# Patient Record
Sex: Female | Born: 1935 | Race: White | Hispanic: No | Marital: Married | Smoking: Never smoker
Health system: Southern US, Community
[De-identification: ages and names within clinical notes are randomized; demographics above are authoritative.]

## PROBLEM LIST (undated history)

## (undated) DIAGNOSIS — I8290 Acute embolism and thrombosis of unspecified vein: Secondary | ICD-10-CM

## (undated) DIAGNOSIS — E119 Type 2 diabetes mellitus without complications: Secondary | ICD-10-CM

## (undated) DIAGNOSIS — I10 Essential (primary) hypertension: Secondary | ICD-10-CM

## (undated) DIAGNOSIS — M1990 Unspecified osteoarthritis, unspecified site: Secondary | ICD-10-CM

## (undated) DIAGNOSIS — K589 Irritable bowel syndrome without diarrhea: Secondary | ICD-10-CM

## (undated) DIAGNOSIS — I219 Acute myocardial infarction, unspecified: Secondary | ICD-10-CM

## (undated) HISTORY — DX: Acute myocardial infarction, unspecified: I21.9

## (undated) HISTORY — PX: BACK SURGERY: SHX140

## (undated) HISTORY — PX: CHOLECYSTECTOMY: SHX55

## (undated) HISTORY — PX: COLONOSCOPY: SHX174

## (undated) HISTORY — PX: JOINT REPLACEMENT: SHX530

## (undated) HISTORY — PX: ABDOMINAL HYSTERECTOMY: SHX81

## (undated) HISTORY — PX: FOOT SURGERY: SHX648

## (undated) HISTORY — PX: TONSILLECTOMY: SUR1361

## (undated) HISTORY — DX: Essential (primary) hypertension: I10

## (undated) HISTORY — DX: Acute embolism and thrombosis of unspecified vein: I82.90

## (undated) HISTORY — PX: BREAST BIOPSY: SHX20

## (undated) HISTORY — DX: Irritable bowel syndrome, unspecified: K58.9

## (undated) HISTORY — DX: Type 2 diabetes mellitus without complications: E11.9

## (undated) HISTORY — PX: APPENDECTOMY: SHX54

---

## 1971-05-30 DIAGNOSIS — E119 Type 2 diabetes mellitus without complications: Secondary | ICD-10-CM

## 1971-05-30 HISTORY — DX: Type 2 diabetes mellitus without complications: E11.9

## 1982-05-29 HISTORY — PX: COLON SURGERY: SHX602

## 1986-05-29 DIAGNOSIS — I10 Essential (primary) hypertension: Secondary | ICD-10-CM

## 1986-05-29 HISTORY — DX: Essential (primary) hypertension: I10

## 2003-04-22 ENCOUNTER — Other Ambulatory Visit: Payer: Self-pay

## 2003-07-14 ENCOUNTER — Other Ambulatory Visit: Payer: Self-pay

## 2003-07-15 ENCOUNTER — Other Ambulatory Visit: Payer: Self-pay

## 2003-07-30 ENCOUNTER — Inpatient Hospital Stay (HOSPITAL_COMMUNITY): Admission: AD | Admit: 2003-07-30 | Discharge: 2003-08-10 | Payer: Self-pay | Admitting: Neurosurgery

## 2003-08-10 ENCOUNTER — Inpatient Hospital Stay (HOSPITAL_COMMUNITY)
Admission: RE | Admit: 2003-08-10 | Discharge: 2003-08-27 | Payer: Self-pay | Admitting: Physical Medicine & Rehabilitation

## 2004-11-04 IMAGING — CR DG RIBS W/ CHEST 3+V*L*
3 series · 3 of 3 positions shown · non-contrast
Comparison: 08/12/03.

CLINICAL DATA: Increase in chest wall pain after a fall.  
 CHEST WITH LEFT RIBS

[view not recorded (1 of 3)]
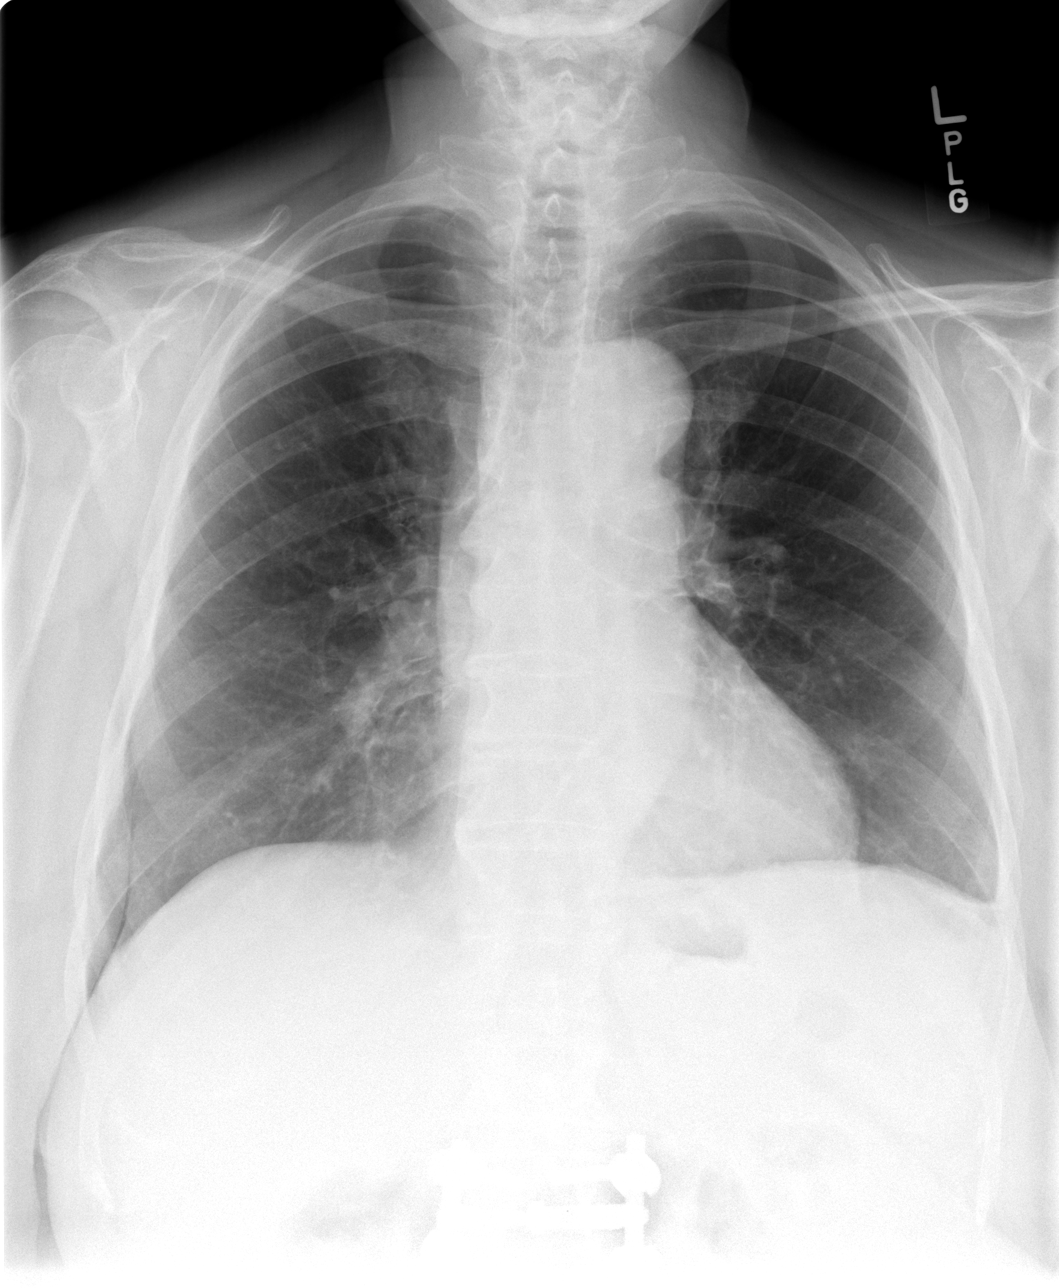

[view not recorded (2 of 3)]
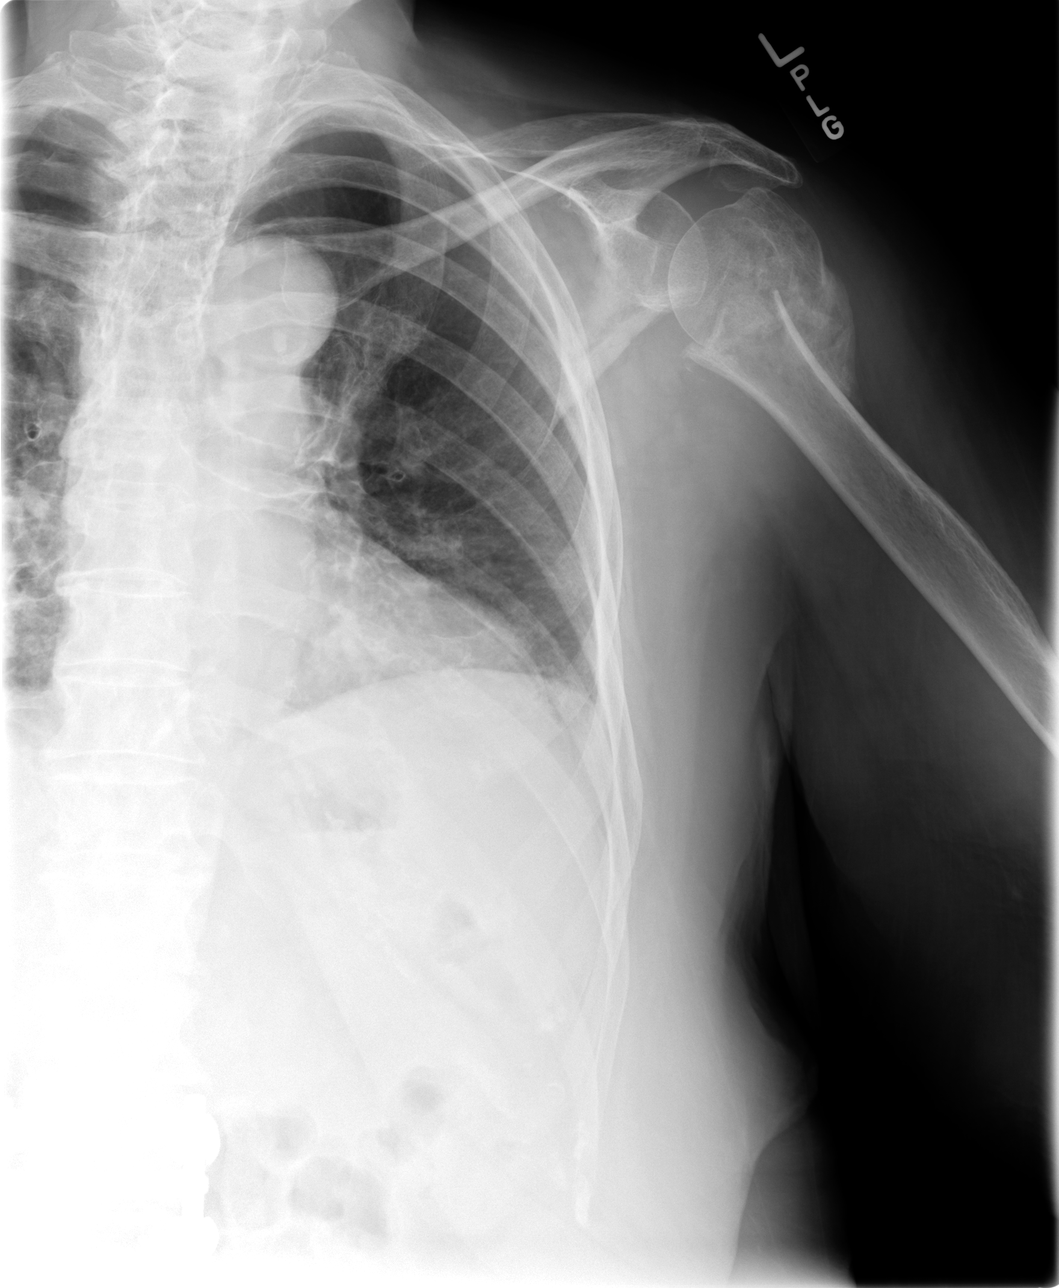

[view not recorded (3 of 3)]
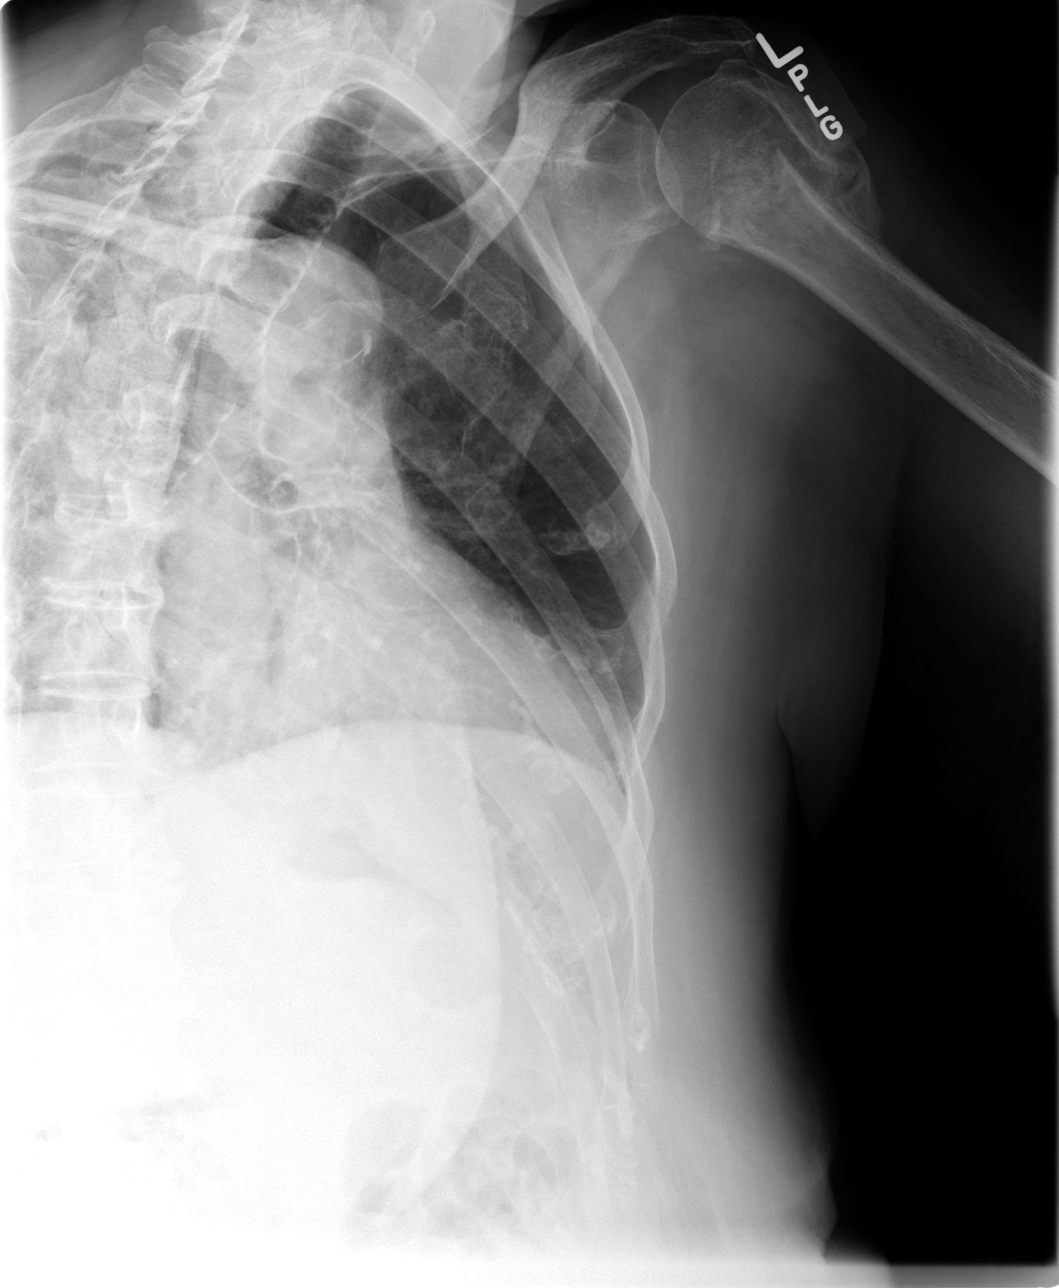

[3 of 3 positions shown; findings below may reference images not displayed]

Heart size is normal.  The mediastinum is unremarkable.  The lungs are clear except that there may be a small amount of pleural fluid on the left and minimal left base atelectasis.  No abnormality is seen in the right chest.  
 Rib details on the left show no definable fracture.  The patient is seen to have a healing fracture of the proximal humerus on the left.  
 IMPRESSION
 No rib fracture is seen.  The patient does have what I think is minimal atelectasis at the left base which raises concern about an occult rib fracture.

## 2005-04-19 ENCOUNTER — Ambulatory Visit: Payer: Self-pay | Admitting: Endocrinology

## 2005-07-31 ENCOUNTER — Inpatient Hospital Stay: Payer: Self-pay | Admitting: Endocrinology

## 2005-08-01 ENCOUNTER — Other Ambulatory Visit: Payer: Self-pay

## 2005-10-28 ENCOUNTER — Emergency Department: Payer: Self-pay | Admitting: Emergency Medicine

## 2005-10-29 ENCOUNTER — Other Ambulatory Visit: Payer: Self-pay

## 2006-01-18 ENCOUNTER — Emergency Department: Payer: Self-pay | Admitting: Emergency Medicine

## 2006-01-18 ENCOUNTER — Other Ambulatory Visit: Payer: Self-pay

## 2006-02-26 ENCOUNTER — Ambulatory Visit: Payer: Self-pay | Admitting: General Surgery

## 2006-04-22 ENCOUNTER — Emergency Department: Payer: Self-pay | Admitting: Emergency Medicine

## 2006-05-28 ENCOUNTER — Inpatient Hospital Stay: Payer: Self-pay | Admitting: Unknown Physician Specialty

## 2006-05-29 ENCOUNTER — Other Ambulatory Visit: Payer: Self-pay

## 2006-06-06 ENCOUNTER — Other Ambulatory Visit: Payer: Self-pay

## 2006-06-06 ENCOUNTER — Inpatient Hospital Stay: Payer: Self-pay | Admitting: Endocrinology

## 2006-06-17 ENCOUNTER — Encounter: Payer: Self-pay | Admitting: Internal Medicine

## 2006-06-29 ENCOUNTER — Encounter: Payer: Self-pay | Admitting: Endocrinology

## 2006-12-12 ENCOUNTER — Emergency Department: Payer: Self-pay | Admitting: Emergency Medicine

## 2007-02-27 ENCOUNTER — Inpatient Hospital Stay: Payer: Self-pay | Admitting: Endocrinology

## 2007-02-27 ENCOUNTER — Other Ambulatory Visit: Payer: Self-pay

## 2007-02-28 ENCOUNTER — Other Ambulatory Visit: Payer: Self-pay

## 2007-03-01 ENCOUNTER — Other Ambulatory Visit: Payer: Self-pay

## 2007-03-08 ENCOUNTER — Encounter: Payer: Self-pay | Admitting: Endocrinology

## 2007-03-30 ENCOUNTER — Encounter: Payer: Self-pay | Admitting: Endocrinology

## 2007-04-29 ENCOUNTER — Encounter: Payer: Self-pay | Admitting: Endocrinology

## 2007-05-30 ENCOUNTER — Encounter: Payer: Self-pay | Admitting: Endocrinology

## 2007-05-30 HISTORY — PX: FRACTURE SURGERY: SHX138

## 2007-08-09 ENCOUNTER — Emergency Department: Payer: Self-pay | Admitting: Emergency Medicine

## 2007-08-12 ENCOUNTER — Emergency Department: Payer: Self-pay | Admitting: Emergency Medicine

## 2008-01-01 ENCOUNTER — Other Ambulatory Visit: Payer: Self-pay

## 2008-01-02 ENCOUNTER — Inpatient Hospital Stay: Payer: Self-pay | Admitting: Endocrinology

## 2008-01-02 ENCOUNTER — Other Ambulatory Visit: Payer: Self-pay

## 2008-01-23 ENCOUNTER — Inpatient Hospital Stay: Payer: Self-pay | Admitting: Vascular Surgery

## 2008-01-23 ENCOUNTER — Other Ambulatory Visit: Payer: Self-pay

## 2008-01-30 ENCOUNTER — Ambulatory Visit: Payer: Self-pay | Admitting: Vascular Surgery

## 2008-02-02 ENCOUNTER — Emergency Department: Payer: Self-pay | Admitting: Emergency Medicine

## 2008-05-29 HISTORY — PX: CARPAL TUNNEL RELEASE: SHX101

## 2008-05-29 HISTORY — PX: FRACTURE SURGERY: SHX138

## 2008-07-31 ENCOUNTER — Ambulatory Visit: Payer: Self-pay | Admitting: Endocrinology

## 2009-02-26 ENCOUNTER — Ambulatory Visit: Payer: Self-pay | Admitting: Specialist

## 2009-04-07 IMAGING — CT CT ANGIO EXTREM LOW BILAT
1 of 3 series · 11 of 32 positions shown, 16 images · non-contrast
Comparison: none

REASON FOR EXAM: lle pain and numbness
COMMENTS:

[Series 11: upperrunoff 5.0 upper · axial · 0.84mm/px · z∈[-539,+176]mm · 11 of 167 slices shown, 16 images]
[im 12/167  soft-tissue]
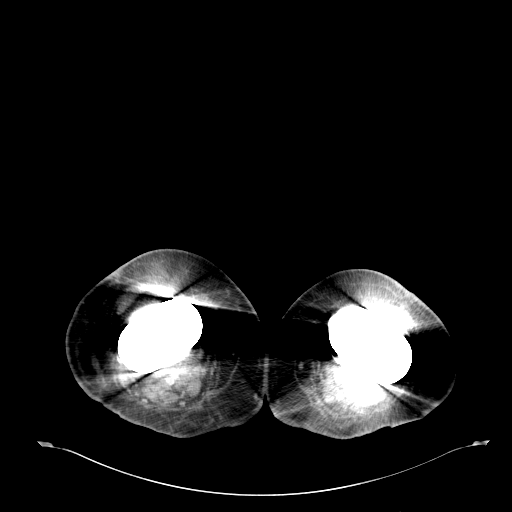
[im 12/167  bone]
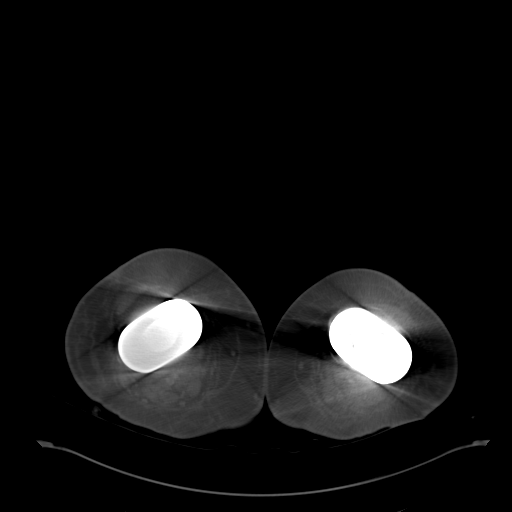
[im 34/167  soft-tissue]
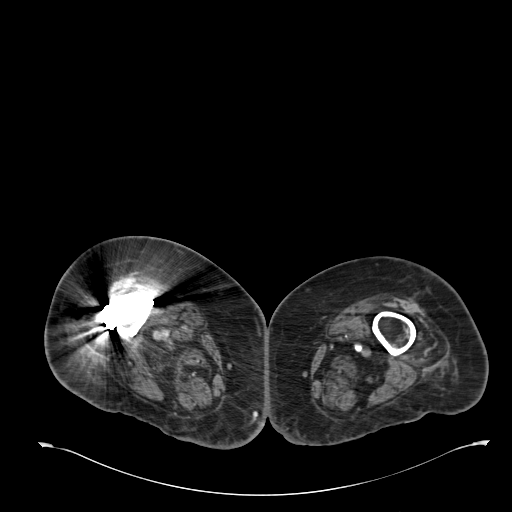
[im 45/167  soft-tissue]
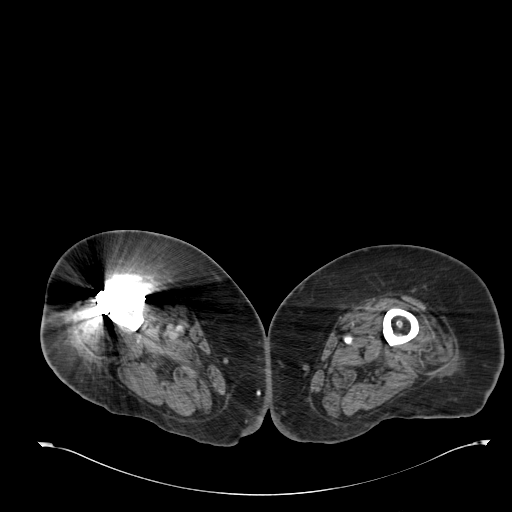
[im 56/167  soft-tissue]
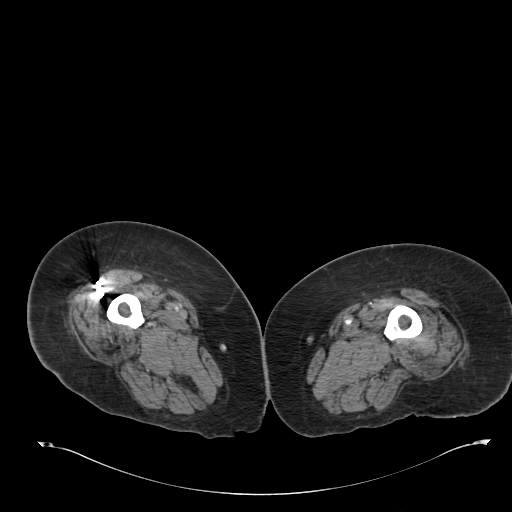
[im 78/167  soft-tissue]
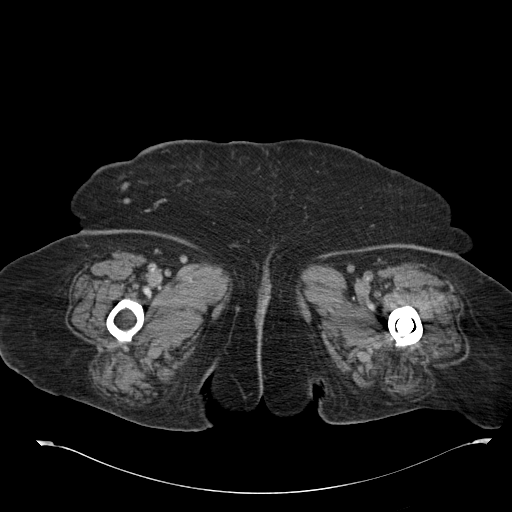
[im 89/167  soft-tissue]
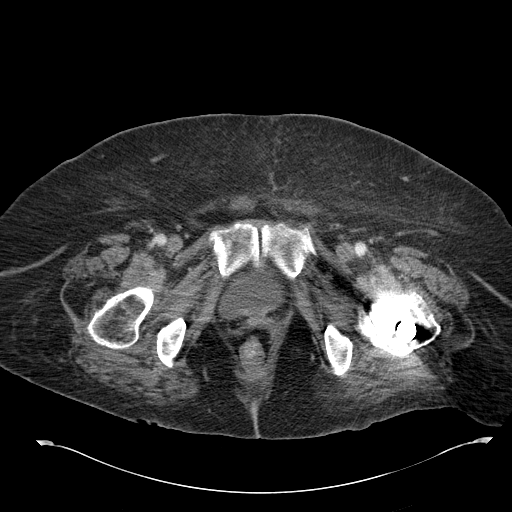
[im 111/167  soft-tissue]
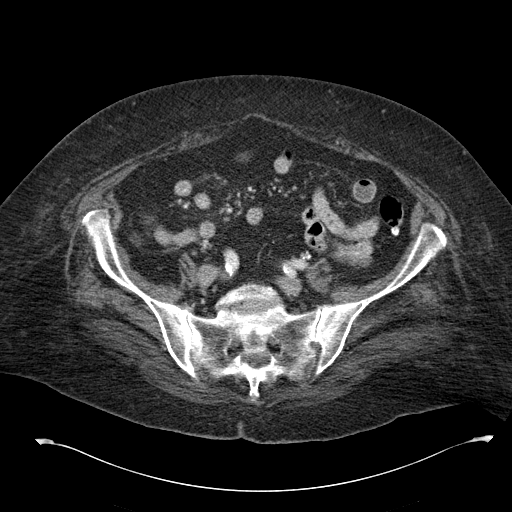
[im 122/167  soft-tissue]
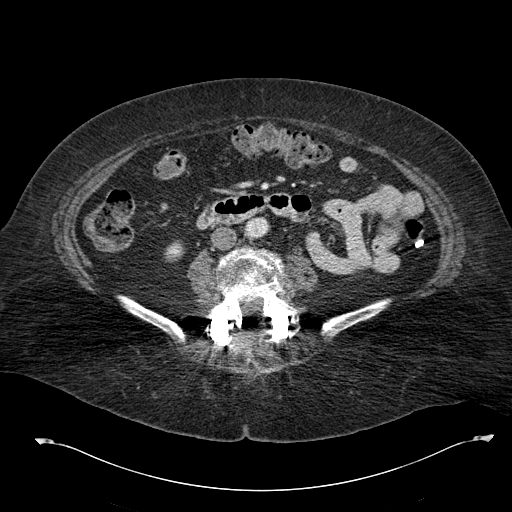
[im 122/167  lung]
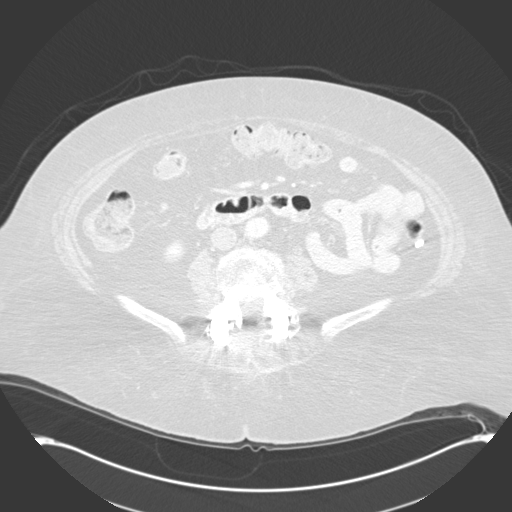
[im 133/167  soft-tissue]
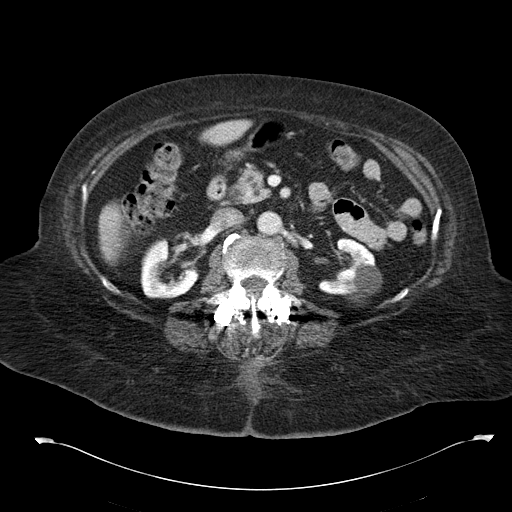
[im 133/167  lung]
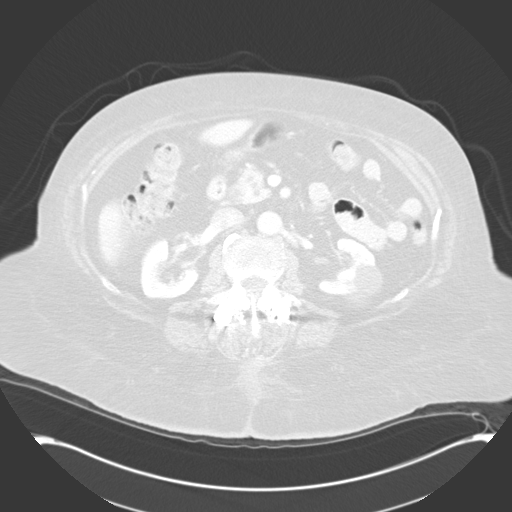
[im 133/167  bone]
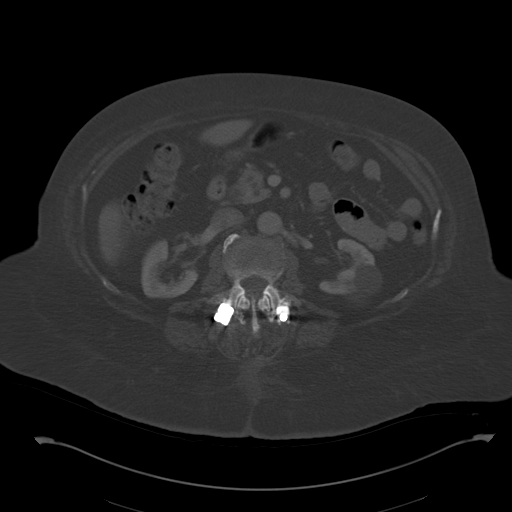
[im 144/167  lung]
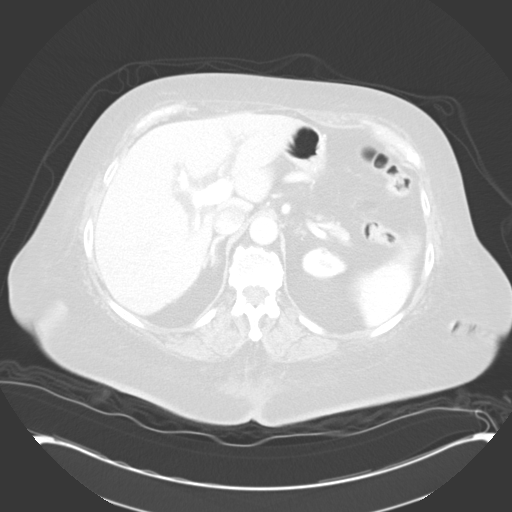
[im 155/167  soft-tissue]
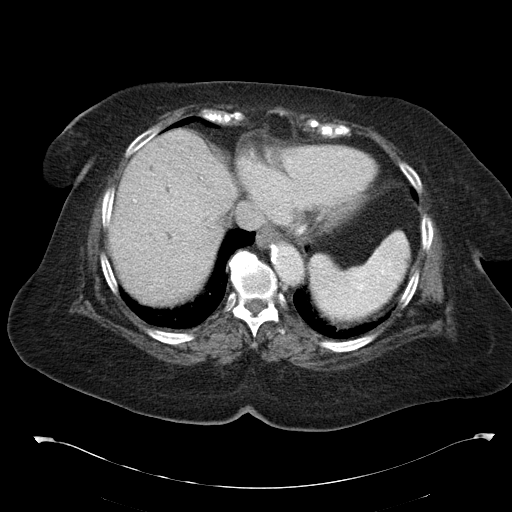
[im 155/167  lung]
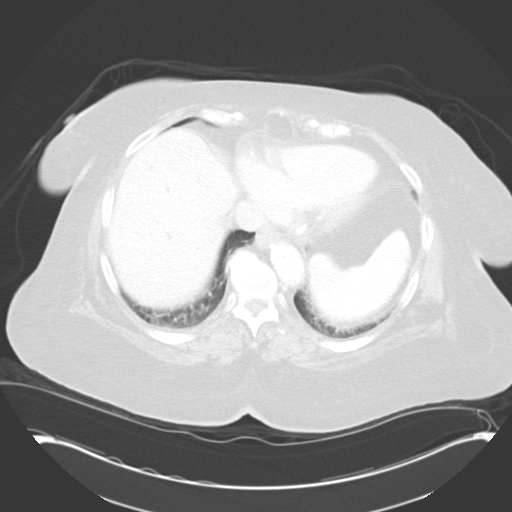

[11 of 32 positions shown; findings below may reference images not displayed]

PROCEDURE:     CT  - CT ANGIOGRAPHY LOWER EXTR W/WO  - January 23, 2008  [DATE]

RESULT:     Axial source imaging, 2-D maximum intensity projections, and 3-D
reconstructions were performed of the lower abdominal aorta and RIGHT and
LEFT lower extremities.

This study is markedly degraded by artifact from a LEFT hip intramedullary
rod and sliding lag screw placement, lateral buttress plate with cannulated
screw fixation of the distal femur, and bilateral knee prostheses.

CT abdomen and pelvis portion of the study demonstrates intrahepatic and
extrahepatic biliary ductal dilatation without evidence of liver masses nor
regions of abnormal enhancement. Focal, low attenuating areas are
appreciated within the spleen possibly represent a cyst. There appears to be
a large cyst involving the upper to midpole region of the RIGHT kidney with
a smaller cyst along the posterior aspect. No further masses are identified.
A small low attenuating area is identified within the RIGHT kidney likely
representing a cyst. The pancreas appears unremarkable. Evaluation of the
abdominal aorta demonstrates no evidence of aneurysmal dilatation. The
celiac and SMA appears to be opacified. The common iliacs are opacified.
There is an area concerning for high-grade stenosis involving the internal
iliac on the RIGHT as well as the distal external iliac. There appears to be
diffuse atherosclerotic disease within the profunda femoral and superficial
femoral artery which appears to be severe in the superficial femoral as well
as within the proximal portion of the profunda femoral. There appears to be
complete occlusion of the proximal superficial femoral and profunda femoral
arteries on the RIGHT. The distal profunda femoral evaluation is limited as
described above. Popliteal evaluation is obscured due to bilateral knee
prostheses. Evaluation of the LEFT demonstrates patency of the common iliac
with no evidence of flow in the internal iliac. There appears to complete
occlusion of the distal external iliac and common femoral though this
finding is questionable due to metallic streak artifact from the patient's
intramedullary rod. This area is still concerning for high-grade
stenosis/complete occlusion. There appears to be reconstitution of the
superficial and profunda femoral arteries with severe atherosclerotic
disease involving the profunda femoral and moderate involving the
superficial femoral artery which appears to be patent to the level of the
knee. The mid popliteal artery is obscured again by metallic streak artifact
from the patient's knee replacements. Evaluation of the trifurcation vessels
on the RIGHT demonstrates severe atherosclerotic disease involving the
anterior tibial and peroneal arteries with an area of high-grade
stenosis/occlusion proximally within the anterior tibial and diffuse severe
atherosclerotic disease throughout the peroneal artery. The posterior tibial
artery demonstrates diffuse moderate to severe atherosclerotic disease which
appears to be partially patent through the level of the ankle. The
trifurcation vessels on the LEFT demonstrate severe atherosclerotic disease
involving the anterior tibial, peroneal and posterior tibial arteries with
areas of complete occlusion proximally in the peroneal along the mid portion
of the anterior tibial and distally within the posterior tibial.
IMPRESSION: 1.Findings concerning for severe atherosclerotic disease involving the
distal common iliacs, internal iliacs and the proximal and distal profunda
femoral on the LEFT and proximal superficial femoral on the LEFT. There
appears to be diffuse severe atherosclerotic disease involving the
superficial and profunda femoral arteries on the RIGHT as well as the
internal iliacs on the RIGHT and LEFT. The patient has received vascular
interventional consultation and further evaluation with catheter-directed
angiography is recommended if clinically warranted due to the significant
amount of metallic artifact.

2.Evaluation markedly degraded by prosthetic devices as described above.
There is severe atherosclerotic disease within the trifurcation vessels with
complete occlusion of the trifurcation levels on the LEFT either along their
midportion proximally or distally. On the RIGHT severe atherosclerotic
disease is appreciated and the posterior tibial appears to be patent though
the level of the ankle. The peroneal artery demonstrates reconstitution
through the level of the ankle with the anterior tibial artery completely
occluded.

## 2009-07-19 ENCOUNTER — Ambulatory Visit: Payer: Self-pay | Admitting: General Surgery

## 2009-09-15 ENCOUNTER — Inpatient Hospital Stay: Payer: Self-pay | Admitting: Endocrinology

## 2009-09-27 ENCOUNTER — Emergency Department: Payer: Self-pay | Admitting: Emergency Medicine

## 2009-11-02 ENCOUNTER — Inpatient Hospital Stay: Payer: Self-pay | Admitting: Endocrinology

## 2009-11-06 ENCOUNTER — Encounter: Payer: Self-pay | Admitting: Internal Medicine

## 2009-11-18 ENCOUNTER — Encounter: Payer: Self-pay | Admitting: Endocrinology

## 2009-11-26 ENCOUNTER — Encounter: Payer: Self-pay | Admitting: Endocrinology

## 2009-12-27 ENCOUNTER — Encounter: Payer: Self-pay | Admitting: Endocrinology

## 2010-01-27 ENCOUNTER — Encounter: Payer: Self-pay | Admitting: Endocrinology

## 2010-02-26 ENCOUNTER — Encounter: Payer: Self-pay | Admitting: Endocrinology

## 2010-03-29 ENCOUNTER — Encounter: Payer: Self-pay | Admitting: Endocrinology

## 2010-04-28 ENCOUNTER — Encounter: Payer: Self-pay | Admitting: Endocrinology

## 2010-05-29 ENCOUNTER — Encounter: Payer: Self-pay | Admitting: Endocrinology

## 2010-06-29 ENCOUNTER — Encounter: Payer: Self-pay | Admitting: Endocrinology

## 2011-01-05 ENCOUNTER — Inpatient Hospital Stay: Payer: Self-pay | Admitting: Endocrinology

## 2011-01-13 ENCOUNTER — Inpatient Hospital Stay: Payer: Self-pay | Admitting: Internal Medicine

## 2011-01-17 ENCOUNTER — Encounter: Payer: Self-pay | Admitting: Internal Medicine

## 2011-01-28 ENCOUNTER — Encounter: Payer: Self-pay | Admitting: Internal Medicine

## 2011-02-27 ENCOUNTER — Encounter: Payer: Self-pay | Admitting: Internal Medicine

## 2011-05-30 DIAGNOSIS — I8290 Acute embolism and thrombosis of unspecified vein: Secondary | ICD-10-CM

## 2011-05-30 HISTORY — PX: PACEMAKER PLACEMENT: SHX43

## 2011-05-30 HISTORY — DX: Acute embolism and thrombosis of unspecified vein: I82.90

## 2011-08-23 ENCOUNTER — Ambulatory Visit: Payer: Self-pay | Admitting: Endocrinology

## 2012-05-12 ENCOUNTER — Observation Stay: Payer: Self-pay | Admitting: Internal Medicine

## 2012-05-12 LAB — CK TOTAL AND CKMB (NOT AT ARMC)
CK, Total: 31 U/L (ref 21–215)
CK, Total: 47 U/L (ref 21–215)
CK, Total: 27 U/L (ref 21–215)
CK-MB: 1 ng/mL (ref 0.5–3.6)
CK-MB: 1 ng/mL (ref 0.5–3.6)
CK-MB: 1.2 ng/mL (ref 0.5–3.6)

## 2012-05-12 LAB — URINALYSIS, COMPLETE
Bacteria: NONE SEEN
Bilirubin,UR: NEGATIVE
Glucose,UR: NEGATIVE mg/dL (ref 0–75)
Ketone: NEGATIVE
Leukocyte Esterase: NEGATIVE
Ph: 6 (ref 4.5–8.0)
Protein: 30
RBC,UR: 1 /HPF (ref 0–5)
Specific Gravity: 1.019 (ref 1.003–1.030)
Squamous Epithelial: <1

## 2012-05-12 LAB — PROTIME-INR
INR: 1.1
Prothrombin Time: 14.5 secs (ref 11.5–14.7)

## 2012-05-12 LAB — COMPREHENSIVE METABOLIC PANEL
BUN: 21 mg/dL — ABNORMAL HIGH (ref 7–18)
Chloride: 108 mmol/L — ABNORMAL HIGH (ref 98–107)
Co2: 27 mmol/L (ref 21–32)
EGFR (African American): 60 — ABNORMAL LOW
EGFR (Non-African Amer.): 52 — ABNORMAL LOW
Glucose: 151 mg/dL — ABNORMAL HIGH (ref 65–99)
SGOT(AST): 18 U/L (ref 15–37)
SGPT (ALT): 18 U/L (ref 12–78)
Total Protein: 6.8 g/dL (ref 6.4–8.2)

## 2012-05-12 LAB — CBC
HCT: 43.2 % (ref 35.0–47.0)
MCH: 30.8 pg (ref 26.0–34.0)
MCHC: 32.7 g/dL (ref 32.0–36.0)
MCV: 94 fL (ref 80–100)
Platelet: 115 10*3/uL — ABNORMAL LOW (ref 150–440)
RBC: 4.58 10*6/uL (ref 3.80–5.20)

## 2012-05-12 LAB — TROPONIN I
Troponin-I: <0.02 ng/mL
Troponin-I: <0.02 ng/mL

## 2012-05-13 LAB — BASIC METABOLIC PANEL
Chloride: 107 mmol/L (ref 98–107)
Co2: 27 mmol/L (ref 21–32)
Creatinine: 1 mg/dL (ref 0.60–1.30)
EGFR (African American): 58 — ABNORMAL LOW
Potassium: 3.9 mmol/L (ref 3.5–5.1)
Sodium: 140 mmol/L (ref 136–145)

## 2012-05-13 LAB — CBC WITH DIFFERENTIAL/PLATELET
Basophil #: 0 10*3/uL (ref 0.0–0.1)
Basophil %: 0.4 %
Eosinophil #: 0.1 10*3/uL (ref 0.0–0.7)
Eosinophil %: 1.7 %
HGB: 13.7 g/dL (ref 12.0–16.0)
Lymphocyte %: 9.7 %
MCHC: 32.9 g/dL (ref 32.0–36.0)
Monocyte #: 0.5 x10 3/mm (ref 0.2–0.9)
Neutrophil %: 80.6 %
RBC: 4.39 10*6/uL (ref 3.80–5.20)
WBC: 6.8 10*3/uL (ref 3.6–11.0)

## 2012-05-13 LAB — PROTIME-INR: INR: 1.1

## 2012-11-26 ENCOUNTER — Encounter: Payer: Self-pay | Admitting: General Surgery

## 2012-12-18 ENCOUNTER — Other Ambulatory Visit: Payer: Self-pay | Admitting: *Deleted

## 2012-12-18 ENCOUNTER — Ambulatory Visit (INDEPENDENT_AMBULATORY_CARE_PROVIDER_SITE_OTHER): Payer: Medicare Other | Admitting: General Surgery

## 2012-12-18 ENCOUNTER — Encounter: Payer: Self-pay | Admitting: General Surgery

## 2012-12-18 VITALS — BP 120/80 | HR 88 | Resp 16 | Ht 60.0 in | Wt 234.0 lb

## 2012-12-18 DIAGNOSIS — Z1231 Encounter for screening mammogram for malignant neoplasm of breast: Secondary | ICD-10-CM

## 2012-12-18 DIAGNOSIS — N63 Unspecified lump in unspecified breast: Secondary | ICD-10-CM

## 2012-12-18 NOTE — Progress Notes (Signed)
Patient ID: Debra Butler, female   DOB: [birth year], 79 y.o.   MRN: [id number]  Chief Complaint  Patient presents with  . Other    discuss mammogram    HP Debra Butler is a 79 y.o. female who presents for a mammogram discussion.  Mammogram was 2011 but she did not have a mammogram in 2012 due to her daughter passing away. States she felt a lump in right breast for 3 weeks. Denies breast pain. No family history of breast cancer. Her left breast biopsy was at age 32 and benign.  HPI  Past Medical History  Diagnosis Date  . Hypertension 1988  . Diabetes mellitus without complication 1973  . Myocardial infarction   . IBS (irritable bowel syndrome)     Past Surgical History  Procedure Laterality Date  . Fracture surgery  2010    hip  . Fracture surgery  2009    femur  . Foot surgery    . Carpal tunnel release Right 2010  . Abdominal hysterectomy      Total  . Breast biopsy      at age 80, benign  . Colon surgery  1984  . Cholecystectomy    . Appendectomy    . Pacemaker placement  2013  . Tonsillectomy    . Back surgery    . Joint replacement      x 2    History reviewed. No pertinent family history.  Social History History  Substance Use Topics  . Smoking status: Never Smoker   . Smokeless tobacco: Never Used  . Alcohol Use: No    Allergies  Allergen Reactions  . Sulphur (Sulfur)     No current outpatient prescriptions on file.   No current facility-administered medications for this visit.    Review of Systems Review of Systems  Constitutional: Negative.   Respiratory: Negative.   Cardiovascular: Negative.     Blood pressure 120/80, pulse 88, resp. rate 16, height 5' (1.524 m), weight 234 lb (106.142 kg).  Physical Exam Physical Exam  Constitutional: She is oriented to person, place, and time. She appears well-developed and well-nourished.  Eyes: Conjunctivae are normal.  Neck: Neck supple.  Cardiovascular: Normal rate.  An irregular rhythm  present.  Pulmonary/Chest: Effort normal and breath sounds normal. Right breast exhibits mass. Right breast exhibits no inverted nipple, no nipple discharge, no skin change and no tenderness. Left breast exhibits mass. Left breast exhibits no inverted nipple, no nipple discharge, no skin change and no tenderness.  Neurological: She is alert and oriented to person, place, and time.  Skin: Skin is warm and dry.  12 o'clock soft superficial ill defined mass right breast  12 o'clock soft superficial ill defined mass left breast  Data Reviewed Previous notes  Assessment    Lipoma right breast, patient reassured    Plan    Patient to have a bilateral screening mammogram at the [facility] on 12-23-12 at 3:40 pm. She is aware of date, time, and instructions.   Follow up in one year with office visit only,assuming her mammogram is normal.        Osten Janek G 12/18/2012, 6:52 PM

## 2012-12-18 NOTE — Progress Notes (Signed)
Patient to have a bilateral screening mammogram at the [facility] on 12-23-12 at 3:40 pm. She is aware of date, time, and instructions.   Follow up in one year with office visit only.

## 2012-12-18 NOTE — Patient Instructions (Addendum)
Patient to have a bilateral screening mammogram at the [facility] on 12-23-12 at 3:40 pm. She is aware of date, time, and instructions.   Follow up in one year with office visit only. 

## 2012-12-23 ENCOUNTER — Ambulatory Visit: Payer: Self-pay | Admitting: General Surgery

## 2012-12-26 ENCOUNTER — Telehealth: Payer: Self-pay | Admitting: *Deleted

## 2012-12-26 NOTE — Telephone Encounter (Signed)
Message copied by Currie Paris on Thu Dec 26, 2012  8:40 AM ------      Message from: Kieth Brightly      Created: Wed Dec 25, 2012  6:37 PM       Mammogram is normal. Please advise pt. She is to f/u in 1 yr as recommended or sooner prn. ------

## 2012-12-26 NOTE — Telephone Encounter (Signed)
Notified patient as instructed, patient pleased. Discussed follow-up appointments, patient agrees  

## 2013-02-04 ENCOUNTER — Ambulatory Visit: Payer: Self-pay | Admitting: Otolaryngology

## 2013-02-23 ENCOUNTER — Emergency Department: Payer: Self-pay | Admitting: Emergency Medicine

## 2013-02-23 LAB — CBC
MCHC: 33.3 g/dL (ref 32.0–36.0)
MCV: 95 fL (ref 80–100)
RBC: 4.68 10*6/uL (ref 3.80–5.20)
WBC: 5.4 10*3/uL (ref 3.6–11.0)

## 2013-02-23 LAB — PROTIME-INR: INR: 1.9

## 2013-12-22 ENCOUNTER — Ambulatory Visit (INDEPENDENT_AMBULATORY_CARE_PROVIDER_SITE_OTHER): Payer: Medicare Other | Admitting: General Surgery

## 2013-12-22 ENCOUNTER — Encounter: Payer: Self-pay | Admitting: General Surgery

## 2013-12-22 ENCOUNTER — Ambulatory Visit: Payer: Self-pay | Admitting: General Surgery

## 2013-12-22 VITALS — BP 130/80 | HR 70 | Resp 12 | Ht 60.0 in | Wt 232.0 lb

## 2013-12-22 DIAGNOSIS — N63 Unspecified lump in unspecified breast: Secondary | ICD-10-CM

## 2013-12-22 NOTE — Patient Instructions (Addendum)
Patient to return as needed. Continue self breast exams. Call office for any new breast issues or concerns.'  

## 2013-12-22 NOTE — Progress Notes (Signed)
Patient ID: Debra Butler, female   DOB: Nov 10, 1921, 80 y.o.   MRN: [id number]  Chief Complaint  Patient presents with  . Follow-up    breast     HPI Debra Butler is a 80 y.o. female here today for her one year breast check. Patient states she is doing well. Patient had a Lipoma right breast one year ago. Denies breast pain. No family history of breast cancer. Her left breast biopsy was at age 43 and benign.       HPI  Past Medical History  Diagnosis Date  . Hypertension 1988  . Diabetes mellitus without complication 9326  . Myocardial infarction   . IBS (irritable bowel syndrome)     Past Surgical History  Procedure Laterality Date  . Fracture surgery  2010    hip  . Fracture surgery  2009    femur  . Foot surgery    . Carpal tunnel release Right 2010  . Abdominal hysterectomy      Total  . Breast biopsy      at age 43, benign  . Colon surgery  1984  . Cholecystectomy    . Appendectomy    . Pacemaker placement  2013  . Tonsillectomy    . Back surgery    . Joint replacement      x 2    History reviewed. No pertinent family history.  Social History History  Substance Use Topics  . Smoking status: Never Smoker   . Smokeless tobacco: Never Used  . Alcohol Use: No    Allergies  Allergen Reactions  . Sulphur [Sulfur]     Current Outpatient Prescriptions  Medication Sig Dispense Refill  . aspirin 81 MG tablet Take 81 mg by mouth daily.      Marland Kitchen diltiazem (DILACOR XR) 180 MG 24 hr capsule Take 180 mg by mouth daily.      . furosemide (LASIX) 20 MG tablet Take 20 mg by mouth daily.      Marland Kitchen levothyroxine (SYNTHROID, LEVOTHROID) 112 MCG tablet Take 112 mcg by mouth daily before breakfast.      . metFORMIN (GLUCOPHAGE) 500 MG tablet Take 500 mg by mouth 2 (two) times daily with a meal.      . nitroGLYCERIN 2.5 MG CR capsule Take 2.5 mg by mouth as needed.      . potassium chloride (KLOR-CON) 20 MEQ packet Take 20 mEq by mouth 2 (two) times daily.      .  pregabalin (LYRICA) 100 MG capsule Take 100 mg by mouth 2 (two) times daily.      . sitaGLIPtin (JANUVIA) 100 MG tablet Take 100 mg by mouth daily.      . solifenacin (VESICARE) 5 MG tablet Take 5 mg by mouth daily.      Marland Kitchen warfarin (COUMADIN) 6 MG tablet Take 6 mg by mouth one time only at 6 PM.       No current facility-administered medications for this visit.    Review of Systems Review of Systems  Blood pressure 130/80, pulse 70, resp. rate 12, height 5' (1.524 m), weight 232 lb (105.235 kg).  Physical Exam Physical Exam  Constitutional: She is oriented to person, place, and time. She appears well-developed and well-nourished.  Eyes: Conjunctivae are normal. No scleral icterus.  Neck: Neck supple.  Cardiovascular: Normal rate and normal heart sounds.   Pulmonary/Chest: Breath sounds normal. Right breast exhibits no inverted nipple, no mass, no nipple discharge, no skin change and no  tenderness. Left breast exhibits no inverted nipple, no mass, no nipple discharge, no skin change and no tenderness.  Lymphadenopathy:    She has no cervical adenopathy.    She has no axillary adenopathy.  Neurological: She is alert and oriented to person, place, and time.  Skin: Skin is warm and dry.    Data Reviewed None   Assessment    Exam stable . No palpable findings in the right breast       Plan    Patient to return as needed.Continue self breast exams. Call office for any new breast issues or concerns.       Valli Randol G 12/24/2013, 6:11 AM

## 2013-12-24 ENCOUNTER — Encounter: Payer: Self-pay | Admitting: General Surgery

## 2014-03-30 ENCOUNTER — Encounter: Payer: Self-pay | Admitting: General Surgery

## 2014-03-30 ENCOUNTER — Observation Stay: Payer: Self-pay | Admitting: Internal Medicine

## 2014-03-30 LAB — CBC
HCT: 45.5 % (ref 35.0–47.0)
HGB: 14.6 g/dL (ref 12.0–16.0)
MCH: 31.3 pg (ref 26.0–34.0)
MCHC: 32 g/dL (ref 32.0–36.0)
MCV: 98 fL (ref 80–100)
PLATELETS: 112 10*3/uL — AB (ref 150–440)
RBC: 4.65 10*6/uL (ref 3.80–5.20)
RDW: 15.5 % — ABNORMAL HIGH (ref 11.5–14.5)
WBC: 8.4 10*3/uL (ref 3.6–11.0)

## 2014-03-30 LAB — URINALYSIS, COMPLETE
Bacteria: NONE SEEN
Bilirubin,UR: NEGATIVE
Blood: NEGATIVE
Glucose,UR: NEGATIVE mg/dL (ref 0–75)
Ketone: NEGATIVE
LEUKOCYTE ESTERASE: NEGATIVE
Nitrite: NEGATIVE
PROTEIN: NEGATIVE
Ph: 7 (ref 4.5–8.0)
RBC,UR: 1 /HPF (ref 0–5)
SPECIFIC GRAVITY: 1.009 (ref 1.003–1.030)
Squamous Epithelial: <1

## 2014-03-30 LAB — BASIC METABOLIC PANEL
Anion Gap: 9 (ref 7–16)
BUN: 15 mg/dL (ref 7–18)
CHLORIDE: 104 mmol/L (ref 98–107)
CO2: 28 mmol/L (ref 21–32)
Calcium, Total: 8.5 mg/dL (ref 8.5–10.1)
Creatinine: 1 mg/dL (ref 0.60–1.30)
EGFR (African American): >60
EGFR (Non-African Amer.): 55 — ABNORMAL LOW
GLUCOSE: 132 mg/dL — AB (ref 65–99)
Osmolality: 284 (ref 275–301)
Potassium: 5.2 mmol/L — ABNORMAL HIGH (ref 3.5–5.1)
SODIUM: 141 mmol/L (ref 136–145)

## 2014-03-30 LAB — TROPONIN I: Troponin-I: <0.02 ng/mL

## 2014-03-30 LAB — PROTIME-INR
INR: 4
Prothrombin Time: 37.7 secs — ABNORMAL HIGH (ref 11.5–14.7)

## 2014-03-31 LAB — BASIC METABOLIC PANEL
Anion Gap: 5 — ABNORMAL LOW (ref 7–16)
BUN: 17 mg/dL (ref 7–18)
Calcium, Total: 9 mg/dL (ref 8.5–10.1)
Chloride: 102 mmol/L (ref 98–107)
Co2: 32 mmol/L (ref 21–32)
Creatinine: 1.06 mg/dL (ref 0.60–1.30)
GFR CALC NON AF AMER: 52 — AB
GLUCOSE: 138 mg/dL — AB (ref 65–99)
Osmolality: 281 (ref 275–301)
Potassium: 4.4 mmol/L (ref 3.5–5.1)
Sodium: 139 mmol/L (ref 136–145)

## 2014-03-31 LAB — PROTIME-INR
INR: 3.4
Prothrombin Time: 33.5 secs — ABNORMAL HIGH (ref 11.5–14.7)

## 2014-03-31 LAB — CBC WITH DIFFERENTIAL/PLATELET
Basophil #: 0 10*3/uL (ref 0.0–0.1)
Basophil %: 0.4 %
Eosinophil #: 0 10*3/uL (ref 0.0–0.7)
Eosinophil %: 0.4 %
HCT: 42.5 % (ref 35.0–47.0)
HGB: 13.9 g/dL (ref 12.0–16.0)
LYMPHS PCT: 8.6 %
Lymphocyte #: 0.7 10*3/uL — ABNORMAL LOW (ref 1.0–3.6)
MCH: 31.8 pg (ref 26.0–34.0)
MCHC: 32.6 g/dL (ref 32.0–36.0)
MCV: 97 fL (ref 80–100)
Monocyte #: 0.5 x10 3/mm (ref 0.2–0.9)
Monocyte %: 6.6 %
Neutrophil #: 6.8 10*3/uL — ABNORMAL HIGH (ref 1.4–6.5)
Neutrophil %: 84 %
PLATELETS: 110 10*3/uL — AB (ref 150–440)
RBC: 4.36 10*6/uL (ref 3.80–5.20)
RDW: 15.4 % — AB (ref 11.5–14.5)
WBC: 8.1 10*3/uL (ref 3.6–11.0)

## 2014-05-29 HISTORY — PX: UPPER GI ENDOSCOPY: SHX6162

## 2014-06-16 ENCOUNTER — Ambulatory Visit (INDEPENDENT_AMBULATORY_CARE_PROVIDER_SITE_OTHER): Payer: Medicare Other | Admitting: General Surgery

## 2014-06-16 ENCOUNTER — Encounter: Payer: Self-pay | Admitting: General Surgery

## 2014-06-16 VITALS — BP 130/80 | HR 76 | Resp 14 | Ht 60.0 in | Wt 227.0 lb

## 2014-06-16 DIAGNOSIS — K625 Hemorrhage of anus and rectum: Secondary | ICD-10-CM

## 2014-06-16 MED ORDER — POLYETHYLENE GLYCOL 3350 17 GM/SCOOP PO POWD: 1.0000 | Freq: Once | ORAL | Status: DC

## 2014-06-16 NOTE — Patient Instructions (Addendum)
Colonoscopy A colonoscopy is an exam to look at the entire large intestine (colon). This exam can help find problems such as tumors, polyps, inflammation, and areas of bleeding. The exam takes about 1 hour.  LET [facility] CARE PROVIDER KNOW ABOUT:   Any allergies you have.  All medicines you are taking, including vitamins, herbs, eye drops, creams, and over-the-counter medicines.  Previous problems you or members of your family have had with the use of anesthetics.  Any blood disorders you have.  Previous surgeries you have had.  Medical conditions you have. RISKS AND COMPLICATIONS  Generally, this is a safe procedure. However, as with any procedure, complications can occur. Possible complications include:  Bleeding.  Tearing or rupture of the colon wall.  Reaction to medicines given during the exam.  Infection (rare). BEFORE THE PROCEDURE   Ask your health care provider about changing or stopping your regular medicines.  You may be prescribed an oral bowel prep. This involves drinking a large amount of medicated liquid, starting the day before your procedure. The liquid will cause you to have multiple loose stools until your stool is almost clear or light green. This cleans out your colon in preparation for the procedure.  Do not eat or drink anything else once you have started the bowel prep, unless your health care provider tells you it is safe to do so.  Arrange for someone to drive you home after the procedure. PROCEDURE   You will be given medicine to help you relax (sedative).  You will lie on your side with your knees bent.  A long, flexible tube with a light and camera on the end (colonoscope) will be inserted through the rectum and into the colon. The camera sends video back to a computer screen as it moves through the colon. The colonoscope also releases carbon dioxide gas to inflate the colon. This helps your health care provider see the area better.  During  the exam, your health care provider may take a small tissue sample (biopsy) to be examined under a microscope if any abnormalities are found.  The exam is finished when the entire colon has been viewed. AFTER THE PROCEDURE   Do not drive for 24 hours after the exam.  You may have a small amount of blood in your stool.  You may pass moderate amounts of gas and have mild abdominal cramping or bloating. This is caused by the gas used to inflate your colon during the exam.  Ask when your test results will be ready and how you will get your results. Make sure you get your test results. Document Released: 05/12/2000 Document Revised: 03/05/2013 Document Reviewed: 01/20/2013 [facility] Patient Information 2015 [place], [place]. This information is not intended to replace advice given to you by your health care provider. Make sure you discuss any questions you have with your health care provider.  Patient is scheduled for and upper and lower endoscopy at [facility] on 07/01/14. She is aware to pre register with the hospital. She will stop her Coumadin 5 days prior. She will hold her Metformin the day of prep and procedure. She will only take her Diltiazem at 6 am with a small sip of water. Miralax has been sent into her pharmacy. Patient is aware of date and instructions.

## 2014-06-16 NOTE — Progress Notes (Signed)
Patient ID: Debra Butler, female   DOB: 07/05/1921, 81 y.o.   MRN: [id number]  Chief Complaint  Patient presents with  . Rectal Bleeding    HPI Debra Butler is a 81 y.o. female here today for a evaluation bloody stools. Patient states this have been going on for three months now. The blood is on the paper and in the bowel.  She states she has been having black tarry tools often times  and this started after Thanksgiving. Last colonoscopy was done in 2007-normal. No abdominal symptoms otherwise. HPI  Past Medical History  Diagnosis Date  . Hypertension 1988  . Diabetes mellitus without complication 3976  . Myocardial infarction   . IBS (irritable bowel syndrome)   . Blood clot in vein 2013    left leg    Past Surgical History  Procedure Laterality Date  . Fracture surgery  2010    hip  . Fracture surgery  2009    femur  . Foot surgery    . Carpal tunnel release Right 2010  . Abdominal hysterectomy      Total  . Breast biopsy      at age 57, benign  . Colon surgery  1984  . Cholecystectomy    . Appendectomy    . Pacemaker placement  2013  . Tonsillectomy    . Back surgery    . Joint replacement      x 2  . Colonoscopy  2007    History reviewed. No pertinent family history.  Social History History  Substance Use Topics  . Smoking status: Never Smoker   . Smokeless tobacco: Never Used  . Alcohol Use: No    Allergies  Allergen Reactions  . Sulphur [Sulfur]     Current Outpatient Prescriptions  Medication Sig Dispense Refill  . aspirin 81 MG tablet Take 81 mg by mouth daily.    Marland Kitchen diltiazem (DILACOR XR) 180 MG 24 hr capsule Take 180 mg by mouth daily.    . furosemide (LASIX) 20 MG tablet Take 20 mg by mouth daily.    Marland Kitchen levothyroxine (SYNTHROID, LEVOTHROID) 112 MCG tablet Take 112 mcg by mouth daily before breakfast.    . metFORMIN (GLUCOPHAGE) 500 MG tablet Take 500 mg by mouth 2 (two) times daily with a meal.    . nitroGLYCERIN 2.5 MG CR capsule Take  2.5 mg by mouth as needed.    . potassium chloride (KLOR-CON) 20 MEQ packet Take 20 mEq by mouth 2 (two) times daily.    . pregabalin (LYRICA) 100 MG capsule Take 100 mg by mouth 2 (two) times daily.    . sitaGLIPtin (JANUVIA) 100 MG tablet Take 100 mg by mouth daily.    . solifenacin (VESICARE) 5 MG tablet Take 5 mg by mouth daily.    Marland Kitchen warfarin (COUMADIN) 6 MG tablet Take 6 mg by mouth one time only at 6 PM.    . polyethylene glycol powder (GLYCOLAX/MIRALAX) powder Take 255 g by mouth once. 255 g 0   No current facility-administered medications for this visit.    Review of Systems Review of Systems  Constitutional: Negative.   Respiratory: Negative.   Cardiovascular: Negative.   Gastrointestinal: Positive for blood in stool and anal bleeding. Negative for nausea, vomiting, abdominal pain, diarrhea, constipation, abdominal distention and rectal pain.    Blood pressure 130/80, pulse 76, resp. rate 14, height 5' (1.524 m), weight 227 lb (102.967 kg).  Physical Exam Physical Exam  Constitutional: She is oriented  to person, place, and time. She appears well-nourished.  Eyes: Conjunctivae are normal. No scleral icterus.  Neck: Neck supple.  Cardiovascular: Normal rate, regular rhythm and normal heart sounds.   Pulmonary/Chest: Effort normal and breath sounds normal.  Abdominal: Soft. Bowel sounds are normal. There is no hepatomegaly.  Neurological: She is alert and oriented to person, place, and time.  Skin: Skin is dry.    Data Reviewed None  Assessment         Plan    Discussed colonoscopy and upper endoscopy,patient agrees.    Patient is scheduled for and upper and lower endoscopy at [facility] on 07/01/14. She is aware to pre register with the hospital. She will stop her Coumadin 5 days prior. She will hold her Metformin the day of prep and procedure. She will only take her Diltiazem at 6 am with a small sip of water. Miralax has been sent into her pharmacy. Patient is aware of  date and instructions.    SANKAR,SEEPLAPUTHUR G 06/16/2014, 2:32 PM

## 2014-06-30 ENCOUNTER — Ambulatory Visit (INDEPENDENT_AMBULATORY_CARE_PROVIDER_SITE_OTHER): Payer: Medicare Other | Admitting: *Deleted

## 2014-06-30 ENCOUNTER — Telehealth: Payer: Self-pay | Admitting: *Deleted

## 2014-06-30 DIAGNOSIS — K625 Hemorrhage of anus and rectum: Secondary | ICD-10-CM | POA: Diagnosis not present

## 2014-06-30 LAB — POC HEMOCCULT BLD/STL (HOME/3-CARD/SCREEN)
Card #2 Fecal Occult Blod, POC: NEGATIVE
FECAL OCCULT BLD: NEGATIVE
Fecal Occult Blood, POC: NEGATIVE

## 2014-06-30 NOTE — Progress Notes (Signed)
Quick Note:  Inform pt labs are normal. F/u as scheduled ______ 

## 2014-06-30 NOTE — Patient Instructions (Signed)
As scheduled

## 2014-06-30 NOTE — Progress Notes (Signed)
All 3 stool cards hemoccult negative.

## 2014-06-30 NOTE — Telephone Encounter (Signed)
Notified patient as instructed, patient pleased. Discussed follow-up appointments, patient agrees  

## 2014-07-01 ENCOUNTER — Ambulatory Visit: Payer: Self-pay | Admitting: General Surgery

## 2014-07-01 DIAGNOSIS — D125 Benign neoplasm of sigmoid colon: Secondary | ICD-10-CM | POA: Diagnosis not present

## 2014-07-01 DIAGNOSIS — K921 Melena: Secondary | ICD-10-CM | POA: Diagnosis not present

## 2014-07-01 DIAGNOSIS — D123 Benign neoplasm of transverse colon: Secondary | ICD-10-CM | POA: Diagnosis not present

## 2014-07-01 DIAGNOSIS — K2970 Gastritis, unspecified, without bleeding: Secondary | ICD-10-CM | POA: Diagnosis not present

## 2014-07-02 ENCOUNTER — Encounter: Payer: Self-pay | Admitting: General Surgery

## 2014-07-06 ENCOUNTER — Encounter: Payer: Self-pay | Admitting: General Surgery

## 2014-08-06 ENCOUNTER — Telehealth: Payer: Self-pay | Admitting: *Deleted

## 2014-08-06 NOTE — Telephone Encounter (Signed)
Pt is calling, wanting to know results of colonoscopy from 07/01/14.

## 2014-09-08 ENCOUNTER — Encounter: Payer: Self-pay | Admitting: General Surgery

## 2014-09-08 ENCOUNTER — Ambulatory Visit (INDEPENDENT_AMBULATORY_CARE_PROVIDER_SITE_OTHER): Payer: Medicare Other | Admitting: General Surgery

## 2014-09-08 VITALS — BP 130/78 | HR 82 | Resp 13 | Ht 60.0 in | Wt 231.0 lb

## 2014-09-08 DIAGNOSIS — K648 Other hemorrhoids: Secondary | ICD-10-CM | POA: Diagnosis not present

## 2014-09-08 DIAGNOSIS — K625 Hemorrhage of anus and rectum: Secondary | ICD-10-CM

## 2014-09-08 MED ORDER — HYDROCORTISONE ACE-PRAMOXINE 2.5-1 % RE CREA: 1.0000 "application " | TOPICAL_CREAM | Freq: Two times a day (BID) | RECTAL | Status: DC

## 2014-09-08 MED ORDER — HYDROCORTISONE ACE-PRAMOXINE 2.5-1 % RE CREA: 1.0000 "application " | TOPICAL_CREAM | Freq: Three times a day (TID) | RECTAL | Status: DC

## 2014-09-08 NOTE — Progress Notes (Signed)
Patient ID: Debra Butler, female   DOB: 1921/08/13, 81 y.o.   MRN: [id number]  Chief Complaint  Patient presents with  . Other    rectal bleeding    HPI Debra Butler is a 81 y.o. female here today for rectal bleeding. Patient had a colonoscopy and upper endoscopy done on 07/01/14. She states that she has had rectal bleeding for about 6 months. She states today she did not have any bleeding.   HPIUpper endoscopy was normal. Colonoscopy shwed 3 polyps that were removed-adenomatous. None had any sign of bleeding.  Past Medical History  Diagnosis Date  . Hypertension 1988  . Diabetes mellitus without complication 0272  . Myocardial infarction   . IBS (irritable bowel syndrome)   . Blood clot in vein 2013    left leg    Past Surgical History  Procedure Laterality Date  . Fracture surgery  2010    hip  . Fracture surgery  2009    femur  . Foot surgery    . Carpal tunnel release Right 2010  . Breast biopsy      at age 54, benign  . Colon surgery  1984  . Cholecystectomy    . Appendectomy    . Pacemaker placement  2013  . Tonsillectomy    . Back surgery    . Joint replacement      x 2  . Colonoscopy  2007  . Abdominal hysterectomy  age 89    partial, still has ovaries and tubes    History reviewed. No pertinent family history.  Social History History  Substance Use Topics  . Smoking status: Never Smoker   . Smokeless tobacco: Never Used  . Alcohol Use: No    Allergies  Allergen Reactions  . Sulphur [Sulfur]     Current Outpatient Prescriptions  Medication Sig Dispense Refill  . aspirin 81 MG tablet Take 81 mg by mouth daily.    Marland Kitchen diltiazem (DILACOR XR) 180 MG 24 hr capsule Take 180 mg by mouth daily.    . furosemide (LASIX) 20 MG tablet Take 20 mg by mouth daily.    Marland Kitchen levothyroxine (SYNTHROID, LEVOTHROID) 112 MCG tablet Take 112 mcg by mouth daily before breakfast.    . metFORMIN (GLUCOPHAGE) 500 MG tablet Take 500 mg by mouth 2 (two) times daily with a  meal.    . nitroGLYCERIN 2.5 MG CR capsule Take 2.5 mg by mouth as needed.    . polyethylene glycol powder (GLYCOLAX/MIRALAX) powder Take 255 Butler by mouth once. 255 Butler 0  . potassium chloride (KLOR-CON) 20 MEQ packet Take 20 mEq by mouth 2 (two) times daily.    . pregabalin (LYRICA) 100 MG capsule Take 100 mg by mouth 2 (two) times daily.    . sitaGLIPtin (JANUVIA) 100 MG tablet Take 100 mg by mouth daily.    . solifenacin (VESICARE) 5 MG tablet Take 5 mg by mouth daily.    Marland Kitchen warfarin (COUMADIN) 6 MG tablet Take 6 mg by mouth one time only at 6 PM.    . hydrocortisone-pramoxine (ANALPRAM HC) 2.5-1 % rectal cream Place 1 application rectally 3 (three) times daily. 30 Butler 2   No current facility-administered medications for this visit.    Review of Systems Review of Systems  Constitutional: Negative.   Respiratory: Negative.   Cardiovascular: Negative.     Blood pressure 130/78, pulse 82, resp. rate 13, height 5' (1.524 m), weight 231 lb (104.781 kg).  Physical Exam Physical Exam  Constitutional: She is oriented to person, place, and time. She appears well-developed and well-nourished.  Neurological: She is alert and oriented to person, place, and time.  Skin: Skin is warm and dry.  Rectal exam shows at least one prominent internal hemorrhoid.  Data Reviewed Colonoscopy and endoscopy-no bleeding. Few benign polyps  Assessment    Rectal bleeding likely from internal hemorrhoids    Plan   Trial of Analpram cream. Reassess in 6 weeks Hemorrhoid banding if no response to local therapy.     PCP:  Debra Butler  Debra Butler 09/10/2014, 6:23 AM

## 2014-09-10 ENCOUNTER — Encounter: Payer: Self-pay | Admitting: General Surgery

## 2014-09-15 NOTE — H&P (Signed)
PATIENT NAME:  Debra Butler, Debra Butler MR#:  [id number] DATE OF BIRTH:  1921-12-21  DATE OF ADMISSION:  05/12/2012  REFERRING PHYSICIAN: Dr. Lurline Hare  PRIMARY CARE PHYSICIAN:  Dr. Belinda Fisher  CHIEF COMPLAINT:  Lightheadedness and vertigo.   HISTORY OF PRESENT ILLNESS:  This is an 81 year old female who lives at home, who presents with complaints of an episode of lightheadedness and vertigo. The patient reports she had been sitting, watching TV where she had episode of blurry vision, and then she felt the room spinning around her and she felt lightheaded. When she stood up she thought she felt lightheaded, almost like fainting, so she sat down and she reports she continued to have the vertigo. Reportedly at this point it is much improved especially after receiving diazepam injection in the  ED but reports this is still present whenever she turns her head. The patient was not orthostatic in the ED. The patient reports she had a brief episode of chest tightness, which lasted around 30 seconds, and resolved without any intervention. As well, the patient had some complaints of some headache and nausea.  Upon presentation the patient had elevated blood pressure at 185/94.  The patient reports she is usually having good controlled blood pressure. The patient had a CT of the brain done in the ED which did not show any acute intracranial pathology. As well, the patient is known to have history of atrial fibrillation, where her rate was controlled. As well she is on anticoagulation.  Her INR was found to be subtherapeutic at 1.1.   PAST MEDICAL HISTORY: 1.  History of atrial fibrillation with severe junctional bradycardia with syncopal episode status post hospitalization in August 2012 with a pacemaker implant.  2.  History of hip fracture.  3.  Coronary artery disease with a history myocardial infarction last in 2007.  4.  Diabetes mellitus, on oral medication.  5.  Obesity.  6.  History of cholecystectomy.   7.  Hypertension with left ventricular hypertrophy.  8.  Degenerative joint disease with bilateral knee replacement in 2000 and 2002.  9.  Left hip replacement 2003.   10.  Peripheral neuropathy.  11.  History of back surgery for spinal stenosis.  12.  History of colectomy, partial, in 1986 for diverticulosis.  13.  Left femoral embolectomy for deep vein thrombosis. 14.  Cataract surgery.  15.  Total hysterectomy.  16.  Obstructive sleep apnea on CPAP.   SOCIAL HISTORY:  No history of alcohol or tobacco abuse.   FAMILY HISTORY:  Denies any family history of cardiac disease.   REVIEW OF SYSTEMS:   GENERAL:  She denies any fevers, complaints of unilateral fatigue and weakness.   Eyes:  Had an episode of  blurry vision, which is currently resolved. Denies any eye pain, glaucoma or cataracts.  ENT:  Denies any tinnitus, ear pain, hearing loss, epistaxis.  RESPIRATORY:  Denies cough, wheezing, hemoptysis, asthma, or COPD.  CARDIOVASCULAR:  Had episode of chest pain resolved. Denies any edema, arrhythmia, palpitations, or syncope.  GASTROINTESTINAL:  Denies vomiting, diarrhea, abdominal pain, hematemesis, or melena, GERD.  Had episode of nausea.  GENITOURINARY:  Denies dysuria, hematuria, or renal colic.  ENDOCRINE:  Denies polyuria, polydipsia, heat or cold intolerance.  HEMATOLOGY:  Denies easy bruising or bleeding diathesis.  INTEGUMENTARY:  Denies acne, rash, or skin lesions.  MUSCULOSKELETAL:  Denies any gout or cramps. Has history of arthritis.  NEUROLOGIC:  Denies any seizure. Has complaints of generalized weakness and vertigo.  PSYCHIATRIC:  Denies anxiety, schizophrenia, insomnia, nervousness, or bipolar disorder.   PHYSICAL EXAMINATION: VITAL SIGNS:  Temperature 97.8, pulse 90, respiratory rate 20, blood pressure 152/76, saturating 94% on oxygen.  GENERAL:  Elderly female who looks comfortable in no apparent distress.  HEENT:   Head is atraumatic, normocephalic. Pupils equal,  reactive to light. Pink conjunctivae, anicteric sclerae. Moist oral mucosa.  NECK: Supple.  No thyromegaly. No JVD.  CHEST:  Good air entry bilaterally. No wheezing, rales, or rhonchi.  CARDIOVASCULAR:  S1, S2 heard. No rubs, murmur, or gallops.   ABDOMEN:  Soft, nontender, nondistended. Bowel sounds present.  EXTREMITIES:  Mild pitting edema bilaterally, pulses +2 bilaterally. No clubbing. No cyanosis.  PSYCHIATRIC:  Awake, alert, and oriented x3. Intact judgment and insight.  NEUROLOGIC:  Cranial nerves grossly intact. Motor 5 out of 5 in all extremities.  SKIN:  Warm and dry. Normal skin turgor.   PERTINENT DIAGNOSTIC DATA:  Glucose 151, BUN 21, creatinine 0.98, sodium 142, potassium 4, chloride 108, CO2 of 27. Troponin less than 0.02. White blood cells 6.9, hemoglobin 14.1, hematocrit 43.2, platelets 150.   CT of the head does not show any acute abnormality.    EKG showing atrial fibrillation with ventricular rate of 76.   ASSESSMENT AND PLAN:  This is an 81 year old female who presents with lightheadedness, vertigo, and near fainting, as well had repeat episode of chest tightness. The patient had negative troponins, no EKG changes and no acute finding on CT brain. The patient was not orthostatic in the ED.  1.  Near syncope versus benign positional vertigo. From the patient's description it appears this episode might be due to benign positional vertigo more than actual near syncope, as it has to do a lot with head turning and positioning, so we will start on meclizine.  We will request PT consult, but as well at the same time, we will admit her to telemetry to monitor her heart rhythm and will consult cardiology to interrogate her pacemaker and will continue to cycle troponins.  2.  Chest pain. The patient has only a brief episodes, currently resolved, unlikely cardiac. The patient was given 324 mg of aspirin in the ED, and we will continue to cycle troponins and follow the trend.  3.   Diabetes mellitus. Will continue on Januvia and will hold on oral hypoglycemic agents (metformin) and will have on insulin sliding scale.  4.  Hypertension, uncontrolled. We will continue on her medication and if needed, will have her on p.r.n. hydralazine.  5.  Neuropathy. Will continue with Lyrica. 6.  Atrial fibrillation, rate controlled on warfarin. Will consult pharmacy to dose warfarin even though her INR is subtherapeutic.  7.  Hypothyroidism. Will continue with Synthroid.  8.  Deep vein thrombosis prophylaxis. The patient is on warfarin, so no need for any other kind of anticoagulation.  CODE STATUS:  The patient is FULL CODE.   TOTAL TIME SPENT ON ADMISSION AND PATIENT CARE:  45 minutes.    ____________________________ Albertine Patricia, MD dse:ct D: 05/12/2012 07:03:32 ET T: 05/12/2012 13:02:15 ET JOB#: [id number]  cc: Albertine Patricia, MD, <Dictator>  Saw [place] Graciela Husbands MD ELECTRONICALLY SIGNED 05/13/2012 4:25

## 2014-09-15 NOTE — Discharge Summary (Signed)
PATIENT NAME:  Debra Butler, Debra Butler MR#:  [id number] DATE OF BIRTH:  02/05/1922  DATE OF ADMISSION:  05/12/2012 DATE OF DISCHARGE:  05/14/2012  FAMILY DOCTOR: Dr. Ronnald Collum  CHIEF COMPLAINT: Light-headedness and vertigo.    DISCHARGE DIAGNOSES: 1. Positional vertigo, dizziness secondary to positional vertigo. 2. History of atrial fibrillation with pacemaker placement.  3. History of coronary artery disease.  4. Diverticulitis.  5. History of obesity.  6. Peripheral neuropathy.  7. History of back pains.   HOSPITAL COURSE: The patient is an 81 year old female with multiple medical problems, came in with lightheadedness and _feeling of fainting. The patient was admitted for near syncope and positional vertigo.  The patient started on meclizine. CT of the head did not show any  changes. She was admitted for overnight observation. She could not have MRI because she has a pacemaker. The patient did not have any rhythm problems  on tele .She was monitored on telemetry. She had complained of some left ear discomfort. I checked on the left ear. She has some tympanic membrane congestion. I started her on Flonase nasal spray. I advised her to continue it; and she follows up with Dr. Beverly Gust, and I advised her to follow with him. She was seen by a physical therapist and recommended home health; but she refused because of  Alzheimer's dementia.to Her husband and she did not want any new persons in the  house  that makes the dementia worse, so she refused home health.  She had a carotid ultrasound because she had dizziness, which did not show any evidence hemodynamically significant stenosis.    LABORATORY, DIAGNOSTIC AND RADIOLOGICAL DATA:  Hemoglobin 13.7, hematocrit 41.7, platelets 101.0. The patient's glucose was 108. Electrolytes showed sodium of 140, potassium 3.9, chloride 107, bicarbonate 27, BUN 19, creatinine 1, glucose 119, magnesium 1.8. The patient's troponin was less than 0.02.  Chest x-ray  showed cardiomegaly and no definite edema or effusion, and pacemaker is present.  DISCHARGE MEDICATIONS: Lyrica 100 mg p.o. b.i.d., aspirin 81 mg daily, Januvia 100 mg p.o. daily, metformin 500 mg p.o. daily, clorazepate 3.75 mg p.o. t.i.d., Lasix 20 mg p.o. q. 48 hours, warfarin 5 mg p.o. daily, Synthroid 100 mcg p.o. daily, meclizine 25 mg p.o. t.i.d. as needed,  Nasonex 50 mcg, 2 sprays 2 times a day.   DIET: Low sodium, ADA diet.   FOLLOWUP: With Dr. Ronnald Collum in 1 to 2 weeks. She also has a follow-up appointment with Dr. Lavera Guise for her pacemaker in 1 to 2 weeks.  TIME SPENT ON DISCHARGE PREPARATION: More than 30 minutes.   ____________________________ Epifanio Lesches, MD sk:cb D: 05/16/2012 22:33:20 ET T: 05/17/2012 11:33:43 ET JOB#: [id number]  cc: Epifanio Lesches, MD, <Dictator> Epifanio Lesches MD ELECTRONICALLY SIGNED 05/19/2012 13:17

## 2014-09-19 NOTE — Discharge Summary (Signed)
PATIENT NAME:  Debra Butler, Debra Butler MR#:  [id number] DATE OF BIRTH:  Dec 17, 1921  DATE OF ADMISSION:  03/30/2014 DATE OF DISCHARGE:  03/31/2014  PRESENTING COMPLAINT: Left hip pain.   DISCHARGE DIAGNOSES:  1.  Left hip pain, suspected due to left trochanteric bursitis.  2.  Atrial fibrillation on Coumadin.  3.  Degenerative joint disease.   CODE STATUS: Full Code.   MEDICATIONS:  1.  Lyrica 100 mg p.o. b.i.d.  2.  Januvia 100 mg p.o. daily.  3.  Metformin 500 mg p.o. daily.  4.  VESIcare 5 mg p.o. daily.  5.  Diltiazem extended release 180 mg p.o. daily.  6.  Potassium chloride 20 mEq 2 tablets once a day.  7.  Aspirin 81 mg daily.  8.  Synthroid 112 mcg p.o. daily.  9.  Nitroglycerin sublingual 0.4 mg as needed.  10.  Tylenol PM 500 1 tablet daily at bedtime.  11.  Lasix 20 mg every other day.  12.  Acetaminophen/oxycodone 5/325 mf 1 every 4 hours as needed. The patient asked to hold Coumadin and to call Dr. De Hollingshead office on Wednesday, 03/31/2014 to determine the dosage he will need to take.   ACTIVITY: Home health physical therapy.   DIET: Low sodium, carbohydrate controlled diet.   FOLLOWUP:  1.  Follow up with Dr. Ronnald Collum.  2.  Follow up with Dr. Margaretmary Eddy in orthopedics. INR on discharge was 3.4.   CONSULTATIONS: Orthopedic consultation with Laurene Footman, MD.   LABORATORY DATA: White count was 8.1, H and H 13.9 and 42.8. Creatinine was 1.06.  PT/INR was 33.5 and 3.4 on 03/31/2014  BRIEF SUMMARY OF HOSPITAL COURSE: Ms. Stanko is a 81 year old Caucasian female with history of left hip fracture surgeries and CAD along with a history of AFib on Coumadin, who came in after having left hip pain. We found no acute fracture of CT left hip. The patient was admitted on the ortho floor, with:  1.  Left hip pain, likely secondary to possible left trochanteric bursitis. The patient was seen by Dr. Rudene Christians who was recommending a local injection. However, with her INR 3.4, Dr. Rudene Christians  was unable to do so. The patient was doing well with physical therapy; hence, the decision was made for her to be followed by Dr. Margaretmary Eddy as an outpatient. She ambulated well with physical therapy. Home PT and been arranged.  2.  Type 2 diabetes. Continue home medications, Januvia and metformin.  3.  History of atrial fibrillation on Coumadin. Continue Cardizem. INR is 3.4. The patient was asked to hold off her Coumadin, and call Dr. De Hollingshead office tomorrow to determine the dosing.  4.  Hypothyroidism. Continue levothyroxine.  5.  Coronary artery disease and hypertension. Home meds were continued.   Home health PT was arranged.   Hospital stay otherwise remained stable.   CODE STATUS: She remained a Full Code.   TIME SPENT: 40 minutes   ____________________________ Gus Height A. Posey Pronto, MD sap:MT D: 04/04/2014 16:29:47 ET T: 04/04/2014 16:50:29 ET JOB#: [id number]  cc: Carlis Burnsworth A. Posey Pronto, MD, <Dictator> Lenard Simmer, MD Lucas Mallow, MD Laurene Footman, MD  Ilda Basset MD ELECTRONICALLY SIGNED 04/16/2014 11:50

## 2014-09-19 NOTE — Consult Note (Signed)
Brief Consult Note: Diagnosis: left trochanteric bursitis secondary to old hardware.   Patient was seen by consultant.   Orders entered.   Comments: will try injection tomorrow.  Electronic Signatures: Laurene Footman (MD)  (Signed [phone] 17:20)  Authored: Brief Consult Note   Last Updated: 02-Nov-15 17:20 by Laurene Footman (MD)

## 2014-09-19 NOTE — H&P (Signed)
PATIENT NAME:  Debra Butler, Debra Butler MR#:  [id number] DATE OF BIRTH:  11/18/1921  DATE OF ADMISSION:  03/30/2014  PRIMARY CARE PHYSICIAN: Belinda Fisher, MD  REFERRING EMERGENCY ROOM PHYSICIAN: Francene Castle, MD  PRIMARY CARDIOLOGIST: Serafina Royals, MD  PRIMARY ORTHOPEDIC PHYSICIAN: Margaretmary Eddy, MD   CHIEF COMPLAINT: Left hip pain.   HISTORY OF PRESENT ILLNESS: A [age] year old female with history of left hip fracture and surgery 3 years ago, has atrial fibrillation status post pacemaker, diabetes, coronary artery disease, hypertension, and hypothyroidism. She lives in home with her husband who has dementia and Alzheimer's. She walks with a walker. Last week she twisted her leg while walking and since then she has some pain in her left hip. Initially she tried hot water bag and some pain medications and it helped for almost a week, but then yesterday she started having pain again, which was very severe and she could not bear it. She says that the pain was very severely, was feeling a little more comfortable while walking, but sitting or lying down was making the pain worse and so she could not sleep at night, she only had like 2 hours sleep total in night and continued getting up and walk because of that, so finally in the morning she decided to come to the Emergency Room. X-ray of left hip and CAT scan are done which does not show any acute fracture, but because of severe pain and functional debility she is given to hospitalist team for further management.   REVIEW OF SYSTEMS: CONSTITUTIONAL: Negative for fever, fatigue, weakness. Has pain in the left hip.  EYES: No blurring, double vision, discharge or redness.  EARS, NOSE, THROAT: No tinnitus, ear pain, or hearing loss.  RESPIRATORY: No cough, wheezing, hemoptysis, or shortness of breath.  CARDIOVASCULAR: No chest pain, orthopnea, edema, arrhythmia or palpitations.  GASTROINTESTINAL: No nausea, vomiting, diarrhea, abdominal pain.   GENITOURINARY: No dysuria, hematuria, increased frequency. ENDOCRINE: No heat or cold intolerance.  MUSCULOSKELETAL: The patient has pain in left hip, but movements are throughout.    NEUROLOGICAL: No numbness, weakness, tremor or vertigo.  PSYCHIATRIC: No anxiety, insomnia, bipolar disorder.   PAST MEDICAL HISTORY: 1.  Atrial fibrillation with severe junctional bradycardia with syncopal episode and status post pacemaker placement.  2.  Left hip fracture and replacement in 2003.  3.  Coronary artery disease with myocardial infarction in 2007.  4.  Diabetes.  5.  Obesity.  6.  Cholecystectomy.  7.  Left ventricular hypertrophy.  8.  Degenerative joint disease.  9.  Peripheral neuropathy.  10.  History of back surgery and spinal stenosis.  11.  Colectomy, partial, in 1986 for diverticulosis.  12.  Left femoral embolectomy for deep vein thrombosis.  13.  Cataract surgery.  14.  Total hysterectomy.  15.  Obstructive sleep apnea on CPAP.   SOCIAL HISTORY: She lives with her husband who is 83 years old and he walks with a walker. She denies any alcohol or smoking history, and she uses walker while walking.   FAMILY HISTORY: Denies any family history of cardiac disease.   HOME MEDICATIONS: 1.  VESIcare 5 mg oral tablet once a day.  2.  Tylenol PM 500/25 mg once a day.  3.  Synthroid 112 mcg oral once a day.  4.  Potassium chloride 20 mEq two tablets once a day.  5.  Nitroglycerin 0.4 mg sublingual every 5 minutes as needed for chest pain, total 3 tablets.  6.  Metformin 500 mg oral  tablet once a day.  7.  Lyrica 100 mg capsule 2 times a day.  8.  Januvia 100 mg oral once a day.  9.  Furosemide 20 mg once a day.  10.  Diltiazem 180 mg extended release once a day.  11.  Coumadin 6 mg oral once a day.  12.  Aspirin 81 mg once a day.   PHYSICAL EXAMINATION: VITAL SIGNS: In the ER, temperature 98.4, pulse 92, respirations 20, blood pressure 159/102, pulse ox 97 on room air.  GENERAL:  The patient is fully alert and oriented to time, place, and person. Does not appear in acute distress. Obese.  HEENT: Head and neck atraumatic. Conjunctivae pink. Oral mucosa moist.  NECK: Supple. No JVD.  RESPIRATORY: Bilateral equal and clear air entry.  CARDIOVASCULAR: S1, S2 present. Regular. No murmur.  ABDOMEN: Soft, nontender. Bowel sounds present. No organomegaly. SKIN: No rashes.  LEGS: Some swelling around ankles.  JOINTS: Left hip pain.  NEUROLOGIC: No tingling or numbness. Follows commands. Power 4/5 in all 4 limbs, but she feels pain while lifting her left leg up at her hip. PSYCHIATRIC: Does not appear in any acute psychiatric illness at this time.   DIAGNOSTIC DATA: Important lab results: Glucose 132, BUN 15, creatinine 1, sodium 141, potassium 5.2, chloride 104, CO2 28, calcium 8.5.   Troponin less than 0.02.   WBC 8.4, hemoglobin 14.6, platelet count 112,000 and MCV 98.   CT scan of the hip: Left femoral intramedullary nail fixing, healed left intertrochanteric fracture. Subchondral linear sclerotic abnormality in the left femoral head most consistent with osteoarthritis and/or mild avascular necrosis.   ASSESSMENT AND PLAN: A 81 year old female who has history of left hip fracture and surgeries and coronary artery disease and atrial fibrillation who came to the Emergency Room after having left hip pain and found no acute fracture on CT of the left hip.  1.  Left hip pain. As mentioned above, it might be due to arthritis or avascular necrosis. I will call orthopedic surgeon to comment on further management on this issue. Meanwhile, we will just continue on pain management. We will also get physical therapy evaluation to decide about her discharge planning. Pain management with morphine and Percocet.  2.  Diabetes. We will continue home medication, Januvia and metformin, and we will put on insulin sliding scale coverage.  3.  Atrial fibrillation. She is on Coumadin. We will  check INR level and we will continue Cardizem extended release tablet.  4.  Hypothyroidism. Continue levothyroxine  5.  Coronary artery disease and hypertension. We will continue furosemide as she is taking at home.   TOTAL TIME SPENT ON THIS ADMISSION: 50 minutes. ____________________________ Ceasar Lund Anselm Jungling, MD vgv:sb D: 03/30/2014 14:48:00 ET T: 03/30/2014 15:28:47 ET JOB#: [id number]  cc: Lenard Simmer, MD Ceasar Lund Anselm Jungling, MD, <Dictator>    [name] MD ELECTRONICALLY SIGNED 04/08/2014 22:18

## 2014-09-21 LAB — SURGICAL PATHOLOGY

## 2014-10-20 ENCOUNTER — Ambulatory Visit (INDEPENDENT_AMBULATORY_CARE_PROVIDER_SITE_OTHER): Payer: Medicare Other | Admitting: General Surgery

## 2014-10-20 ENCOUNTER — Encounter: Payer: Self-pay | Admitting: General Surgery

## 2014-10-20 VITALS — BP 116/68 | HR 68 | Resp 16 | Wt 223.0 lb

## 2014-10-20 DIAGNOSIS — K648 Other hemorrhoids: Secondary | ICD-10-CM | POA: Diagnosis not present

## 2014-10-20 NOTE — Progress Notes (Signed)
Patient ID: Debra Butler, female   DOB: 01-07-1922, 81 y.o.   MRN: [id number]  Chief Complaint  Patient presents with  . Follow-up    HPI Debra Butler is a 81 y.o. female.  female here today for follow up rectal bleeding. Patient had a colonoscopy and upper endoscopy done on 07/01/14. She states that she has had rectal bleeding but not as bad, she feels it is related to what she eats. She feels it maybe chocolate. She has several BM a day. She does feel like the cream helped the hemorrhoids and she does not have any"burning".   HPI  Past Medical History  Diagnosis Date  . Hypertension 1988  . Diabetes mellitus without complication 9562  . Myocardial infarction   . IBS (irritable bowel syndrome)   . Blood clot in vein 2013    left leg    Past Surgical History  Procedure Laterality Date  . Fracture surgery  2010    hip  . Fracture surgery  2009    femur  . Foot surgery    . Carpal tunnel release Right 2010  . Breast biopsy      at age 35, benign  . Colon surgery  1984  . Cholecystectomy    . Appendectomy    . Pacemaker placement  2013  . Tonsillectomy    . Back surgery    . Joint replacement      x 2  . Colonoscopy  2007, 2016    Dr Jamal Collin  . Abdominal hysterectomy  age 4    partial, still has ovaries and tubes  . Upper gi endoscopy  2016    Dr Jamal Collin    History reviewed. No pertinent family history.  Social History History  Substance Use Topics  . Smoking status: Never Smoker   . Smokeless tobacco: Never Used  . Alcohol Use: No    Allergies  Allergen Reactions  . Sulphur [Sulfur]     Current Outpatient Prescriptions  Medication Sig Dispense Refill  . aspirin 81 MG tablet Take 81 mg by mouth daily.    Marland Kitchen diltiazem (DILACOR XR) 180 MG 24 hr capsule Take 180 mg by mouth daily.    . furosemide (LASIX) 20 MG tablet Take 20 mg by mouth daily.    Marland Kitchen levothyroxine (SYNTHROID, LEVOTHROID) 112 MCG tablet Take 112 mcg by mouth daily before breakfast.    .  metFORMIN (GLUCOPHAGE) 500 MG tablet Take 1,000 mg by mouth 2 (two) times daily with a meal.     . nitroGLYCERIN 2.5 MG CR capsule Take 2.5 mg by mouth as needed.    . potassium chloride (KLOR-CON) 20 MEQ packet Take 20 mEq by mouth 2 (two) times daily.    . pregabalin (LYRICA) 100 MG capsule Take 100 mg by mouth 2 (two) times daily.    . sitaGLIPtin (JANUVIA) 100 MG tablet Take 100 mg by mouth daily.    . solifenacin (VESICARE) 5 MG tablet Take 5 mg by mouth daily.    Marland Kitchen warfarin (COUMADIN) 6 MG tablet Take 6 mg by mouth one time only at 6 PM.    . hydrocortisone-pramoxine (ANALPRAM HC) 2.5-1 % rectal cream Place 1 application rectally 3 (three) times daily. (Patient not taking: Reported on 10/20/2014) 30 g 2   No current facility-administered medications for this visit.    Review of Systems Review of Systems  Constitutional: Negative.   Respiratory: Negative.   Cardiovascular: Negative.   Gastrointestinal: Positive for diarrhea and blood  in stool. Negative for abdominal pain and rectal pain.    Blood pressure 116/68, pulse 68, resp. rate 16, weight 223 lb (101.152 kg).  Physical Exam Physical Exam  Constitutional: She is oriented to person, place, and time. She appears well-developed and well-nourished.  Neurological: She is alert and oriented to person, place, and time.  Skin: Skin is warm and dry.  Rectal exam again showed a partially prolapsing internal hemorrhoid on right side.  Data Reviewed Prior note.   Assessment    Bleeding, prolapsing internal hemorrhoid. Pt wished to have it banded today.      Plan    Procedure: The rectal area was prepped with betadine. The internal hemorrhoid base on right was infiltrard with 1 mlo 1% xylocaine. The hemorrhoid was successfully banded.   Pt advised to call for any new or recurrent problems with hemorrhoids.     PCP:  Daphene Jaeger G 10/20/2014, 3:58 PM

## 2014-10-20 NOTE — Patient Instructions (Signed)
The patient is aware to call back for any questions or concerns.  

## 2014-12-18 ENCOUNTER — Other Ambulatory Visit: Payer: Self-pay | Admitting: Endocrinology

## 2014-12-18 DIAGNOSIS — I482 Chronic atrial fibrillation, unspecified: Secondary | ICD-10-CM

## 2014-12-23 ENCOUNTER — Ambulatory Visit: Payer: Self-pay

## 2014-12-31 ENCOUNTER — Ambulatory Visit: Payer: Self-pay

## 2015-01-04 ENCOUNTER — Ambulatory Visit
Admission: RE | Admit: 2015-01-04 | Discharge: 2015-01-04 | Disposition: A | Discharge status: Home / Self Care | Payer: Medicare Other | Source: Ambulatory Visit | Attending: Endocrinology | Admitting: Endocrinology

## 2015-01-04 DIAGNOSIS — I071 Rheumatic tricuspid insufficiency: Secondary | ICD-10-CM | POA: Diagnosis not present

## 2015-01-04 DIAGNOSIS — I371 Nonrheumatic pulmonary valve insufficiency: Secondary | ICD-10-CM | POA: Insufficient documentation

## 2015-01-04 DIAGNOSIS — I340 Nonrheumatic mitral (valve) insufficiency: Secondary | ICD-10-CM | POA: Diagnosis not present

## 2015-01-04 DIAGNOSIS — I482 Chronic atrial fibrillation, unspecified: Secondary | ICD-10-CM

## 2015-01-04 DIAGNOSIS — I351 Nonrheumatic aortic (valve) insufficiency: Secondary | ICD-10-CM | POA: Diagnosis not present

## 2016-03-28 ENCOUNTER — Encounter (INDEPENDENT_AMBULATORY_CARE_PROVIDER_SITE_OTHER): Payer: Self-pay | Admitting: Vascular Surgery

## 2016-03-28 ENCOUNTER — Ambulatory Visit (INDEPENDENT_AMBULATORY_CARE_PROVIDER_SITE_OTHER): Payer: Medicare Other | Admitting: Vascular Surgery

## 2016-03-28 VITALS — BP 138/82 | HR 88 | Resp 16 | Ht 60.0 in | Wt 194.0 lb

## 2016-03-28 DIAGNOSIS — I890 Lymphedema, not elsewhere classified: Secondary | ICD-10-CM

## 2016-03-28 DIAGNOSIS — M7989 Other specified soft tissue disorders: Secondary | ICD-10-CM

## 2016-03-28 DIAGNOSIS — E118 Type 2 diabetes mellitus with unspecified complications: Secondary | ICD-10-CM

## 2016-04-03 DIAGNOSIS — E119 Type 2 diabetes mellitus without complications: Secondary | ICD-10-CM | POA: Insufficient documentation

## 2016-04-03 DIAGNOSIS — M7989 Other specified soft tissue disorders: Secondary | ICD-10-CM | POA: Insufficient documentation

## 2016-04-03 DIAGNOSIS — I890 Lymphedema, not elsewhere classified: Secondary | ICD-10-CM | POA: Insufficient documentation

## 2016-04-03 NOTE — Progress Notes (Signed)
MRN : [id number]  Debra Butler is a 82 y.o. (1922-05-05) female who presents with chief complaint of  Chief Complaint  Patient presents with  . Follow-up  .  History of Present Illness: Patient returns today in follow up of Her leg swelling. Her swelling is a little worse today than it was at her last visit, but she has not had as much help around the house and just recently had to put her husband in a nursing home which has resulted in her being up on her feet a lot more. Her skin is intact and she does not have ulceration. The pain is mild.  Current Outpatient Prescriptions  Medication Sig Dispense Refill  . acetaminophen (TYLENOL) 325 MG tablet Take 650 mg by mouth every 6 (six) hours as needed.    Marland Kitchen aspirin 81 MG tablet Take 81 mg by mouth daily.    Marland Kitchen diltiazem (DILACOR XR) 180 MG 24 hr capsule Take 180 mg by mouth daily.    . furosemide (LASIX) 20 MG tablet Take 20 mg by mouth daily.    Marland Kitchen levothyroxine (SYNTHROID, LEVOTHROID) 112 MCG tablet Take 112 mcg by mouth daily before breakfast.    . metFORMIN (GLUCOPHAGE) 500 MG tablet Take 1,000 mg by mouth 2 (two) times daily with a meal.     . nitroGLYCERIN 2.5 MG CR capsule Take 2.5 mg by mouth as needed.    . ondansetron (ZOFRAN) 8 MG tablet Take by mouth every 8 (eight) hours as needed for nausea or vomiting.    . potassium chloride (KLOR-CON) 20 MEQ packet Take 20 mEq by mouth 2 (two) times daily.    . pregabalin (LYRICA) 100 MG capsule Take 100 mg by mouth 2 (two) times daily.    . sitaGLIPtin (JANUVIA) 100 MG tablet Take 100 mg by mouth daily.    Marland Kitchen warfarin (COUMADIN) 6 MG tablet Take 6 mg by mouth one time only at 6 PM.    . hydrocortisone-pramoxine (ANALPRAM HC) 2.5-1 % rectal cream Place 1 application rectally 3 (three) times daily. (Patient not taking: Reported on 03/28/2016) 30 g 2  . solifenacin (VESICARE) 5 MG tablet Take 5 mg by mouth daily.     No current facility-administered medications for this visit.     Past  Medical History:  Diagnosis Date  . Blood clot in vein 2013   left leg  . Diabetes mellitus without complication ([place]) 0000000  . Hypertension 1988  . IBS (irritable bowel syndrome)   . Myocardial infarction     Past Surgical History:  Procedure Laterality Date  . ABDOMINAL HYSTERECTOMY  age 49   partial, still has ovaries and tubes  . APPENDECTOMY    . BACK SURGERY    . BREAST BIOPSY     at age 46, benign  . CARPAL TUNNEL RELEASE Right 2010  . CHOLECYSTECTOMY    . COLON SURGERY  1984  . COLONOSCOPY  2007, 2016   Dr Jamal Collin  . FOOT SURGERY    . FRACTURE SURGERY  2010   hip  . FRACTURE SURGERY  2009   femur  . JOINT REPLACEMENT     x 2  . PACEMAKER PLACEMENT  2013  . TONSILLECTOMY    . UPPER GI ENDOSCOPY  2016   Dr Jamal Collin    Social History Social History  Substance Use Topics  . Smoking status: Never Smoker  . Smokeless tobacco: Never Used  . Alcohol use No     Family History  No reported bleeding or clotting disorders  Allergies  Allergen Reactions  . Sulphur [Sulfur]      REVIEW OF SYSTEMS (Negative unless checked)  Constitutional: [] Weight loss  [] Fever  [] Chills Cardiac: [] Chest pain   [] Chest pressure   [x] Palpitations   [] Shortness of breath when laying flat   [] Shortness of breath at rest   [] Shortness of breath with exertion. Vascular:  [] Pain in legs with walking   [] Pain in legs at rest   [] Pain in legs when laying flat   [] Claudication   [] Pain in feet when walking  [] Pain in feet at rest  [] Pain in feet when laying flat   [] History of DVT   [] Phlebitis   [x] Swelling in legs   [] Varicose veins   [] Non-healing ulcers Pulmonary:   [] Uses home oxygen   [] Productive cough   [] Hemoptysis   [] Wheeze  [] COPD   [] Asthma Neurologic:  [] Dizziness  [] Blackouts   [] Seizures   [] History of stroke   [] History of TIA  [] Aphasia   [] Temporary blindness   [] Dysphagia   [] Weakness or numbness in arms   [] Weakness or numbness in legs Musculoskeletal:  [x] Arthritis    [x] Joint swelling   [] Joint pain   [] Low back pain Hematologic:  [] Easy bruising  [] Easy bleeding   [] Hypercoagulable state   [] Anemic   Gastrointestinal:  [] Blood in stool   [] Vomiting blood  [] Gastroesophageal reflux/heartburn   [] Abdominal pain Genitourinary:  [] Chronic kidney disease   [] Difficult urination  [] Frequent urination  [] Burning with urination   [] Hematuria Skin:  [] Rashes   [] Ulcers   [] Wounds Psychological:  [] History of anxiety   []  History of major depression.  Physical Examination  BP 138/82   Pulse 88   Resp 16   Ht 5' (1.524 m)   Wt 88 kg (194 lb)   BMI 37.89 kg/m  Gen:  WD/WN, NAD. Appears younger than stated age Head: [place]/AT, No temporalis wasting. Ear/Nose/Throat: Hearing grossly intact, nares w/o erythema or drainage, trachea midline Eyes: Conjunctiva clear. Sclera non-icteric Neck: Supple.  No JVD.  Pulmonary:  Good air movement, no use of accessory muscles.  Cardiac: Irregularly irregular Vascular:  Vessel Right Left  Radial Palpable Palpable                                   Gastrointestinal: soft, non-tender/non-distended. No guarding/reflex.  Musculoskeletal: M/S 5/5 throughout.  No deformity or atrophy. 2-3+ right lower extremity edema and 1-2+ left lower extremity edema. Neurologic: Sensation grossly intact in extremities.  Symmetrical.  Speech is fluent.  Psychiatric: Judgment intact, Mood & affect appropriate for pt's clinical situation. Dermatologic: No rashes or ulcers noted.  No cellulitis or open wounds. Lymph : No Cervical, Axillary, or Inguinal lymphadenopathy.      Labs No results found for this or any previous visit (from the past 2160 hour(s)).  Radiology No results found.   Assessment/Plan  Diabetes (HCC) blood glucose control important in reducing the progression of atherosclerotic disease. Also, involved in wound healing. On appropriate medications.   Lymphedema Swelling is a little worse today, but the skin is  intact. We will not go to [employer] today but will continue compression stockings and elevation.  Swelling of limb Swelling is a little worse today, but the skin is intact. We will not go to [employer] today but will continue compression stockings and elevation.    Leotis Pain, MD  04/03/2016 11:12 AM  This note was created with Dragon medical transcription system.  Any errors from dictation are purely unintentional

## 2016-04-03 NOTE — Assessment & Plan Note (Signed)
Swelling is a little worse today, but the skin is intact. We will not go to [employer] today but will continue compression stockings and elevation.

## 2016-04-03 NOTE — Assessment & Plan Note (Signed)
blood glucose control important in reducing the progression of atherosclerotic disease. Also, involved in wound healing. On appropriate medications.  

## 2016-05-30 ENCOUNTER — Ambulatory Visit (INDEPENDENT_AMBULATORY_CARE_PROVIDER_SITE_OTHER): Payer: Medicare Other | Admitting: Vascular Surgery

## 2016-05-30 ENCOUNTER — Encounter (INDEPENDENT_AMBULATORY_CARE_PROVIDER_SITE_OTHER): Payer: Self-pay | Admitting: Vascular Surgery

## 2016-05-30 VITALS — BP 126/79 | HR 99 | Resp 16

## 2016-05-30 DIAGNOSIS — I890 Lymphedema, not elsewhere classified: Secondary | ICD-10-CM | POA: Diagnosis not present

## 2016-05-30 DIAGNOSIS — M7989 Other specified soft tissue disorders: Secondary | ICD-10-CM | POA: Diagnosis not present

## 2016-05-30 DIAGNOSIS — E118 Type 2 diabetes mellitus with unspecified complications: Secondary | ICD-10-CM | POA: Diagnosis not present

## 2016-05-30 NOTE — Progress Notes (Signed)
MRN : [id number]  Debra Butler is a 83 y.o. (03/11/1922) female who presents with chief complaint of  Chief Complaint  Patient presents with  . Follow-up  .  History of Present Illness: Patient returns today in follow up of her leg swelling.  It is doing much better than at her last couple of visits.  She has lost weight, has been wearing her compression stockings, and elevating her legs.  She is doing well today and pleasant as usual.   Current Outpatient Prescriptions  Medication Sig Dispense Refill  . acetaminophen (TYLENOL) 325 MG tablet Take 650 mg by mouth every 6 (six) hours as needed.    Marland Kitchen aspirin 81 MG tablet Take 81 mg by mouth daily.    Marland Kitchen diltiazem (DILACOR XR) 180 MG 24 hr capsule Take 180 mg by mouth daily.    . furosemide (LASIX) 20 MG tablet Take 20 mg by mouth daily.    Marland Kitchen levothyroxine (SYNTHROID, LEVOTHROID) 112 MCG tablet Take 112 mcg by mouth daily before breakfast.    . metFORMIN (GLUCOPHAGE) 500 MG tablet Take 1,000 mg by mouth 2 (two) times daily with a meal.     . nitroGLYCERIN 2.5 MG CR capsule Take 2.5 mg by mouth as needed.    . ondansetron (ZOFRAN) 8 MG tablet Take by mouth every 8 (eight) hours as needed for nausea or vomiting.    . potassium chloride (KLOR-CON) 20 MEQ packet Take 20 mEq by mouth 2 (two) times daily.    . pregabalin (LYRICA) 100 MG capsule Take 100 mg by mouth 2 (two) times daily.    . sitaGLIPtin (JANUVIA) 100 MG tablet Take 100 mg by mouth daily.    Marland Kitchen warfarin (COUMADIN) 6 MG tablet Take 6 mg by mouth one time only at 6 PM.    . hydrocortisone-pramoxine (ANALPRAM HC) 2.5-1 % rectal cream Place 1 application rectally 3 (three) times daily. (Patient not taking: Reported on 03/28/2016) 30 g 2  . solifenacin (VESICARE) 5 MG tablet Take 5 mg by mouth daily.     No current facility-administered medications for this visit.         Past Medical History:  Diagnosis Date  . Blood clot in vein 2013   left leg    . Diabetes mellitus without complication ([place]) 0000000  . Hypertension 1988  . IBS (irritable bowel syndrome)   . Myocardial infarction          Past Surgical History:  Procedure Laterality Date  . ABDOMINAL HYSTERECTOMY  age 40   partial, still has ovaries and tubes  . APPENDECTOMY    . BACK SURGERY    . BREAST BIOPSY     at age 39, benign  . CARPAL TUNNEL RELEASE Right 2010  . CHOLECYSTECTOMY    . COLON SURGERY  1984  . COLONOSCOPY  2007, 2016   Dr Jamal Collin  . FOOT SURGERY    . FRACTURE SURGERY  2010   hip  . FRACTURE SURGERY  2009   femur  . JOINT REPLACEMENT     x 2  . PACEMAKER PLACEMENT  2013  . TONSILLECTOMY    . UPPER GI ENDOSCOPY  2016   Dr Jamal Collin    Social History     Social History  Substance Use Topics  . Smoking status: Never Smoker  . Smokeless tobacco: Never Used  . Alcohol use No     Family History No reported bleeding or clotting disorders  Allergies  Allergen Reactions  . Sulphur [Sulfur]      REVIEW OF SYSTEMS (Negative unless checked)  Constitutional: [] Weight loss  [] Fever  [] Chills Cardiac: [] Chest pain   [] Chest pressure   [x] Palpitations   [] Shortness of breath when laying flat   [] Shortness of breath at rest   [] Shortness of breath with exertion. Vascular:  [] Pain in legs with walking   [] Pain in legs at rest   [] Pain in legs when laying flat   [] Claudication   [] Pain in feet when walking  [] Pain in feet at rest  [] Pain in feet when laying flat   [] History of DVT   [] Phlebitis   [x] Swelling in legs   [] Varicose veins   [] Non-healing ulcers Pulmonary:   [] Uses home oxygen   [] Productive cough   [] Hemoptysis   [] Wheeze  [] COPD   [] Asthma Neurologic:  [] Dizziness  [] Blackouts   [] Seizures   [] History of stroke   [] History of TIA  [] Aphasia   [] Temporary blindness   [] Dysphagia   [] Weakness or numbness in arms   [] Weakness or numbness in legs Musculoskeletal:  [x] Arthritis   [x] Joint swelling    [] Joint pain   [] Low back pain Hematologic:  [] Easy bruising  [] Easy bleeding   [] Hypercoagulable state   [] Anemic   Gastrointestinal:  [] Blood in stool   [] Vomiting blood  [] Gastroesophageal reflux/heartburn   [] Abdominal pain Genitourinary:  [] Chronic kidney disease   [] Difficult urination  [] Frequent urination  [] Burning with urination   [] Hematuria Skin:  [] Rashes   [] Ulcers   [] Wounds Psychological:  [] History of anxiety   []  History of major depression.  Physical Examination  BP 138/82   Pulse 88   Resp 16   Ht 5' (1.524 m)   Wt 88 kg (194 lb)   BMI 37.89 kg/m  Gen:  WD/WN, NAD. Appears younger than stated age Head: [place]/AT, No temporalis wasting. Ear/Nose/Throat: Hearing grossly intact, nares w/o erythema or drainage, trachea midline Eyes: Conjunctiva clear. Sclera non-icteric Neck: Supple.  No JVD.  Pulmonary:  Good air movement, no use of accessory muscles.  Cardiac: Irregularly irregular Vascular:  Vessel Right Left  Radial Palpable Palpable                                   Gastrointestinal: soft, non-tender/non-distended. No guarding/reflex.  Musculoskeletal: M/S 5/5 throughout.  No deformity or atrophy. 1+ right lower extremity edema and 1+ left lower extremity edema. Walks with a walker Neurologic: Sensation grossly intact in extremities.  Symmetrical.  Speech is fluent.  Psychiatric: Judgment intact, Mood & affect appropriate for pt's clinical situation. Dermatologic: No rashes or ulcers noted.  No cellulitis or open wounds. Lymph : No Cervical, Axillary, or Inguinal lymphadenopathy.     Labs No results found for this or any previous visit (from the past 2160 hour(s)).  Radiology No results found.    Assessment/Plan  No problem-specific Assessment & Plan notes found for this encounter.    Leotis Pain, MD  05/30/2016 5:09 PM    This note was created with Dragon medical transcription system.  Any errors from  dictation are purely unintentional

## 2016-05-31 NOTE — Assessment & Plan Note (Signed)
Doing better.  Continue elevation, compression and increased activity. Recheck in 3 months

## 2016-05-31 NOTE — Assessment & Plan Note (Signed)
blood glucose control important in reducing the progression of atherosclerotic disease. Also, involved in wound healing. On appropriate medications.  

## 2016-06-04 ENCOUNTER — Emergency Department: Payer: Medicare Other

## 2016-06-04 ENCOUNTER — Encounter: Payer: Self-pay | Admitting: Emergency Medicine

## 2016-06-04 ENCOUNTER — Emergency Department
Admission: EM | Admit: 2016-06-04 | Discharge: 2016-06-04 | Disposition: A | Discharge status: Home / Self Care | Payer: Medicare Other | Attending: Emergency Medicine | Admitting: Emergency Medicine

## 2016-06-04 DIAGNOSIS — Z79899 Other long term (current) drug therapy: Secondary | ICD-10-CM | POA: Diagnosis not present

## 2016-06-04 DIAGNOSIS — I10 Essential (primary) hypertension: Secondary | ICD-10-CM | POA: Insufficient documentation

## 2016-06-04 DIAGNOSIS — W19XXXA Unspecified fall, initial encounter: Secondary | ICD-10-CM

## 2016-06-04 DIAGNOSIS — S0083XA Contusion of other part of head, initial encounter: Secondary | ICD-10-CM | POA: Diagnosis not present

## 2016-06-04 DIAGNOSIS — Z7984 Long term (current) use of oral hypoglycemic drugs: Secondary | ICD-10-CM | POA: Diagnosis not present

## 2016-06-04 DIAGNOSIS — Z7901 Long term (current) use of anticoagulants: Secondary | ICD-10-CM | POA: Diagnosis not present

## 2016-06-04 DIAGNOSIS — S022XXA Fracture of nasal bones, initial encounter for closed fracture: Secondary | ICD-10-CM | POA: Insufficient documentation

## 2016-06-04 DIAGNOSIS — S0990XA Unspecified injury of head, initial encounter: Secondary | ICD-10-CM | POA: Diagnosis present

## 2016-06-04 DIAGNOSIS — Z7982 Long term (current) use of aspirin: Secondary | ICD-10-CM | POA: Insufficient documentation

## 2016-06-04 DIAGNOSIS — S8002XA Contusion of left knee, initial encounter: Secondary | ICD-10-CM | POA: Insufficient documentation

## 2016-06-04 DIAGNOSIS — Y9389 Activity, other specified: Secondary | ICD-10-CM | POA: Insufficient documentation

## 2016-06-04 DIAGNOSIS — E119 Type 2 diabetes mellitus without complications: Secondary | ICD-10-CM | POA: Insufficient documentation

## 2016-06-04 DIAGNOSIS — S8992XA Unspecified injury of left lower leg, initial encounter: Secondary | ICD-10-CM

## 2016-06-04 DIAGNOSIS — Y92002 Bathroom of unspecified non-institutional (private) residence single-family (private) house as the place of occurrence of the external cause: Secondary | ICD-10-CM | POA: Diagnosis not present

## 2016-06-04 DIAGNOSIS — Z950 Presence of cardiac pacemaker: Secondary | ICD-10-CM | POA: Insufficient documentation

## 2016-06-04 DIAGNOSIS — Y999 Unspecified external cause status: Secondary | ICD-10-CM | POA: Diagnosis not present

## 2016-06-04 DIAGNOSIS — W1809XA Striking against other object with subsequent fall, initial encounter: Secondary | ICD-10-CM | POA: Diagnosis not present

## 2016-06-04 LAB — URINALYSIS, COMPLETE (UACMP) WITH MICROSCOPIC
BACTERIA UA: NONE SEEN
Bilirubin Urine: NEGATIVE
Glucose, UA: NEGATIVE mg/dL
Hgb urine dipstick: NEGATIVE
KETONES UR: NEGATIVE mg/dL
LEUKOCYTES UA: NEGATIVE
Nitrite: NEGATIVE
PROTEIN: NEGATIVE mg/dL
SQUAMOUS EPITHELIAL / LPF: NONE SEEN
Specific Gravity, Urine: 1.006 (ref 1.005–1.030)
pH: 5 (ref 5.0–8.0)

## 2016-06-04 LAB — COMPREHENSIVE METABOLIC PANEL
ALBUMIN: 4.3 g/dL (ref 3.5–5.0)
ALK PHOS: 62 U/L (ref 38–126)
ALT: 14 U/L (ref 14–54)
AST: 24 U/L (ref 15–41)
Anion gap: 12 (ref 5–15)
BILIRUBIN TOTAL: 1.2 mg/dL (ref 0.3–1.2)
BUN: 20 mg/dL (ref 6–20)
CALCIUM: 9.3 mg/dL (ref 8.9–10.3)
CO2: 29 mmol/L (ref 22–32)
CREATININE: 1.03 mg/dL — AB (ref 0.44–1.00)
Chloride: 97 mmol/L — ABNORMAL LOW (ref 101–111)
GFR calc Af Amer: 53 mL/min — ABNORMAL LOW (ref >60)
GFR calc non Af Amer: 46 mL/min — ABNORMAL LOW (ref >60)
GLUCOSE: 128 mg/dL — AB (ref 65–99)
Potassium: 2.9 mmol/L — ABNORMAL LOW (ref 3.5–5.1)
SODIUM: 138 mmol/L (ref 135–145)
TOTAL PROTEIN: 7.4 g/dL (ref 6.5–8.1)

## 2016-06-04 LAB — CBC WITH DIFFERENTIAL/PLATELET
Basophils Absolute: 0.1 10*3/uL (ref 0–0.1)
Basophils Relative: 1 %
EOS ABS: 0.1 10*3/uL (ref 0–0.7)
Eosinophils Relative: 1 %
HEMATOCRIT: 37.5 % (ref 35.0–47.0)
HEMOGLOBIN: 12.5 g/dL (ref 12.0–16.0)
LYMPHS ABS: 1 10*3/uL (ref 1.0–3.6)
Lymphocytes Relative: 15 %
MCH: 31.4 pg (ref 26.0–34.0)
MCHC: 33.3 g/dL (ref 32.0–36.0)
MCV: 94.3 fL (ref 80.0–100.0)
MONOS PCT: 12 %
Monocytes Absolute: 0.8 10*3/uL (ref 0.2–0.9)
NEUTROS ABS: 4.8 10*3/uL (ref 1.4–6.5)
NEUTROS PCT: 71 %
Platelets: 125 10*3/uL — ABNORMAL LOW (ref 150–440)
RBC: 3.97 MIL/uL (ref 3.80–5.20)
RDW: 15 % — ABNORMAL HIGH (ref 11.5–14.5)
WBC: 6.8 10*3/uL (ref 3.6–11.0)

## 2016-06-04 LAB — PROTIME-INR
INR: 2.14
Prothrombin Time: 24.3 seconds — ABNORMAL HIGH (ref 11.4–15.2)

## 2016-06-04 LAB — TROPONIN I: Troponin I: <0.03 ng/mL (ref <0.03)

## 2016-06-04 MED ORDER — TRAMADOL HCL 50 MG PO TABS: 50.0000 mg | ORAL_TABLET | Freq: Four times a day (QID) | ORAL | 0 refills | Status: AC | PRN

## 2016-06-04 MED ORDER — ACETAMINOPHEN 500 MG PO TABS
ORAL_TABLET | ORAL | Status: AC
Start: 2016-06-04 — End: 2016-06-04
  Administered 2016-06-04: 1000 mg via ORAL
  Filled 2016-06-04: qty 2

## 2016-06-04 MED ORDER — BACITRACIN ZINC 500 UNIT/GM EX OINT
TOPICAL_OINTMENT | Freq: Once | CUTANEOUS | Status: AC
  Administered 2016-06-04: 1 via TOPICAL
  Filled 2016-06-04: qty 0.9

## 2016-06-04 MED ORDER — ACETAMINOPHEN 500 MG PO TABS
1000.0000 mg | ORAL_TABLET | Freq: Once | ORAL | Status: AC
Start: 2016-06-04 — End: 2016-06-04
  Administered 2016-06-04: 1000 mg via ORAL

## 2016-06-04 NOTE — ED Notes (Signed)
Patient transported to X-ray 

## 2016-06-04 NOTE — ED Triage Notes (Signed)
Pt presents to ED via [place] from home after she fell of her toilet after urinating. Pt hit her head with laceration noted to her forehead and the bridge of her nose. Bruising also noted to pt face, head, backs of her hands, and left knee. Pt states she has been "feeling dizzy all week" with frequent diarrhea for the past several weeks. Pt states she has been taking imodium with no relief. EMS VS 135/68 HR 130

## 2016-06-04 NOTE — Discharge Instructions (Signed)
Please seek medical attention for any high fevers, chest pain, shortness of breath, change in behavior, persistent vomiting, bloody stool or any other new or concerning symptoms.  

## 2016-06-04 NOTE — ED Notes (Signed)

## 2016-06-04 NOTE — ED Provider Notes (Signed)
[facility] Emergency Department Provider Note    ____________________________________________   I have reviewed the triage vital signs and the nursing notes.   HISTORY  Chief Complaint Fall; Facial Laceration; and Knee Injury   History limited by: Not Limited   HPI Debra Butler is a 83 y.o. female who presents to the emergency department today after a fall. The patient was getting up off of the commode when she fell forward. She denies any chest pain or palpitations at that time. Did not feel lightheaded. She did not pass out. She states that she is followed like this before in the past. She hit the face and head and left knee hard against the floor. She did not lose consciousness. She states that right now she has a little headache and some pain in her left knee. She does state that she has been having diarrhea for the past couple of weeks. No associated abdominal pain or vomiting.   Past Medical History:  Diagnosis Date  . Blood clot in vein 2013   left leg  . Diabetes mellitus without complication ([place]) 0000000  . Hypertension 1988  . IBS (irritable bowel syndrome)   . Myocardial infarction     Patient Active Problem List   Diagnosis Date Noted  . Diabetes ([place]) 04/03/2016  . Lymphedema 04/03/2016  . Swelling of limb 04/03/2016    Past Surgical History:  Procedure Laterality Date  . ABDOMINAL HYSTERECTOMY  age 73   partial, still has ovaries and tubes  . APPENDECTOMY    . BACK SURGERY    . BREAST BIOPSY     at age 29, benign  . CARPAL TUNNEL RELEASE Right 2010  . CHOLECYSTECTOMY    . COLON SURGERY  1984  . COLONOSCOPY  2007, 2016   Dr Jamal Collin  . FOOT SURGERY    . FRACTURE SURGERY  2010   hip  . FRACTURE SURGERY  2009   femur  . JOINT REPLACEMENT     x 2  . PACEMAKER PLACEMENT  2013  . TONSILLECTOMY    . UPPER GI ENDOSCOPY  2016   Dr Jamal Collin    Prior to Admission medications   Medication Sig Start Date End Date Taking?  Authorizing Provider  acetaminophen (TYLENOL) 325 MG tablet Take 650 mg by mouth every 6 (six) hours as needed.    Historical Provider, MD  aspirin 81 MG tablet Take 81 mg by mouth daily.    Historical Provider, MD  diltiazem (DILACOR XR) 180 MG 24 hr capsule Take 180 mg by mouth daily.    Historical Provider, MD  furosemide (LASIX) 20 MG tablet Take 20 mg by mouth daily.    Historical Provider, MD  hydrocortisone-pramoxine (ANALPRAM HC) 2.5-1 % rectal cream Place 1 application rectally 3 (three) times daily. 09/08/14   Seeplaputhur Robinette Haines, MD  levothyroxine (SYNTHROID, LEVOTHROID) 112 MCG tablet Take 112 mcg by mouth daily before breakfast.    Historical Provider, MD  metFORMIN (GLUCOPHAGE) 500 MG tablet Take 1,000 mg by mouth 2 (two) times daily with a meal.     Historical Provider, MD  nitroGLYCERIN 2.5 MG CR capsule Take 2.5 mg by mouth as needed.    Historical Provider, MD  ondansetron (ZOFRAN) 8 MG tablet Take by mouth every 8 (eight) hours as needed for nausea or vomiting.    Historical Provider, MD  potassium chloride (KLOR-CON) 20 MEQ packet Take 20 mEq by mouth 2 (two) times daily.    Historical Provider, MD  pregabalin (LYRICA) 100 MG capsule Take 100 mg by mouth 2 (two) times daily.    Historical Provider, MD  sitaGLIPtin (JANUVIA) 100 MG tablet Take 100 mg by mouth daily.    Historical Provider, MD  solifenacin (VESICARE) 5 MG tablet Take 5 mg by mouth daily.    Historical Provider, MD  warfarin (COUMADIN) 6 MG tablet Take 6 mg by mouth one time only at 6 PM.    Historical Provider, MD    Allergies Phenergan [promethazine] and [place] [sulfur]  History reviewed. No pertinent family history.  Social History Social History  Substance Use Topics  . Smoking status: Never Smoker  . Smokeless tobacco: Never Used  . Alcohol use No    Review of Systems  Constitutional: Negative for fever. Cardiovascular: Negative for chest pain. Respiratory: Negative for shortness of  breath. Gastrointestinal: Negative for abdominal pain, vomiting and diarrhea. Genitourinary: Negative for dysuria. Musculoskeletal: Positive for left knee pain. Neurological: Positive for headache.  10-point ROS otherwise negative.  ____________________________________________   PHYSICAL EXAM:  VITAL SIGNS: ED Triage Vitals  Enc Vitals Group     BP --      Pulse Rate 06/04/16 0645 97     Resp 06/04/16 0645 16     Temp 06/04/16 0645 98.4 F (36.9 C)     Temp Source 06/04/16 0645 Oral     SpO2 06/04/16 0645 100 %     Weight 06/04/16 0646 190 lb (86.2 kg)     Height 06/04/16 0646 5\' 1"  (1.549 m)     Head Circumference --      Peak Flow --      Pain Score 06/04/16 0647 2    Constitutional: Alert and oriented. Well appearing and in no distress. Eyes: Conjunctivae are normal. Normal extraocular movements. ENT   Head: Normocephalic. Bruising around bilateral eyes. Hematoma to forehead with superficial 1.5 cm laceration. Ears without hemotympanum bilaterally.   Nose: Some dried blood in nares.    Mouth/Throat: Mucous membranes are moist.   Neck: No stridor. No midline tenderness.  Hematological/Lymphatic/Immunilogical: No cervical lymphadenopathy. Cardiovascular: Normal rate, regular rhythm.  No murmurs, rubs, or gallops.  Respiratory: Normal respiratory effort without tachypnea nor retractions. Breath sounds are clear and equal bilaterally. No wheezes/rales/rhonchi. Gastrointestinal: Soft and non tender. No rebound. No guarding.  Genitourinary: Deferred Musculoskeletal: Normal range of motion in all extremities. No lower extremity edema. Neurologic:  Normal speech and language. No gross focal neurologic deficits are appreciated.  Skin:  Superficial laceration to forehead. Bruising around bilateral eyes. Bruising to left knee.  Psychiatric: Mood and affect are normal. Speech and behavior are normal. Patient exhibits appropriate insight and  judgment.  ____________________________________________    LABS (pertinent positives/negatives)  Labs Reviewed  CBC WITH DIFFERENTIAL/PLATELET - Abnormal; Notable for the following:       Result Value   RDW 15.0 (*)    Platelets 125 (*)    All other components within normal limits  COMPREHENSIVE METABOLIC PANEL - Abnormal; Notable for the following:    Potassium 2.9 (*)    Chloride 97 (*)    Glucose, Bld 128 (*)    Creatinine, Ser 1.03 (*)    GFR calc non Af Amer 46 (*)    GFR calc Af Amer 53 (*)    All other components within normal limits  PROTIME-INR - Abnormal; Notable for the following:    Prothrombin Time 24.3 (*)    All other components within normal limits  URINALYSIS, COMPLETE (UACMP) WITH MICROSCOPIC -  Abnormal; Notable for the following:    Color, Urine YELLOW (*)    APPearance CLEAR (*)    All other components within normal limits  TROPONIN I  TROPONIN I     ____________________________________________   EKG  I, Nance Pear, attending physician, personally viewed and interpreted this EKG  EKG Time: 0645 Rate: 102 Rhythm: atrial fibrillation Axis: normal Intervals: qtc 491 QRS: narrow ST changes: no st elevation Impression: abnormal ekg   ____________________________________________    RADIOLOGY  CT head/max face/cervical spine IMPRESSION: 1. Acute trauma to the frontal scalp and bridge of the nose where there are hematomas, as discussed above. In addition, there are minimally displaced fractures through the nasal bones bilaterally and the anterior aspect of the bony nasal septum. 2. No acute intracranial abnormalities. 3. No evidence of significant acute traumatic injury to the cervical spine. 4. Mild chronic microvascular ischemic changes in the cerebral white matter, as above. 5. Multilevel degenerative disc disease and cervical spondylosis, as above.  Left knee IMPRESSION:  1. No acute radiographic abnormality of the bones of  the left knee  in this patient status post left knee total arthroplasty.   ____________________________________________   PROCEDURES  Procedures  ____________________________________________   INITIAL IMPRESSION / ASSESSMENT AND PLAN / ED COURSE  Pertinent labs & imaging results that were available during my care of the patient were reviewed by me and considered in my medical decision making (see chart for details).  Patient presented to the emergency department today after a fall. 2 sets of troponin negative. No signs of infection. Patient's CT scans negative save for possible nasal fracture. Patient had a small laceration on her forehead however is very superficial and would not and affect from sutures. Will plan on discharging patient home. Patient can get CNA to stay with her.  ____________________________________________   FINAL CLINICAL IMPRESSION(S) / ED DIAGNOSES  Final diagnoses:  Fall  Fall, initial encounter  Contusion of face, initial encounter  Injury of left knee, initial encounter  Closed fracture of nasal bone, initial encounter     Note: This dictation was prepared with Dragon dictation. Any transcriptional errors that result from this process are unintentional     Nance Pear, MD 06/04/16 1236

## 2016-06-16 ENCOUNTER — Encounter: Payer: Self-pay | Admitting: Emergency Medicine

## 2016-06-16 ENCOUNTER — Emergency Department
Admission: EM | Admit: 2016-06-16 | Discharge: 2016-06-17 | Disposition: A | Discharge status: Home / Self Care | Payer: Medicare Other | Attending: Emergency Medicine | Admitting: Emergency Medicine

## 2016-06-16 ENCOUNTER — Emergency Department: Payer: Medicare Other

## 2016-06-16 DIAGNOSIS — Y999 Unspecified external cause status: Secondary | ICD-10-CM | POA: Diagnosis not present

## 2016-06-16 DIAGNOSIS — S0990XA Unspecified injury of head, initial encounter: Secondary | ICD-10-CM | POA: Diagnosis present

## 2016-06-16 DIAGNOSIS — W19XXXA Unspecified fall, initial encounter: Secondary | ICD-10-CM | POA: Insufficient documentation

## 2016-06-16 DIAGNOSIS — S01511A Laceration without foreign body of lip, initial encounter: Secondary | ICD-10-CM | POA: Diagnosis not present

## 2016-06-16 DIAGNOSIS — Y939 Activity, unspecified: Secondary | ICD-10-CM | POA: Insufficient documentation

## 2016-06-16 DIAGNOSIS — I10 Essential (primary) hypertension: Secondary | ICD-10-CM | POA: Diagnosis not present

## 2016-06-16 DIAGNOSIS — Z79899 Other long term (current) drug therapy: Secondary | ICD-10-CM | POA: Insufficient documentation

## 2016-06-16 DIAGNOSIS — Z7984 Long term (current) use of oral hypoglycemic drugs: Secondary | ICD-10-CM | POA: Diagnosis not present

## 2016-06-16 DIAGNOSIS — E119 Type 2 diabetes mellitus without complications: Secondary | ICD-10-CM | POA: Diagnosis not present

## 2016-06-16 DIAGNOSIS — Z7982 Long term (current) use of aspirin: Secondary | ICD-10-CM | POA: Diagnosis not present

## 2016-06-16 DIAGNOSIS — Z7901 Long term (current) use of anticoagulants: Secondary | ICD-10-CM | POA: Diagnosis not present

## 2016-06-16 DIAGNOSIS — R791 Abnormal coagulation profile: Secondary | ICD-10-CM | POA: Diagnosis not present

## 2016-06-16 DIAGNOSIS — Y92009 Unspecified place in unspecified non-institutional (private) residence as the place of occurrence of the external cause: Secondary | ICD-10-CM | POA: Insufficient documentation

## 2016-06-16 LAB — PROTIME-INR
INR: 6.4 — AB
PROTHROMBIN TIME: 58.3 s — AB (ref 11.4–15.2)

## 2016-06-16 LAB — CBC WITH DIFFERENTIAL/PLATELET
BASOS ABS: 0.1 10*3/uL (ref 0–0.1)
Basophils Relative: 1 %
EOS PCT: 1 %
Eosinophils Absolute: 0 10*3/uL (ref 0–0.7)
HEMATOCRIT: 36 % (ref 35.0–47.0)
Hemoglobin: 11.8 g/dL — ABNORMAL LOW (ref 12.0–16.0)
LYMPHS PCT: 9 %
Lymphs Abs: 0.6 10*3/uL — ABNORMAL LOW (ref 1.0–3.6)
MCH: 30.6 pg (ref 26.0–34.0)
MCHC: 32.9 g/dL (ref 32.0–36.0)
MCV: 92.9 fL (ref 80.0–100.0)
MONO ABS: 0.7 10*3/uL (ref 0.2–0.9)
Monocytes Relative: 11 %
Neutro Abs: 5 10*3/uL (ref 1.4–6.5)
Neutrophils Relative %: 78 %
PLATELETS: 145 10*3/uL — AB (ref 150–440)
RBC: 3.87 MIL/uL (ref 3.80–5.20)
RDW: 15 % — AB (ref 11.5–14.5)
WBC: 6.4 10*3/uL (ref 3.6–11.0)

## 2016-06-16 LAB — BASIC METABOLIC PANEL
ANION GAP: 11 (ref 5–15)
BUN: 16 mg/dL (ref 6–20)
CALCIUM: 9.5 mg/dL (ref 8.9–10.3)
CO2: 31 mmol/L (ref 22–32)
Chloride: 98 mmol/L — ABNORMAL LOW (ref 101–111)
Creatinine, Ser: 0.92 mg/dL (ref 0.44–1.00)
GFR, EST NON AFRICAN AMERICAN: 52 mL/min — AB (ref >60)
Glucose, Bld: 144 mg/dL — ABNORMAL HIGH (ref 65–99)
POTASSIUM: 3.1 mmol/L — AB (ref 3.5–5.1)
Sodium: 140 mmol/L (ref 135–145)

## 2016-06-16 LAB — GLUCOSE, CAPILLARY: Glucose-Capillary: 101 mg/dL — ABNORMAL HIGH (ref 65–99)

## 2016-06-16 MED ORDER — PHYTONADIONE 5 MG PO TABS
2.5000 mg | ORAL_TABLET | Freq: Once | ORAL | Status: AC
  Administered 2016-06-16: 2.5 mg via ORAL
  Filled 2016-06-16: qty 1

## 2016-06-16 MED ORDER — ACETAMINOPHEN 325 MG PO TABS
650.0000 mg | ORAL_TABLET | Freq: Once | ORAL | Status: AC
  Administered 2016-06-16: 650 mg via ORAL
  Filled 2016-06-16: qty 2

## 2016-06-16 NOTE — ED Triage Notes (Signed)
Pt to rm 6 via EMS from home, report bit lip and won't stop bleeding, reports using blood thinners.  PT also report hematoma to forehead, report fell 2 weeks ago, and pt also report blood at back of throat, noticed when clearing throat.  Pt NAD at this time.

## 2016-06-16 NOTE — ED Notes (Addendum)
Large hematoma noted to forehead, bruising noted to face, neck, and chest, pt states from fall 2 weeks ago, reports fractured nasal bones

## 2016-06-16 NOTE — ED Provider Notes (Signed)
[facility] Emergency Department Provider Note  ____________________________________________   First MD Initiated Contact with Patient 06/16/16 2011     (approximate)  I have reviewed the triage vital signs and the nursing notes.   HISTORY  Chief Complaint Mouth Injury and Head Injury   HPI Debra Butler is a 83 y.o. female with a history of atrial fibrillation on Coumadin who is presenting emergency department today after biting her upper lip with persistent bleeding. She says that she also has coughed up several blood clots since this morning. She had a fall about 2 weeks ago and developed a large hematoma to her forehead. She says that she also has several nasal fractures and had some dried, lack of blood clots coming out of her nose. She says that this was the same coloration of the blood clot that she coughed up today. She says that the blood clot that she coughed up today were about the size of the tip of her pinky.   Past Medical History:  Diagnosis Date  . Blood clot in vein 2013   left leg  . Diabetes mellitus without complication ([place]) 0000000  . Hypertension 1988  . IBS (irritable bowel syndrome)   . Myocardial infarction     Patient Active Problem List   Diagnosis Date Noted  . Diabetes ([place]) 04/03/2016  . Lymphedema 04/03/2016  . Swelling of limb 04/03/2016    Past Surgical History:  Procedure Laterality Date  . ABDOMINAL HYSTERECTOMY  age 79   partial, still has ovaries and tubes  . APPENDECTOMY    . BACK SURGERY    . BREAST BIOPSY     at age 20, benign  . CARPAL TUNNEL RELEASE Right 2010  . CHOLECYSTECTOMY    . COLON SURGERY  1984  . COLONOSCOPY  2007, 2016   Dr Jamal Collin  . FOOT SURGERY    . FRACTURE SURGERY  2010   hip  . FRACTURE SURGERY  2009   femur  . JOINT REPLACEMENT     x 2  . PACEMAKER PLACEMENT  2013  . TONSILLECTOMY    . UPPER GI ENDOSCOPY  2016   Dr Jamal Collin    Prior to Admission medications   Medication  Sig Start Date End Date Taking? Authorizing Provider  acetaminophen (TYLENOL) 325 MG tablet Take 650 mg by mouth every 6 (six) hours as needed.    Historical Provider, MD  aspirin 81 MG tablet Take 81 mg by mouth daily.    Historical Provider, MD  diltiazem (DILACOR XR) 180 MG 24 hr capsule Take 180 mg by mouth daily.    Historical Provider, MD  furosemide (LASIX) 20 MG tablet Take 20 mg by mouth daily.    Historical Provider, MD  hydrocortisone-pramoxine (ANALPRAM HC) 2.5-1 % rectal cream Place 1 application rectally 3 (three) times daily. 09/08/14   Seeplaputhur Robinette Haines, MD  levothyroxine (SYNTHROID, LEVOTHROID) 112 MCG tablet Take 112 mcg by mouth daily before breakfast.    Historical Provider, MD  metFORMIN (GLUCOPHAGE) 500 MG tablet Take 1,000 mg by mouth 2 (two) times daily with a meal.     Historical Provider, MD  nitroGLYCERIN 2.5 MG CR capsule Take 2.5 mg by mouth as needed.    Historical Provider, MD  ondansetron (ZOFRAN) 8 MG tablet Take by mouth every 8 (eight) hours as needed for nausea or vomiting.    Historical Provider, MD  potassium chloride (KLOR-CON) 20 MEQ packet Take 20 mEq by mouth 2 (two) times  daily.    Historical Provider, MD  pregabalin (LYRICA) 100 MG capsule Take 100 mg by mouth 2 (two) times daily.    Historical Provider, MD  sitaGLIPtin (JANUVIA) 100 MG tablet Take 100 mg by mouth daily.    Historical Provider, MD  solifenacin (VESICARE) 5 MG tablet Take 5 mg by mouth daily.    Historical Provider, MD  traMADol (ULTRAM) 50 MG tablet Take 1 tablet (50 mg total) by mouth every 6 (six) hours as needed. 06/04/16 06/04/17  Nance Pear, MD  warfarin (COUMADIN) 6 MG tablet Take 6 mg by mouth one time only at 6 PM.    Historical Provider, MD    Allergies Phenergan [promethazine] and [place] [sulfur]  History reviewed. No pertinent family history.  Social History Social History  Substance Use Topics  . Smoking status: Never Smoker  . Smokeless tobacco: Never Used  .  Alcohol use No    Review of Systems Constitutional: No fever/chills Eyes: No visual changes. ENT: No sore throat. Cardiovascular: Denies chest pain. Respiratory: Denies shortness of breath. Gastrointestinal: No abdominal pain.  No nausea, no vomiting.  No diarrhea.  No constipation. Genitourinary: Negative for dysuria. Musculoskeletal: Negative for back pain. Skin: Negative for rash. Neurological: Negative for headaches, focal weakness or numbness.  10-point ROS otherwise negative.  ____________________________________________   PHYSICAL EXAM:  VITAL SIGNS: ED Triage Vitals  Enc Vitals Group     BP 06/16/16 2009 (!) 145/97     Pulse Rate 06/16/16 2009 89     Resp 06/16/16 2009 16     Temp 06/16/16 2009 98.6 F (37 C)     Temp src --      SpO2 06/16/16 2009 99 %     Weight 06/16/16 2013 190 lb (86.2 kg)     Height 06/16/16 2013 5\' 1"  (1.549 m)     Head Circumference --      Peak Flow --      Pain Score 06/16/16 2013 0     Pain Loc --      Pain Edu? --      Excl. in [place]? --     Constitutional: Alert and oriented. Well appearing and in no acute distress. Eyes: Conjunctivae are normal. PERRL. EOMI. Head: 3 cm x 4 cm, diamond shaped hematoma between eyebrows. It is raised with about a 2 cm height. It is not boggy. There is no depression or tenderness to palpation. Some of the blood appears to have tracked down to the bilateral sides of the nose and to the cheeks, bilaterally. Nose: No congestion/rhinnorhea. No active bleeding. Mouth/Throat: Mucous membranes are moist.  Oropharynx non-erythematous.  Superficial, 1 cm laceration to the mucosal surface inside the mouth just behind the 2 front teeth. It is oozing blood. There is no pulsatile bleeding. No hemoptysis or hematemesis at this time. Neck: No stridor.   Cardiovascular: Normal rate, regular rhythm. Grossly normal heart sounds. Respiratory: Normal respiratory effort.  No retractions. Lungs CTAB. Gastrointestinal: Soft  and nontender. No distention.  Musculoskeletal: No lower extremity tenderness nor edema.  Neurologic:  Normal speech and language. No gross focal neurologic deficits are appreciated. Skin:  Skin is warm, dry and intact. No rash noted. Psychiatric: Mood and affect are normal. Speech and behavior are normal.  ____________________________________________   LABS (all labs ordered are listed, but only abnormal results are displayed)  Labs Reviewed  PROTIME-INR - Abnormal; Notable for the following:       Result Value   Prothrombin Time 58.3 (*)  INR 6.40 (*)    All other components within normal limits  CBC WITH DIFFERENTIAL/PLATELET - Abnormal; Notable for the following:    Hemoglobin 11.8 (*)    RDW 15.0 (*)    Platelets 145 (*)    Lymphs Abs 0.6 (*)    All other components within normal limits  BASIC METABOLIC PANEL - Abnormal; Notable for the following:    Potassium 3.1 (*)    Chloride 98 (*)    Glucose, Bld 144 (*)    GFR calc non Af Amer 52 (*)    All other components within normal limits   ____________________________________________  EKG   ____________________________________________  RADIOLOGY  CT Head Wo Contrast (Final result)  Result time 06/16/16 22:44:26  Final result by Elon Alas, MD (06/16/16 22:44:26)           Narrative:   CLINICAL DATA: Bit lip, continued bleeding. Forehead hematoma after fall 2 weeks ago. On blood thinners.  EXAM: CT HEAD WITHOUT CONTRAST  TECHNIQUE: Contiguous axial images were obtained from the base of the skull through the vertex without intravenous contrast.  COMPARISON: CT HEAD June 04, 1998 18  FINDINGS: BRAIN: The ventricles and sulci are normal for age. No intraparenchymal hemorrhage, mass effect nor midline shift. Minimal supratentorial white matter hypodensities less than expected for patient's age, though non-specific are most compatible with chronic small vessel ischemic disease. No acute large  vascular territory infarcts. No abnormal extra-axial fluid collections. Basal cisterns are patent.  VASCULAR: Moderate calcific atherosclerosis of the carotid siphons.  SKULL: No skull fracture. Subcentimeter sessile exostosis LEFT frontal calvarium consistent with meningioma. Similar large frontal scalp hematoma.  SINUSES/ORBITS: The mastoid air-cells and included paranasal sinuses are well-aerated. Status post bilateral ocular lens implants. The included ocular globes and orbital contents are non-suspicious.  OTHER: None.  IMPRESSION: Similar large frontal scalp hematoma. No skull fracture. No acute intracranial process.  Small LEFT frontal meningioma, otherwise normal CT HEAD for age.   Electronically Signed By: Elon Alas M.D. On: 06/16/2016 22:44          ____________________________________________   PROCEDURES  Procedure(s) performed:   Procedures  Critical Care performed:   ____________________________________________   INITIAL IMPRESSION / ASSESSMENT AND PLAN / ED COURSE  Pertinent labs & imaging results that were available during my care of the patient were reviewed by me and considered in my medical decision making (see chart for details).  ----------------------------------------- 11:26 PM on 06/16/2016 -----------------------------------------  Bleeding has stopped at this point. The Surgicel has been removed. Patient had been complaining of frontal headache as well. CAT scan without any acute findings just a meningioma which appears unchanged on my review from the CAT scan of January 7. The patient will hold her next dose of Coumadin. We will also send her home with Surgicel. She'll be contacting her primary care doctor this Monday for possible adjustment in her Coumadin. She is understanding of the plan and willing to comply. Bleeding with blood clots in the mouth also likely related from supratherapeutic levels of Coumadin and recent  nasal fracture. I'll give her a small dose of vitamin K as well. She is understanding the plan and willing to comply.      ____________________________________________   FINAL CLINICAL IMPRESSION(S) / ED DIAGNOSES  Lip laceration. Supratherapeutic Coumadin level.    NEW MEDICATIONS STARTED DURING THIS VISIT:  New Prescriptions   No medications on file     Note:  This document was prepared using Dragon voice recognition software and  may include unintentional dictation errors.    Orbie Pyo, MD 06/16/16 [phone]

## 2016-06-17 NOTE — ED Notes (Signed)
[employer] called for pt, voucher provided after consulting with charge nurse.  [facility] desk staff aware pt is waiting for taxi

## 2016-07-21 ENCOUNTER — Ambulatory Visit (INDEPENDENT_AMBULATORY_CARE_PROVIDER_SITE_OTHER): Payer: Medicare Other | Admitting: Vascular Surgery

## 2016-07-21 ENCOUNTER — Encounter (INDEPENDENT_AMBULATORY_CARE_PROVIDER_SITE_OTHER): Payer: Self-pay | Admitting: Vascular Surgery

## 2016-07-21 VITALS — BP 116/68 | HR 60 | Resp 16 | Wt 199.0 lb

## 2016-07-21 DIAGNOSIS — M7989 Other specified soft tissue disorders: Secondary | ICD-10-CM | POA: Diagnosis not present

## 2016-07-21 DIAGNOSIS — E118 Type 2 diabetes mellitus with unspecified complications: Secondary | ICD-10-CM

## 2016-07-21 DIAGNOSIS — I890 Lymphedema, not elsewhere classified: Secondary | ICD-10-CM

## 2016-07-21 NOTE — Progress Notes (Signed)
MRN : [id number]  Debra Butler is a 83 y.o. (1921/12/13) female who presents with chief complaint of  Chief Complaint  Patient presents with  . Follow-up  .  History of Present Illness: Patient returns today in follow up of Right leg swelling. She has gotten a lot worse in terms of swelling and has a blister her right medial calf which is large and full of fluid. This is painful. She does not have fever or chills.  Current Outpatient Prescriptions  Medication Sig Dispense Refill  . acetaminophen (TYLENOL) 325 MG tablet Take 650 mg by mouth every 6 (six) hours as needed.    Marland Kitchen aspirin 81 MG tablet Take 81 mg by mouth daily.    Marland Kitchen diltiazem (DILACOR XR) 180 MG 24 hr capsule Take 180 mg by mouth daily.    . furosemide (LASIX) 20 MG tablet Take 20 mg by mouth daily.    Marland Kitchen levothyroxine (SYNTHROID, LEVOTHROID) 112 MCG tablet Take 112 mcg by mouth daily before breakfast.    . metFORMIN (GLUCOPHAGE) 500 MG tablet Take 1,000 mg by mouth 2 (two) times daily with a meal.     . nitroGLYCERIN 2.5 MG CR capsule Take 2.5 mg by mouth as needed.    . ondansetron (ZOFRAN) 8 MG tablet Take by mouth every 8 (eight) hours as needed for nausea or vomiting.    . potassium chloride (KLOR-CON) 20 MEQ packet Take 20 mEq by mouth 2 (two) times daily.    . pregabalin (LYRICA) 100 MG capsule Take 100 mg by mouth 2 (two) times daily.    . sitaGLIPtin (JANUVIA) 100 MG tablet Take 100 mg by mouth daily.    Marland Kitchen warfarin (COUMADIN) 6 MG tablet Take 6 mg by mouth one time only at 6 PM.    . hydrocortisone-pramoxine (ANALPRAM HC) 2.5-1 % rectal cream Place 1 application rectally 3 (three) times daily. (Patient not taking: Reported on 03/28/2016) 30 g 2  . solifenacin (VESICARE) 5 MG tablet Take 5 mg by mouth daily.     No current facility-administered medications for this visit.         Past Medical History:  Diagnosis Date  . Blood clot in vein 2013   left leg  . Diabetes mellitus  without complication ([place]) 4854  . Hypertension 1988  . IBS (irritable bowel syndrome)   . Myocardial infarction          Past Surgical History:  Procedure Laterality Date  . ABDOMINAL HYSTERECTOMY  age 89   partial, still has ovaries and tubes  . APPENDECTOMY    . BACK SURGERY    . BREAST BIOPSY     at age 9, benign  . CARPAL TUNNEL RELEASE Right 2010  . CHOLECYSTECTOMY    . COLON SURGERY  1984  . COLONOSCOPY  2007, 2016   Dr Jamal Collin  . FOOT SURGERY    . FRACTURE SURGERY  2010   hip  . FRACTURE SURGERY  2009   femur  . JOINT REPLACEMENT     x 2  . PACEMAKER PLACEMENT  2013  . TONSILLECTOMY    . UPPER GI ENDOSCOPY  2016   Dr Jamal Collin    Social History     Social History  Substance Use Topics  . Smoking status: Never Smoker  . Smokeless tobacco: Never Used  . Alcohol use No     Family History No reported bleeding or clotting disorders      Allergies  Allergen Reactions  .  Sulphur [Sulfur]      REVIEW OF SYSTEMS (Negative unless checked)  Constitutional: '[]'$ Weight loss  '[]'$ Fever  '[]'$ Chills Cardiac: '[]'$ Chest pain   '[]'$ Chest pressure   '[x]'$ Palpitations   '[]'$ Shortness of breath when laying flat   '[]'$ Shortness of breath at rest   '[]'$ Shortness of breath with exertion. Vascular:  '[]'$ Pain in legs with walking   '[]'$ Pain in legs at rest   '[]'$ Pain in legs when laying flat   '[]'$ Claudication   '[]'$ Pain in feet when walking  '[]'$ Pain in feet at rest  '[]'$ Pain in feet when laying flat   '[]'$ History of DVT   '[]'$ Phlebitis   '[x]'$ Swelling in legs   '[]'$ Varicose veins   '[]'$ Non-healing ulcers Pulmonary:   '[]'$ Uses home oxygen   '[]'$ Productive cough   '[]'$ Hemoptysis   '[]'$ Wheeze  '[]'$ COPD   '[]'$ Asthma Neurologic:  '[]'$ Dizziness  '[]'$ Blackouts   '[]'$ Seizures   '[]'$ History of stroke   '[]'$ History of TIA  '[]'$ Aphasia   '[]'$ Temporary blindness   '[]'$ Dysphagia   '[]'$ Weakness or numbness in arms   '[]'$ Weakness or numbness in legs Musculoskeletal:  '[x]'$ Arthritis   '[x]'$ Joint swelling   '[]'$ Joint pain   '[]'$ Low  back pain Hematologic:  '[]'$ Easy bruising  '[]'$ Easy bleeding   '[]'$ Hypercoagulable state   '[]'$ Anemic   Gastrointestinal:  '[]'$ Blood in stool   '[]'$ Vomiting blood  '[]'$ Gastroesophageal reflux/heartburn   '[]'$ Abdominal pain Genitourinary:  '[]'$ Chronic kidney disease   '[]'$ Difficult urination  '[]'$ Frequent urination  '[]'$ Burning with urination   '[]'$ Hematuria Skin:  '[]'$ Rashes   '[]'$ Ulcers   '[]'$ Wounds Psychological:  '[]'$ History of anxiety   '[]'$  History of major depression.  Physical Examination  BP 138/82   Pulse 88   Resp 16   Ht 5' (1.524 m)   Wt 88 kg (194 lb)   BMI 37.89 kg/m  Gen:  WD/WN, NAD. Appears younger than stated age Head: [place]/AT, No temporalis wasting. Ear/Nose/Throat: Hearing grossly intact, nares w/o erythema or drainage, trachea midline Eyes: Conjunctiva clear. Sclera non-icteric Neck: Supple.  No JVD.  Pulmonary:  Good air movement, no use of accessory muscles.  Cardiac: Irregularly irregular Vascular:  Vessel Right Left  Radial Palpable Palpable                                   Gastrointestinal: soft, non-tender/non-distended. No guarding/reflex.  Musculoskeletal: M/S 5/5 throughout.  No deformity or atrophy. 2-3+ right lower extremity edema and 1-2+ left lower extremity edema. Neurologic: Sensation grossly intact in extremities.  Symmetrical.  Speech is fluent.  Psychiatric: Judgment intact, Mood & affect appropriate for pt's clinical situation. Dermatologic: Large fluid blisters present on the right calf. Lymph : No Cervical, Axillary, or Inguinal lymphadenopathy.      Labs Recent Results (from the past 2160 hour(s))  CBC with Differential     Status: Abnormal   Collection Time: 06/04/16  6:57 AM  Result Value Ref Range   WBC 6.8 3.6 - 11.0 K/uL   RBC 3.97 3.80 - 5.20 MIL/uL   Hemoglobin 12.5 12.0 - 16.0 g/dL   HCT 37.5 35.0 - 47.0 %   MCV 94.3 80.0 - 100.0 fL   MCH 31.4 26.0 - 34.0 pg   MCHC 33.3 32.0 - 36.0 g/dL   RDW 15.0 (H) 11.5 - 14.5 %     Platelets 125 (L) 150 - 440 K/uL   Neutrophils Relative % 71 %   Neutro Abs 4.8 1.4 - 6.5 K/uL   Lymphocytes Relative 15 %   Lymphs  Abs 1.0 1.0 - 3.6 K/uL   Monocytes Relative 12 %   Monocytes Absolute 0.8 0.2 - 0.9 K/uL   Eosinophils Relative 1 %   Eosinophils Absolute 0.1 0 - 0.7 K/uL   Basophils Relative 1 %   Basophils Absolute 0.1 0 - 0.1 K/uL  Comprehensive metabolic panel     Status: Abnormal   Collection Time: 06/04/16  6:57 AM  Result Value Ref Range   Sodium 138 135 - 145 mmol/L   Potassium 2.9 (L) 3.5 - 5.1 mmol/L   Chloride 97 (L) 101 - 111 mmol/L   CO2 29 22 - 32 mmol/L   Glucose, Bld 128 (H) 65 - 99 mg/dL   BUN 20 6 - 20 mg/dL   Creatinine, Ser 1.03 (H) 0.44 - 1.00 mg/dL   Calcium 9.3 8.9 - 10.3 mg/dL   Total Protein 7.4 6.5 - 8.1 g/dL   Albumin 4.3 3.5 - 5.0 g/dL   AST 24 15 - 41 U/L   ALT 14 14 - 54 U/L   Alkaline Phosphatase 62 38 - 126 U/L   Total Bilirubin 1.2 0.3 - 1.2 mg/dL   GFR calc non Af Amer 46 (L) >60 mL/min   GFR calc Af Amer 53 (L) >60 mL/min    Comment: (NOTE) The eGFR has been calculated using the CKD EPI equation. This calculation has not been validated in all clinical situations. eGFR's persistently <60 mL/min signify possible Chronic Kidney Disease.    Anion gap 12 5 - 15  Troponin I     Status: None   Collection Time: 06/04/16  6:57 AM  Result Value Ref Range   Troponin I <0.03 <0.03 ng/mL  Protime-INR     Status: Abnormal   Collection Time: 06/04/16  6:57 AM  Result Value Ref Range   Prothrombin Time 24.3 (H) 11.4 - 15.2 seconds   INR 2.14   Urinalysis, Complete w Microscopic     Status: Abnormal   Collection Time: 06/04/16  9:56 AM  Result Value Ref Range   Color, Urine YELLOW (A) YELLOW   APPearance CLEAR (A) CLEAR   Specific Gravity, Urine 1.006 1.005 - 1.030   pH 5.0 5.0 - 8.0   Glucose, UA NEGATIVE NEGATIVE mg/dL   Hgb urine dipstick NEGATIVE NEGATIVE   Bilirubin Urine NEGATIVE NEGATIVE   Ketones, ur NEGATIVE NEGATIVE  mg/dL   Protein, ur NEGATIVE NEGATIVE mg/dL   Nitrite NEGATIVE NEGATIVE   Leukocytes, UA NEGATIVE NEGATIVE   RBC / HPF 0-5 0 - 5 RBC/hpf   WBC, UA 0-5 0 - 5 WBC/hpf   Bacteria, UA NONE SEEN NONE SEEN   Squamous Epithelial / LPF NONE SEEN NONE SEEN  Troponin I     Status: None   Collection Time: 06/04/16  9:56 AM  Result Value Ref Range   Troponin I <0.03 <0.03 ng/mL  Protime-INR     Status: Abnormal   Collection Time: 06/16/16  8:18 PM  Result Value Ref Range   Prothrombin Time 58.3 (H) 11.4 - 15.2 seconds   INR 6.40 (HH)     Comment: CRITICAL RESULT CALLED TO, READ BACK BY AND VERIFIED WITH: CHISTINE MARTIN AT 2102 ON 06/16/16 BY SNJ   CBC with Differential     Status: Abnormal   Collection Time: 06/16/16  8:18 PM  Result Value Ref Range   WBC 6.4 3.6 - 11.0 K/uL   RBC 3.87 3.80 - 5.20 MIL/uL   Hemoglobin 11.8 (L) 12.0 - 16.0 g/dL   HCT  36.0 35.0 - 47.0 %   MCV 92.9 80.0 - 100.0 fL   MCH 30.6 26.0 - 34.0 pg   MCHC 32.9 32.0 - 36.0 g/dL   RDW 15.0 (H) 11.5 - 14.5 %   Platelets 145 (L) 150 - 440 K/uL   Neutrophils Relative % 78 %   Neutro Abs 5.0 1.4 - 6.5 K/uL   Lymphocytes Relative 9 %   Lymphs Abs 0.6 (L) 1.0 - 3.6 K/uL   Monocytes Relative 11 %   Monocytes Absolute 0.7 0.2 - 0.9 K/uL   Eosinophils Relative 1 %   Eosinophils Absolute 0.0 0 - 0.7 K/uL   Basophils Relative 1 %   Basophils Absolute 0.1 0 - 0.1 K/uL  Basic metabolic panel     Status: Abnormal   Collection Time: 06/16/16  8:18 PM  Result Value Ref Range   Sodium 140 135 - 145 mmol/L   Potassium 3.1 (L) 3.5 - 5.1 mmol/L   Chloride 98 (L) 101 - 111 mmol/L   CO2 31 22 - 32 mmol/L   Glucose, Bld 144 (H) 65 - 99 mg/dL   BUN 16 6 - 20 mg/dL   Creatinine, Ser 0.92 0.44 - 1.00 mg/dL   Calcium 9.5 8.9 - 10.3 mg/dL   GFR calc non Af Amer 52 (L) >60 mL/min   GFR calc Af Amer >60 >60 mL/min    Comment: (NOTE) The eGFR has been calculated using the CKD EPI equation. This calculation has not been validated in  all clinical situations. eGFR's persistently <60 mL/min signify possible Chronic Kidney Disease.    Anion gap 11 5 - 15  Glucose, capillary     Status: Abnormal   Collection Time: 06/16/16 11:37 PM  Result Value Ref Range   Glucose-Capillary 101 (H) 65 - 99 mg/dL    Radiology No results found.   Assessment/Plan Diabetes (HCC) blood glucose control important in reducing the progression of atherosclerotic disease. Also, involved in wound healing. On appropriate medications.   Lymphedema Right leg swelling is significantly worse today with a large blister fluid under the skin. Place in [employer].  Swelling of limb Right leg swelling is significantly worse today with a large blister fluid under the skin. Place in [employer].  No problem-specific Assessment & Plan notes found for this encounter.    Leotis Pain, MD  07/21/2016 4:23 PM    This note was created with Dragon medical transcription system.  Any errors from dictation are purely unintentional

## 2016-07-28 ENCOUNTER — Encounter (INDEPENDENT_AMBULATORY_CARE_PROVIDER_SITE_OTHER): Payer: Self-pay | Admitting: Vascular Surgery

## 2016-07-28 ENCOUNTER — Ambulatory Visit (INDEPENDENT_AMBULATORY_CARE_PROVIDER_SITE_OTHER): Payer: Medicare Other | Admitting: Vascular Surgery

## 2016-07-28 VITALS — BP 113/66 | HR 68 | Resp 16 | Ht 64.0 in | Wt 198.0 lb

## 2016-07-28 DIAGNOSIS — R600 Localized edema: Secondary | ICD-10-CM | POA: Diagnosis not present

## 2016-07-28 NOTE — Progress Notes (Signed)
History of Present Illness  There is no documented history at this time  Assessments & Plan   There are no diagnoses linked to this encounter.    Additional instructions  Subjective:  Patient presents with venous ulcer of the Right lower extremity.    Procedure:  3 layer unna wrap was placed Right lower extremity.   Plan:   Follow up in one week.   

## 2016-07-31 DIAGNOSIS — R600 Localized edema: Secondary | ICD-10-CM | POA: Insufficient documentation

## 2016-08-04 ENCOUNTER — Ambulatory Visit (INDEPENDENT_AMBULATORY_CARE_PROVIDER_SITE_OTHER): Payer: Medicare Other | Admitting: Vascular Surgery

## 2016-08-04 DIAGNOSIS — M7989 Other specified soft tissue disorders: Secondary | ICD-10-CM

## 2016-08-04 DIAGNOSIS — R600 Localized edema: Secondary | ICD-10-CM

## 2016-08-04 DIAGNOSIS — I890 Lymphedema, not elsewhere classified: Secondary | ICD-10-CM

## 2016-08-04 NOTE — Progress Notes (Signed)
History of Present Illness  There is no documented history at this time  Assessments & Plan   There are no diagnoses linked to this encounter.    Additional instructions  Subjective:  Patient presents with venous ulcer of the Right lower extremity.    Procedure:  3 layer unna wrap was placed Right lower extremity.   Plan:   Follow up in one week. wra

## 2016-08-11 ENCOUNTER — Encounter (INDEPENDENT_AMBULATORY_CARE_PROVIDER_SITE_OTHER): Payer: Self-pay | Admitting: Vascular Surgery

## 2016-08-11 ENCOUNTER — Ambulatory Visit (INDEPENDENT_AMBULATORY_CARE_PROVIDER_SITE_OTHER): Payer: Medicare Other | Admitting: Vascular Surgery

## 2016-08-11 VITALS — BP 122/72 | HR 81 | Resp 16 | Ht 64.0 in | Wt 195.0 lb

## 2016-08-11 DIAGNOSIS — R600 Localized edema: Secondary | ICD-10-CM

## 2016-08-11 NOTE — Progress Notes (Signed)
History of Present Illness  There is no documented history at this time  Assessments & Plan   There are no diagnoses linked to this encounter.    Additional instructions  Subjective:  Patient presents with venous ulcer of the Right lower extremity.    Procedure:  3 layer unna wrap was placed Right lower extremity.   Plan:   Follow up in one week.   

## 2016-08-18 ENCOUNTER — Ambulatory Visit (INDEPENDENT_AMBULATORY_CARE_PROVIDER_SITE_OTHER): Payer: Medicare Other | Admitting: Vascular Surgery

## 2016-08-18 ENCOUNTER — Encounter (INDEPENDENT_AMBULATORY_CARE_PROVIDER_SITE_OTHER): Payer: Self-pay | Admitting: Vascular Surgery

## 2016-08-18 VITALS — BP 138/80 | HR 87 | Resp 16 | Wt 198.0 lb

## 2016-08-18 DIAGNOSIS — E118 Type 2 diabetes mellitus with unspecified complications: Secondary | ICD-10-CM | POA: Diagnosis not present

## 2016-08-18 DIAGNOSIS — I890 Lymphedema, not elsewhere classified: Secondary | ICD-10-CM

## 2016-08-18 NOTE — Progress Notes (Signed)
Subjective:    Patient ID: Debra Butler, female    DOB: 15-Oct-1921, 83 y.o.   MRN: [id number] Chief Complaint  Patient presents with  . Follow-up   Patient presents for a unna boot / wound check. Has been treated with unna wraps for a RLE extremity lymphedema exacerbation / ulcer development. She is without complaint today. States improvement in her edema and the ulcer healing. The patient has compression stocking with her today and engages in elevation.    Review of Systems  Constitutional: Negative.   HENT: Negative.   Eyes: Negative.   Respiratory: Negative.   Cardiovascular: Negative.   Gastrointestinal: Negative.   Endocrine: Negative.   Genitourinary: Negative.   Musculoskeletal: Negative.   Skin: Negative.   Allergic/Immunologic: Negative.   Neurological: Negative.   Hematological: Negative.   Psychiatric/Behavioral: Negative.       Objective:   Physical Exam  Constitutional: She is oriented to person, place, and time. She appears well-developed and well-nourished.  HENT:  Head: Normocephalic and atraumatic.  Right Ear: External ear normal.  Left Ear: External ear normal.  Eyes: Conjunctivae and EOM are normal. Pupils are equal, round, and reactive to light.  Neck: Normal range of motion.  Cardiovascular: Normal rate, regular rhythm and normal heart sounds.   Pulmonary/Chest: Effort normal.  Musculoskeletal: Normal range of motion. She exhibits no edema.  Neurological: She is alert and oriented to person, place, and time.  Skin: Skin is warm and dry.  Right Lower Extremity: Ulceration has healed.   Psychiatric: She has a normal mood and affect. Her behavior is normal. Judgment and thought content normal.   BP 138/80   Pulse 87   Resp 16   Wt 198 lb (89.8 kg)   BMI 33.99 kg/m   Past Medical History:  Diagnosis Date  . Blood clot in vein 2013   left leg  . Diabetes mellitus without complication ([place]) 2585  . Hypertension 1988  . IBS (irritable bowel  syndrome)   . Myocardial infarction    Social History   Social History  . Marital status: Married    Spouse name: N/A  . Number of children: N/A  . Years of education: N/A   Occupational History  . Not on file.   Social History Main Topics  . Smoking status: Never Smoker  . Smokeless tobacco: Never Used  . Alcohol use No  . Drug use: No  . Sexual activity: Not on file   Other Topics Concern  . Not on file   Social History Narrative  . No narrative on file   Past Surgical History:  Procedure Laterality Date  . ABDOMINAL HYSTERECTOMY  age 73   partial, still has ovaries and tubes  . APPENDECTOMY    . BACK SURGERY    . BREAST BIOPSY     at age 32, benign  . CARPAL TUNNEL RELEASE Right 2010  . CHOLECYSTECTOMY    . COLON SURGERY  1984  . COLONOSCOPY  2007, 2016   Dr Jamal Collin  . FOOT SURGERY    . FRACTURE SURGERY  2010   hip  . FRACTURE SURGERY  2009   femur  . JOINT REPLACEMENT     x 2  . PACEMAKER PLACEMENT  2013  . TONSILLECTOMY    . UPPER GI ENDOSCOPY  2016   Dr Jamal Collin   No family history on file.  Allergies  Allergen Reactions  . Phenergan [Promethazine] Other (See Comments)    Altered mental status  .  Sulphur [Sulfur]       Assessment & Plan:  Patient presents for a unna boot / wound check. Has been treated with unna wraps for a RLE extremity lymphedema exacerbation / ulcer development. She is without complaint today. States improvement in her edema and the ulcer healing. The patient has compression stocking with her today and engages in elevation.   1. Lymphedema - Improvement Improvement in symptoms. No need to continue unna wraps. Continue compression and elevation. Follow up PRN  2. Type 2 diabetes mellitus with complication, without long-term current use of insulin (HCC) - Stable Encouraged good control as its slows the progression of atherosclerotic disease.  Current Outpatient Prescriptions on File Prior to Visit  Medication Sig Dispense  Refill  . acetaminophen (TYLENOL) 325 MG tablet Take 650 mg by mouth every 6 (six) hours as needed.    Marland Kitchen aspirin 81 MG tablet Take 81 mg by mouth daily.    Marland Kitchen diltiazem (DILACOR XR) 180 MG 24 hr capsule Take 180 mg by mouth daily.    . furosemide (LASIX) 20 MG tablet Take 20 mg by mouth daily.    . hydrocortisone-pramoxine (ANALPRAM HC) 2.5-1 % rectal cream Place 1 application rectally 3 (three) times daily. 30 g 2  . levothyroxine (SYNTHROID, LEVOTHROID) 112 MCG tablet Take 112 mcg by mouth daily before breakfast.    . metFORMIN (GLUCOPHAGE) 500 MG tablet Take 1,000 mg by mouth 2 (two) times daily with a meal.     . nitroGLYCERIN 2.5 MG CR capsule Take 2.5 mg by mouth as needed.    . ondansetron (ZOFRAN) 8 MG tablet Take by mouth every 8 (eight) hours as needed for nausea or vomiting.    . potassium chloride (KLOR-CON) 20 MEQ packet Take 20 mEq by mouth 2 (two) times daily.    . pregabalin (LYRICA) 100 MG capsule Take 100 mg by mouth 2 (two) times daily.    . sitaGLIPtin (JANUVIA) 100 MG tablet Take 100 mg by mouth daily.    . solifenacin (VESICARE) 5 MG tablet Take 5 mg by mouth daily.    . traMADol (ULTRAM) 50 MG tablet Take 1 tablet (50 mg total) by mouth every 6 (six) hours as needed. 15 tablet 0  . warfarin (COUMADIN) 6 MG tablet Take 6 mg by mouth one time only at 6 PM.     No current facility-administered medications on file prior to visit.     There are no Patient Instructions on file for this visit. No Follow-up on file.   Maitri Schnoebelen A Lashe Oliveira, PA-C

## 2016-09-06 ENCOUNTER — Observation Stay
Admission: EM | Admit: 2016-09-06 | Discharge: 2016-09-08 | Disposition: A | Discharge status: Home / Self Care | Payer: Medicare Other | Attending: Internal Medicine | Admitting: Internal Medicine

## 2016-09-06 ENCOUNTER — Encounter: Payer: Self-pay | Admitting: Emergency Medicine

## 2016-09-06 ENCOUNTER — Emergency Department: Payer: Medicare Other

## 2016-09-06 DIAGNOSIS — I2510 Atherosclerotic heart disease of native coronary artery without angina pectoris: Secondary | ICD-10-CM | POA: Insufficient documentation

## 2016-09-06 DIAGNOSIS — E860 Dehydration: Secondary | ICD-10-CM | POA: Diagnosis not present

## 2016-09-06 DIAGNOSIS — Z7901 Long term (current) use of anticoagulants: Secondary | ICD-10-CM | POA: Insufficient documentation

## 2016-09-06 DIAGNOSIS — R079 Chest pain, unspecified: Secondary | ICD-10-CM | POA: Diagnosis present

## 2016-09-06 DIAGNOSIS — G8929 Other chronic pain: Secondary | ICD-10-CM | POA: Diagnosis not present

## 2016-09-06 DIAGNOSIS — Z7982 Long term (current) use of aspirin: Secondary | ICD-10-CM | POA: Insufficient documentation

## 2016-09-06 DIAGNOSIS — M549 Dorsalgia, unspecified: Secondary | ICD-10-CM | POA: Diagnosis present

## 2016-09-06 DIAGNOSIS — I10 Essential (primary) hypertension: Secondary | ICD-10-CM | POA: Diagnosis not present

## 2016-09-06 DIAGNOSIS — Z79899 Other long term (current) drug therapy: Secondary | ICD-10-CM | POA: Insufficient documentation

## 2016-09-06 DIAGNOSIS — X58XXXA Exposure to other specified factors, initial encounter: Secondary | ICD-10-CM | POA: Diagnosis not present

## 2016-09-06 DIAGNOSIS — S39012A Strain of muscle, fascia and tendon of lower back, initial encounter: Principal | ICD-10-CM | POA: Insufficient documentation

## 2016-09-06 DIAGNOSIS — E119 Type 2 diabetes mellitus without complications: Secondary | ICD-10-CM | POA: Insufficient documentation

## 2016-09-06 DIAGNOSIS — I252 Old myocardial infarction: Secondary | ICD-10-CM | POA: Insufficient documentation

## 2016-09-06 DIAGNOSIS — I482 Chronic atrial fibrillation: Secondary | ICD-10-CM | POA: Insufficient documentation

## 2016-09-06 DIAGNOSIS — Z950 Presence of cardiac pacemaker: Secondary | ICD-10-CM | POA: Diagnosis not present

## 2016-09-06 DIAGNOSIS — E039 Hypothyroidism, unspecified: Secondary | ICD-10-CM | POA: Diagnosis not present

## 2016-09-06 DIAGNOSIS — Z86718 Personal history of other venous thrombosis and embolism: Secondary | ICD-10-CM | POA: Insufficient documentation

## 2016-09-06 DIAGNOSIS — Z7984 Long term (current) use of oral hypoglycemic drugs: Secondary | ICD-10-CM | POA: Insufficient documentation

## 2016-09-06 DIAGNOSIS — R55 Syncope and collapse: Secondary | ICD-10-CM | POA: Insufficient documentation

## 2016-09-06 DIAGNOSIS — Z9660 Presence of unspecified orthopedic joint implant: Secondary | ICD-10-CM | POA: Insufficient documentation

## 2016-09-06 LAB — CBC WITH DIFFERENTIAL/PLATELET
Basophils Absolute: 0.1 10*3/uL (ref 0–0.1)
Basophils Relative: 1 %
Eosinophils Absolute: 0.1 10*3/uL (ref 0–0.7)
Eosinophils Relative: 2 %
HCT: 38.5 % (ref 35.0–47.0)
HEMOGLOBIN: 12.6 g/dL (ref 12.0–16.0)
LYMPHS ABS: 0.6 10*3/uL — AB (ref 1.0–3.6)
LYMPHS PCT: 11 %
MCH: 31 pg (ref 26.0–34.0)
MCHC: 32.7 g/dL (ref 32.0–36.0)
MCV: 94.8 fL (ref 80.0–100.0)
Monocytes Absolute: 0.5 10*3/uL (ref 0.2–0.9)
Monocytes Relative: 9 %
NEUTROS PCT: 77 %
Neutro Abs: 4.5 10*3/uL (ref 1.4–6.5)
Platelets: 108 10*3/uL — ABNORMAL LOW (ref 150–440)
RBC: 4.06 MIL/uL (ref 3.80–5.20)
RDW: 18.4 % — ABNORMAL HIGH (ref 11.5–14.5)
WBC: 5.8 10*3/uL (ref 3.6–11.0)

## 2016-09-06 LAB — COMPREHENSIVE METABOLIC PANEL
ALK PHOS: 53 U/L (ref 38–126)
ALT: 14 U/L (ref 14–54)
AST: 22 U/L (ref 15–41)
Albumin: 4.2 g/dL (ref 3.5–5.0)
Anion gap: 10 (ref 5–15)
BUN: 23 mg/dL — ABNORMAL HIGH (ref 6–20)
CALCIUM: 9.7 mg/dL (ref 8.9–10.3)
CO2: 33 mmol/L — AB (ref 22–32)
CREATININE: 0.97 mg/dL (ref 0.44–1.00)
Chloride: 98 mmol/L — ABNORMAL LOW (ref 101–111)
GFR, EST AFRICAN AMERICAN: 57 mL/min — AB (ref >60)
GFR, EST NON AFRICAN AMERICAN: 49 mL/min — AB (ref >60)
Glucose, Bld: 110 mg/dL — ABNORMAL HIGH (ref 65–99)
Potassium: 4.3 mmol/L (ref 3.5–5.1)
Sodium: 141 mmol/L (ref 135–145)
Total Bilirubin: 0.7 mg/dL (ref 0.3–1.2)
Total Protein: 7 g/dL (ref 6.5–8.1)

## 2016-09-06 LAB — PROTIME-INR
INR: 1.38
PROTHROMBIN TIME: 17.1 s — AB (ref 11.4–15.2)

## 2016-09-06 LAB — TROPONIN I: Troponin I: <0.03 ng/mL (ref <0.03)

## 2016-09-06 LAB — GLUCOSE, CAPILLARY: GLUCOSE-CAPILLARY: 160 mg/dL — AB (ref 65–99)

## 2016-09-06 MED ORDER — WARFARIN - PHARMACIST DOSING INPATIENT
Freq: Every day | Status: DC
  Administered 2016-09-07: 18:00:00

## 2016-09-06 MED ORDER — PREGABALIN 50 MG PO CAPS
100.0000 mg | ORAL_CAPSULE | Freq: Two times a day (BID) | ORAL | Status: DC
  Administered 2016-09-06 – 2016-09-08 (×4): 100 mg via ORAL
  Filled 2016-09-06 (×4): qty 2

## 2016-09-06 MED ORDER — INSULIN ASPART 100 UNIT/ML ~~LOC~~ SOLN
0.0000 [IU] | Freq: Three times a day (TID) | SUBCUTANEOUS | Status: DC
  Filled 2016-09-06: qty 2

## 2016-09-06 MED ORDER — NITROGLYCERIN 0.4 MG SL SUBL: 0.4000 mg | SUBLINGUAL_TABLET | SUBLINGUAL | Status: DC | PRN

## 2016-09-06 MED ORDER — WARFARIN SODIUM 6 MG PO TABS
9.0000 mg | ORAL_TABLET | Freq: Once | ORAL | Status: AC
  Administered 2016-09-06: 9 mg via ORAL
  Filled 2016-09-06: qty 1

## 2016-09-06 MED ORDER — ACETAMINOPHEN 325 MG PO TABS
650.0000 mg | ORAL_TABLET | ORAL | Status: DC | PRN
  Administered 2016-09-08: 650 mg via ORAL
  Filled 2016-09-06 (×3): qty 2

## 2016-09-06 MED ORDER — TRAMADOL HCL 50 MG PO TABS: 50.0000 mg | ORAL_TABLET | Freq: Four times a day (QID) | ORAL | 0 refills | Status: DC | PRN

## 2016-09-06 MED ORDER — INSULIN ASPART 100 UNIT/ML ~~LOC~~ SOLN: 0.0000 [IU] | Freq: Every day | SUBCUTANEOUS | Status: DC

## 2016-09-06 MED ORDER — ACETAMINOPHEN 325 MG PO TABS
650.0000 mg | ORAL_TABLET | Freq: Once | ORAL | Status: AC
  Administered 2016-09-06: 650 mg via ORAL
  Filled 2016-09-06: qty 2

## 2016-09-06 MED ORDER — TRAMADOL HCL 50 MG PO TABS
50.0000 mg | ORAL_TABLET | Freq: Once | ORAL | Status: AC
  Administered 2016-09-06: 50 mg via ORAL
  Filled 2016-09-06: qty 1

## 2016-09-06 MED ORDER — POTASSIUM CHLORIDE 20 MEQ PO PACK
20.0000 meq | PACK | Freq: Two times a day (BID) | ORAL | Status: DC
  Administered 2016-09-06 – 2016-09-08 (×4): 20 meq via ORAL
  Filled 2016-09-06 (×4): qty 1

## 2016-09-06 MED ORDER — ACETAMINOPHEN 325 MG PO TABS
650.0000 mg | ORAL_TABLET | Freq: Four times a day (QID) | ORAL | Status: DC | PRN
  Administered 2016-09-07 – 2016-09-08 (×2): 650 mg via ORAL

## 2016-09-06 MED ORDER — ASPIRIN 81 MG PO CHEW: 324.0000 mg | CHEWABLE_TABLET | Freq: Once | ORAL | Status: DC

## 2016-09-06 MED ORDER — ASPIRIN EC 81 MG PO TBEC
81.0000 mg | DELAYED_RELEASE_TABLET | Freq: Every day | ORAL | Status: DC
  Administered 2016-09-07 – 2016-09-08 (×2): 81 mg via ORAL
  Filled 2016-09-06 (×2): qty 1

## 2016-09-06 MED ORDER — LEVOTHYROXINE SODIUM 112 MCG PO TABS
112.0000 ug | ORAL_TABLET | Freq: Every day | ORAL | Status: DC
  Administered 2016-09-07 – 2016-09-08 (×2): 112 ug via ORAL
  Filled 2016-09-06 (×2): qty 1

## 2016-09-06 MED ORDER — ENOXAPARIN SODIUM 40 MG/0.4ML ~~LOC~~ SOLN
40.0000 mg | SUBCUTANEOUS | Status: DC
  Administered 2016-09-06 – 2016-09-07 (×2): 40 mg via SUBCUTANEOUS
  Filled 2016-09-06 (×2): qty 0.4

## 2016-09-06 MED ORDER — WARFARIN SODIUM 6 MG PO TABS: 9.0000 mg | ORAL_TABLET | Freq: Once | ORAL | Status: DC

## 2016-09-06 MED ORDER — FUROSEMIDE 40 MG PO TABS
80.0000 mg | ORAL_TABLET | Freq: Two times a day (BID) | ORAL | Status: DC
  Administered 2016-09-06 – 2016-09-08 (×4): 80 mg via ORAL
  Filled 2016-09-06 (×4): qty 2

## 2016-09-06 MED ORDER — ONDANSETRON HCL 4 MG/2ML IJ SOLN: 4.0000 mg | Freq: Four times a day (QID) | INTRAMUSCULAR | Status: DC | PRN

## 2016-09-06 MED ORDER — DILTIAZEM HCL ER COATED BEADS 180 MG PO CP24
180.0000 mg | ORAL_CAPSULE | Freq: Every day | ORAL | Status: DC
  Administered 2016-09-07 – 2016-09-08 (×2): 180 mg via ORAL
  Filled 2016-09-06 (×3): qty 1

## 2016-09-06 MED ORDER — SODIUM CHLORIDE 0.9 % IV SOLN
INTRAVENOUS | Status: AC
  Administered 2016-09-06: 20:00:00 via INTRAVENOUS

## 2016-09-06 NOTE — Evaluation (Signed)
Physical Therapy Evaluation Patient Details Name: Debra Butler MRN: [id number] DOB: 24-Oct-1921 Today's Date: 09/06/2016   History of Present Illness  presented to ER with onset of back pain after bending down, heard a "pop"; imaging negative for acute injury.  Clinical Impression  Patient generally tense and guarded with all functional activities, especially throughout R LE (due to pain). Currently requiring min/mod assist for rolling, mod assist for bed mobility (educated in log rolling).  Immediately upon transition to upright, patient with reports of nausea, dizziness; noted significant decrease in responsiveness.  Unable to tolerate sitting for duration to allow full BP assessment.  Required total assist for return to supine. Patient maintained eye open, but with noted delay in response time and ability to communicate with therapist.  RN/MD immediately informed and in room for assessment.  After 2-3 minutes, alertness improved and patient progressively returning to baseline level of consciousness. Additional mobility assessment deferred as a result.  Will continue full assessment as patient medically appropriate. Would benefit from skilled PT to address above deficits and promote optimal return to PLOF; recommend transition to STR upon discharge from acute hospitalization.     Follow Up Recommendations SNF    Equipment Recommendations       Recommendations for Other Services       Precautions / Restrictions Precautions Precautions: Fall Restrictions Weight Bearing Restrictions: No      Mobility  Bed Mobility Overal bed mobility: Needs Assistance Bed Mobility: Supine to Sit;Sit to Supine     Supine to sit: Mod assist Sit to supine: Total assist   General bed mobility comments: educated in log rolling technique for comfort/back protection; assist for truncal elevation  Transfers                 General transfer comment: unsafe/unable to tolerate due to near  syncopal episode  Ambulation/Gait             General Gait Details: unsafe/unable to tolerate due to near syncopal episode  Stairs            Wheelchair Mobility    Modified Rankin (Stroke Patients Only)       Balance Overall balance assessment: Needs assistance Sitting-balance support: Bilateral upper extremity supported Sitting balance-Leahy Scale: Fair                                       Pertinent Vitals/Pain Pain Assessment: Faces Faces Pain Scale: Hurts even more Pain Location: back Pain Descriptors / Indicators: Grimacing Pain Intervention(s): Limited activity within patient's tolerance;Repositioned;Monitored during session    Home Living Family/patient expects to be discharged to:: Private residence Living Arrangements: Alone Available Help at Discharge: Family Type of Home: House Home Access: Stairs to enter Entrance Stairs-Rails: None Entrance Stairs-Number of Steps: 1 Home Layout: One level Home Equipment: Environmental consultant - 2 wheels;Shower seat;Grab bars - tub/shower      Prior Function Level of Independence: Independent with assistive device(s)         Comments: Mod indep for ADLs and household activities; reports "sudden sit down" approx one week prior to this admission.  Has CNA 2 days/week that assists with household activities as needed.     Hand Dominance        Extremity/Trunk Assessment   Upper Extremity Assessment Upper Extremity Assessment: Overall WFL for tasks assessed    Lower Extremity Assessment Lower Extremity Assessment:  (R LE generally  guarded, strength at least 3-/5 due to pain; L LE grossly WFL and more fluid in movement)       Communication   Communication: No difficulties  Cognition Arousal/Alertness: Awake/alert Behavior During Therapy: WFL for tasks assessed/performed Overall Cognitive Status: Within Functional Limits for tasks assessed                                         General Comments      Exercises Other Exercises Other Exercises: Rolling bilat, min/mod assist for management of bedpan and associated bladder incontinence; dep for hygiene, linen and clothing change.   Assessment/Plan    PT Assessment    PT Problem List Decreased strength;Decreased range of motion;Decreased activity tolerance;Decreased balance;Decreased mobility;Decreased coordination;Decreased cognition;Decreased knowledge of use of DME;Decreased safety awareness;Decreased knowledge of precautions;Pain       PT Treatment Interventions DME instruction;Gait training;Stair training;Functional mobility training;Therapeutic activities;Therapeutic exercise;Balance training;Patient/family education    PT Goals (Current goals can be found in the Care Plan section)  Acute Rehab PT Goals Patient Stated Goal: to go to rehab PT Goal Formulation: With patient Time For Goal Achievement: 09/20/16 Potential to Achieve Goals: Good Additional Goals Additional Goal #1: Assess and establish goals for OOB activity as appropriate.    Frequency Min 2X/week   Barriers to discharge Decreased caregiver support;Inaccessible home environment      Co-evaluation               End of Session   Activity Tolerance: Treatment limited secondary to medical complications (Comment) (near syncopal event with transition to upright) Patient left: in bed;with nursing/sitter in room Nurse Communication: Mobility status (tolerance to upright) PT Visit Diagnosis: Muscle weakness (generalized) (M62.81);Pain Pain - Right/Left: Right Pain - part of body: Leg    Time: 1525-1601 PT Time Calculation (min) (ACUTE ONLY): 36 min   Charges:   PT Evaluation $PT Eval Moderate Complexity: 1 Procedure PT Treatments $Therapeutic Activity: 8-22 mins   PT G Codes:   PT G-Codes **NOT FOR INPATIENT CLASS** Functional Assessment Tool Used: Clinical judgement;AM-PAC 6 Clicks Basic Mobility Functional Limitation:  Mobility: Walking and moving around Mobility: Walking and Moving Around Current Status (I7124): At least 80 percent but less than 100 percent impaired, limited or restricted Mobility: Walking and Moving Around Goal Status [phone]): At least 20 percent but less than 40 percent impaired, limited or restricted    Clare Fennimore H. Owens Shark, PT, DPT, NCS 09/06/16, 10:16 PM [phone]

## 2016-09-06 NOTE — Progress Notes (Signed)
Advanced Care Plan.   Purpose of Encounter:CODE STATUS.  Parties in Attendance: The patient, her POA and me.  Patient's Decisional Capacity: Yes. Medical Story: The patient is being admitted for chest pain and near syncope. She has a history of hypertension, diabetes, chronic A. fib and chronic back pain. I discussed CODE STATUS with the patient and her POA. The patient does want CPR but she does not want intubation, ventilation, shock, or administer ACLS medications.  Plan:  Code Status: Limited code.  Time spent discussing advance care planning: 17 minutes.

## 2016-09-06 NOTE — ED Provider Notes (Signed)
[facility] Provider Note   CSN: [id number] Arrival date & time: 09/06/16  1203     History   Chief Complaint Chief Complaint  Patient presents with  . Back Pain    HPI Polly CYNTHA BRICKMAN is a 83 y.o. female history of diabetes, MI, here presenting with back pain. Patient states that she has chronic back pain and had previous spinal surgeries. Onto her back about 2 months ago and has intermittent pain. This morning, she bent over and heard a pop in her back and was concerned that she broke something but didn't fall onto her back. Denies leg pain or numbness or weakness. Denies trouble urinating. No meds prior to arrival.   The history is provided by the patient.    Past Medical History:  Diagnosis Date  . Blood clot in vein 2013   left leg  . Diabetes mellitus without complication ([place]) 5643  . Hypertension 1988  . IBS (irritable bowel syndrome)   . Myocardial infarction     Patient Active Problem List   Diagnosis Date Noted  . Lower extremity edema 07/31/2016  . Diabetes ([place]) 04/03/2016  . Lymphedema 04/03/2016  . Swelling of limb 04/03/2016    Past Surgical History:  Procedure Laterality Date  . ABDOMINAL HYSTERECTOMY  age 58   partial, still has ovaries and tubes  . APPENDECTOMY    . BACK SURGERY    . BREAST BIOPSY     at age 34, benign  . CARPAL TUNNEL RELEASE Right 2010  . CHOLECYSTECTOMY    . COLON SURGERY  1984  . COLONOSCOPY  2007, 2016   Dr Jamal Collin  . FOOT SURGERY    . FRACTURE SURGERY  2010   hip  . FRACTURE SURGERY  2009   femur  . JOINT REPLACEMENT     x 2  . PACEMAKER PLACEMENT  2013  . TONSILLECTOMY    . UPPER GI ENDOSCOPY  2016   Dr Jamal Collin    OB History    Durenda Age Para Term Preterm AB Living   4 3     1 2    SAB TAB Ectopic Multiple Live Births   1              Obstetric Comments   1st Menstrual Cycle:  13 1st Pregnancy:  26       Home Medications    Prior to Admission medications   Medication Sig Start Date  End Date Taking? Authorizing Provider  acetaminophen (TYLENOL) 325 MG tablet Take 650 mg by mouth every 6 (six) hours as needed.    Historical Provider, MD  aspirin 81 MG tablet Take 81 mg by mouth daily.    Historical Provider, MD  diltiazem (DILACOR XR) 180 MG 24 hr capsule Take 180 mg by mouth daily.    Historical Provider, MD  furosemide (LASIX) 20 MG tablet Take 20 mg by mouth daily.    Historical Provider, MD  hydrocortisone-pramoxine (ANALPRAM HC) 2.5-1 % rectal cream Place 1 application rectally 3 (three) times daily. 09/08/14   Seeplaputhur Robinette Haines, MD  levothyroxine (SYNTHROID, LEVOTHROID) 112 MCG tablet Take 112 mcg by mouth daily before breakfast.    Historical Provider, MD  metFORMIN (GLUCOPHAGE) 500 MG tablet Take 1,000 mg by mouth 2 (two) times daily with a meal.     Historical Provider, MD  nitroGLYCERIN 2.5 MG CR capsule Take 2.5 mg by mouth as needed.    Historical Provider, MD  ondansetron (ZOFRAN) 8 MG tablet Take by  mouth every 8 (eight) hours as needed for nausea or vomiting.    Historical Provider, MD  potassium chloride (KLOR-CON) 20 MEQ packet Take 20 mEq by mouth 2 (two) times daily.    Historical Provider, MD  pregabalin (LYRICA) 100 MG capsule Take 100 mg by mouth 2 (two) times daily.    Historical Provider, MD  sitaGLIPtin (JANUVIA) 100 MG tablet Take 100 mg by mouth daily.    Historical Provider, MD  solifenacin (VESICARE) 5 MG tablet Take 5 mg by mouth daily.    Historical Provider, MD  traMADol (ULTRAM) 50 MG tablet Take 1 tablet (50 mg total) by mouth every 6 (six) hours as needed. 06/04/16 06/04/17  Nance Pear, MD  traMADol (ULTRAM) 50 MG tablet Take 1 tablet (50 mg total) by mouth every 6 (six) hours as needed. 09/06/16 09/06/17  Drenda Freeze, MD  warfarin (COUMADIN) 6 MG tablet Take 6 mg by mouth one time only at 6 PM.    Historical Provider, MD    Family History History reviewed. No pertinent family history.  Social History Social History  Substance  Use Topics  . Smoking status: Never Smoker  . Smokeless tobacco: Never Used  . Alcohol use No     Allergies   Phenergan [promethazine] and Sulphur [sulfur]   Review of Systems Review of Systems  Musculoskeletal: Positive for back pain.  All other systems reviewed and are negative.    Physical Exam Updated Vital Signs BP 110/68 (BP Location: Right Arm)   Pulse (!) 101   Temp 98.4 F (36.9 C) (Oral)   Resp 18   Ht 5' (1.524 m)   Wt 195 lb (88.5 kg)   SpO2 100%   BMI 38.08 kg/m   Physical Exam  Constitutional: She is oriented to person, place, and time.  Chronically ill, well appearing for age   HENT:  Head: Normocephalic and atraumatic.  Eyes: Pupils are equal, round, and reactive to light.  Neck: Normal range of motion.  Cardiovascular: Normal rate and regular rhythm.   Pulmonary/Chest: Effort normal and breath sounds normal. No respiratory distress. She has no wheezes.  Abdominal: Soft. Bowel sounds are normal. She exhibits no distension. There is no tenderness. There is no guarding.  Musculoskeletal:  Previous laminectomy scar. Mild lower lumbar tenderness, no obvious deformity. Nl ROM bilateral hips, neg straight leg raise   Neurological: She is alert and oriented to person, place, and time.  No obvious saddle anesthesia, nl strength bilateral lower extremities. Nl reflexes   Skin: Skin is warm.  Psychiatric: She has a normal mood and affect.  Nursing note and vitals reviewed.    ED Treatments / Results  Labs (all labs ordered are listed, but only abnormal results are displayed) Labs Reviewed  CBC WITH DIFFERENTIAL/PLATELET - Abnormal; Notable for the following:       Result Value   RDW 18.4 (*)    Platelets 108 (*)    Lymphs Abs 0.6 (*)    All other components within normal limits  COMPREHENSIVE METABOLIC PANEL - Abnormal; Notable for the following:    Chloride 98 (*)    CO2 33 (*)    Glucose, Bld 110 (*)    BUN 23 (*)    GFR calc non Af Amer 49  (*)    GFR calc Af Amer 57 (*)    All other components within normal limits  CBG MONITORING, ED    EKG  EKG Interpretation None  Radiology Ct Lumbar Spine Wo Contrast  Result Date: 09/06/2016 CLINICAL DATA:  Sudden onset back pain.  History of lumbar fusion EXAM: CT LUMBAR SPINE WITHOUT CONTRAST TECHNIQUE: Multidetector CT imaging of the lumbar spine was performed without intravenous contrast administration. Multiplanar CT image reconstructions were also generated. COMPARISON:  Lumbar radiographs 11/04/2009 FINDINGS: Segmentation: Normal Alignment: Normal Vertebrae: Mild fracture superior endplate of L4 appears chronic with associated spurring and endplate irregularity. No acute fracture or mass. Paraspinal and other soft tissues: Atherosclerotic aorta. 6 cm left renal cyst. 15 mm mildly hyperdense left renal cysts. These are unchanged in size from CT of 10/30/2009. Disc levels: Pedicle screw fusion L2 through L5. Hardware in satisfactory position. No hardware loosening or failure. T11-12:  Mild degenerative change T12-L1: Advanced disc degeneration with endplate sclerosis and vacuum disc phenomena. Bilateral facet hypertrophy. No significant stenosis. L1-2:  Mild disc degeneration.  Moderate facet degeneration. L2-3:  Pedicle screw   fusion.  No significant stenosis. L3-4: Pedicle screw fusion. Posterior spurring of the superior endplate of L4. Posterior decompression. No significant spinal or foraminal encroachment. L4-5:  Pedicle screw   fusion without stenosis L5-S1: Moderate facet hypertrophy causing subarticular stenosis bilaterally. Posterior decompression. IMPRESSION: Pedicle screw fusion L2 through L5 without complication. Chronic appearing fracture superior endplate of L4. No acute fracture Advanced degenerative change and spurring at T12-L1 without significant spinal stenosis. Electronically Signed   By: Franchot Gallo M.D.   On: 09/06/2016 12:49    Procedures Procedures  (including critical care time)  Medications Ordered in ED Medications  acetaminophen (TYLENOL) tablet 650 mg (650 mg Oral Given 09/06/16 1308)  traMADol (ULTRAM) tablet 50 mg (50 mg Oral Given 09/06/16 1402)     Initial Impression / Assessment and Plan / ED Course  I have reviewed the triage vital signs and the nursing notes.  Pertinent labs & imaging results that were available during my care of the patient were reviewed by me and considered in my medical decision making (see chart for details).     Iraida J Fenley is a 83 y.o. female here with back pain. Had previous laminectomy in the past and injury 2 months ago and sudden onset pain today. Neurovascular intact. Likely disc protrusion. Will get CT lumbar spine. No need for MRI currently.   3:43 PM CT showed no fractures. Unable to ambulate still and she lives alone. Will get PT and social work consult. Will give tramadol for pain, patient doesn't want narcotics.   3:43 PM CT showed no fractures. Labs at baseline. Social work talked to patient. Doesn't meet admission criteria currently and she doesn't want to pay out of pocket for rehab. Physical therapy at bedside. Dr. Alfred Levins aware. Ordered home health, physical therapy and OT and aide for home.    Final Clinical Impressions(s) / ED Diagnoses   Final diagnoses:  Strain of lumbar region, initial encounter    New Prescriptions New Prescriptions   TRAMADOL (ULTRAM) 50 MG TABLET    Take 1 tablet (50 mg total) by mouth every 6 (six) hours as needed.     Drenda Freeze, MD 09/06/16 [phone]

## 2016-09-06 NOTE — ED Provider Notes (Signed)
-----------------------------------------   4:26 PM on 09/06/2016 -----------------------------------------   Blood pressure 110/68, pulse (!) 101, temperature 98.4 F (36.9 C), temperature source Oral, resp. rate 18, height 5' (1.524 m), weight 195 lb (88.5 kg), SpO2 100 %.  Assuming care from Dr. Darl Householder of [name] is a 83 y.o. female with a chief complaint of Back Pain .    In summary, [name] h/o DM, CAD, SSS s/p pacemaker, DVT on coumadin presenting for evaluation of back pain after bending over and hearing a pop on her back. Normal sensation and strength of b/l LE, no urinary retention.   CT: Pedicle screw fusion L2 through L5 without complication. Chronic appearing fracture superior endplate of L4. No acute fracture. Advanced degenerative change and spurring at T12-L1 without significant spinal stenosis.  Patient was being evaluated by PT and patient is unable to ambulate. Patient then had a syncopal episode. She was not connected to telemetry. EKG showing afib with rate 80 and no ischemia. Patient started to complain of CP. Troponin sent. ASA given. Pacer interrogated with no arrhythmias. CP resolved after a few minutes. Plan to admit.  ED ECG REPORT I, Rudene Re, the attending physician, personally viewed and interpreted this ECG.  Atrial fibrillation, rate of 80, normal QRS and QTc intervals, normal axis, no ST elevations or depressions, diffuse T-wave flattening.   _________________________ 5:28 PM on 09/06/2016 -----------------------------------------  Troponin negative. CP free. Admit to Hospitalist for further management   Rudene Re, MD 09/06/16 1728

## 2016-09-06 NOTE — Care Management (Signed)
Patient presents to ED with c/o feeling a "pop" in her back while bending over.  RNCM met with patient to set up prior to discharge.  While patient was been assessed by PT, patient has syncopal episode.  Per MD patient to be admitted.  Patient lives at home alone.  Patient A&O, has a walker and WC in the home for ambulation.  PCP [name].  Pharmacy [facility].  Patient states that she has had PT in the past, but does not recall the agency.  Patient states that should she require home health services at discharge she does not have an agency preference.  Patient does not have any family who are actually involved.  Patient has PCS services from a friend named Clyde Canterbury who is paid for out of pocket.  Patient states "she comes at least every other day, and will even stay over the night if I need her too".  Patient's friend Daiva Nakayama is also at the bedside.  Patient states that Daiva Nakayama is her POA.  Patient also has another friend named Webb Silversmith who is involved with making medical decisions. Patient's visitors inquire about discharging to [facility] when appropriate.  I have explained patient, and her visitors that I anticipate the patient to be an observation admission, and I also explained medicare requirements of 3 inpatient nights.  Everyone states that they understand.  I notified them that should the patient be admitted observation, she would receive a Medicare Observation Letter.  RNCM following.

## 2016-09-06 NOTE — [facility] Notes (Addendum)
ANTICOAGULATION CONSULT NOTE - Initial Consult  Pharmacy Consult for warfarin  Indication: atrial fibrillation  Allergies  Allergen Reactions  . Phenergan [Promethazine] Other (See Comments)    Altered mental status  . [place] [Sulfur]     Patient Measurements: Height: 5' (152.4 cm) Weight: 195 lb (88.5 kg) IBW/kg (Calculated) : 45.5   Vital Signs: Temp: 98.4 F (36.9 C) (04/11 1211) Temp Source: Oral (04/11 1211) BP: 110/68 (04/11 1211) Pulse Rate: 101 (04/11 1211)  Labs:  Recent Labs  09/06/16 1407 09/06/16 1616 09/06/16 1712  HGB 12.6  --   --   HCT 38.5  --   --   PLT 108*  --   --   LABPROT  --   --  17.1*  INR  --   --  1.38  CREATININE 0.97  --   --   TROPONINI  --  <0.03  --     Estimated Creatinine Clearance: 35.9 mL/min (by C-G formula based on SCr of 0.97 mg/dL).   Medical History: Past Medical History:  Diagnosis Date  . Blood clot in vein 2013   left leg  . Diabetes mellitus without complication ([place]) 1962  . Hypertension 1988  . IBS (irritable bowel syndrome)   . Myocardial infarction      Assessment: 83 yo female with PMH of A. DVT. Pharmacy consulted for warfarin dosing and monitoring.   Home Regimen: Warfarin 7.5 mg Daily   DATE  INR  DOSE 4/11  1.38  9mg    Goal of Therapy:  INR 2-3 Monitor platelets by anticoagulation protocol: Yes   Plan:  4/11 INR subtherapeutic on admission. Will give one time dose of warfarin 9mg  tonight. Will check INR daily until therapeutic.   Pernell Dupre, PharmD, BCPS Clinical Pharmacist 09/06/2016 6:05 PM

## 2016-09-06 NOTE — ED Triage Notes (Signed)
Pt via ems from home. She reports that she bent over and felt a "pop" in her lower back. She has hx of back surgery 13 years ago and thinks she "broke something." Pt alert & oriented with NAD noted.

## 2016-09-06 NOTE — Clinical Social Work Note (Addendum)
CSW consulted for New SNF. Pt has Medicare and has not had a 3 night inpatient qualifying stay. Pt is unable to pay privately. Pt lives alone and has a supportive POA. Pt also has an aide that stays with her overnight. PT is working with pt at time of note. CSW contacted RNCM to arrange home health services and DME. CSW provided resources for SNF, ALF, and Private Duty Care Agencies. MD is aware of dispo. CSW is signing off as no further needs identified.   Debra Butler, MSW, LCSW  Clinical Social Worker  [phone]

## 2016-09-06 NOTE — ED Notes (Signed)
Pt able to sit up but unable to come to a standing position, even with walker

## 2016-09-06 NOTE — Discharge Instructions (Signed)
Continue tylenol as needed for pain.   Take tramadol for severe pain.   Home health will follow up with you regarding physical therapy, occupational therapy, nurse, aide.   See your doctor  Return to ER if you have worsening back pain, unable to walk.

## 2016-09-06 NOTE — H&P (Signed)
[facility] [place] NAME: Debra Butler    MR#:  [id number]  DATE OF BIRTH:  1921/10/10  DATE OF ADMISSION:  09/06/2016  PRIMARY CARE PHYSICIAN: Lenard Simmer, MD   REQUESTING/REFERRING PHYSICIAN: Rudene Re, MD  CHIEF COMPLAINT:   Chief Complaint  Patient presents with  . Back Pain    HISTORY OF PRESENT ILLNESS:  Debra Butler  is a 83 y.o. female with a known history of Hypertension, diabetes, chronic A. Fib, DVT and CAD. The patient to present to the ED with back pain after bending over and hearing a pop in her back. The CAT scan of the spine didn't show any acute changes. Patient was evaluated by PT in the ED since she is unable to ambulate. During the PT evaluation, the patient developed the chest pain which is across the chest, lasted 15 minutes without radiation. She also complains of dizziness and near syncope during that time. She was given aspirin. The first troponin level is normal. ED physician requested admission.  PAST MEDICAL HISTORY:   Past Medical History:  Diagnosis Date  . Blood clot in vein 2013   left leg  . Diabetes mellitus without complication ([place]) 1941  . Hypertension 1988  . IBS (irritable bowel syndrome)   . Myocardial infarction     PAST SURGICAL HISTORY:   Past Surgical History:  Procedure Laterality Date  . ABDOMINAL HYSTERECTOMY  age 64   partial, still has ovaries and tubes  . APPENDECTOMY    . BACK SURGERY    . BREAST BIOPSY     at age 89, benign  . CARPAL TUNNEL RELEASE Right 2010  . CHOLECYSTECTOMY    . COLON SURGERY  1984  . COLONOSCOPY  2007, 2016   Dr Jamal Collin  . FOOT SURGERY    . FRACTURE SURGERY  2010   hip  . FRACTURE SURGERY  2009   femur  . JOINT REPLACEMENT     x 2  . PACEMAKER PLACEMENT  2013  . TONSILLECTOMY    . UPPER GI ENDOSCOPY  2016   Dr Jamal Collin    SOCIAL HISTORY:   Social History  Substance Use Topics  . Smoking status: Never Smoker  . Smokeless  tobacco: Never Used  . Alcohol use No    FAMILY HISTORY:  History reviewed. No pertinent family history.  DRUG ALLERGIES:   Allergies  Allergen Reactions  . Phenergan [Promethazine] Other (See Comments)    Altered mental status  . Sulphur [Sulfur]     REVIEW OF SYSTEMS:   Review of Systems  Constitutional: Negative for chills, fever and malaise/fatigue.  HENT: Negative for congestion and sore throat.   Eyes: Negative for blurred vision and double vision.  Respiratory: Negative for cough, shortness of breath and stridor.   Cardiovascular: Positive for chest pain. Negative for palpitations and leg swelling.  Gastrointestinal: Positive for nausea. Negative for abdominal pain, blood in stool, melena and vomiting.  Genitourinary: Negative for dysuria and hematuria.  Musculoskeletal: Positive for back pain.  Neurological: Positive for dizziness and weakness. Negative for focal weakness, seizures, loss of consciousness and headaches.  Psychiatric/Behavioral: Negative for depression. The patient is not nervous/anxious.     MEDICATIONS AT HOME:   Prior to Admission medications   Medication Sig Start Date End Date Taking? Authorizing Provider  acetaminophen (TYLENOL) 325 MG tablet Take 650 mg by mouth every 6 (six) hours as needed.   Yes Historical Provider, MD  aspirin 81  MG tablet Take 81 mg by mouth daily.   Yes Historical Provider, MD  diltiazem (DILACOR XR) 180 MG 24 hr capsule Take 180 mg by mouth daily.   Yes Historical Provider, MD  furosemide (LASIX) 20 MG tablet Take 80 mg by mouth 2 (two) times daily.    Yes Historical Provider, MD  levothyroxine (SYNTHROID, LEVOTHROID) 112 MCG tablet Take 112 mcg by mouth daily before breakfast.   Yes Historical Provider, MD  metFORMIN (GLUCOPHAGE) 500 MG tablet Take 1,000 mg by mouth 2 (two) times daily with a meal.    Yes Historical Provider, MD  nitroGLYCERIN 2.5 MG CR capsule Take 2.5 mg by mouth as needed.   Yes Historical Provider, MD   potassium chloride (KLOR-CON) 20 MEQ packet Take 20 mEq by mouth 2 (two) times daily.   Yes Historical Provider, MD  pregabalin (LYRICA) 100 MG capsule Take 100 mg by mouth 2 (two) times daily.   Yes Historical Provider, MD  traMADol (ULTRAM) 50 MG tablet Take 1 tablet (50 mg total) by mouth every 6 (six) hours as needed. 06/04/16 06/04/17 Yes Nance Pear, MD  warfarin (COUMADIN) 7.5 MG tablet Take 7.5 mg by mouth one time only at 6 PM.    Yes Historical Provider, MD  hydrocortisone-pramoxine (ANALPRAM HC) 2.5-1 % rectal cream Place 1 application rectally 3 (three) times daily. Patient not taking: Reported on 09/06/2016 09/08/14   Seeplaputhur Robinette Haines, MD  traMADol (ULTRAM) 50 MG tablet Take 1 tablet (50 mg total) by mouth every 6 (six) hours as needed. 09/06/16 09/06/17  Drenda Freeze, MD      VITAL SIGNS:  Blood pressure 110/68, pulse (!) 101, temperature 98.4 F (36.9 C), temperature source Oral, resp. rate 18, height 5' (1.524 m), weight 195 lb (88.5 kg), SpO2 100 %.  PHYSICAL EXAMINATION:  Physical Exam  GENERAL:  83 y.o.-year-old patient lying in the bed with no acute distress.  EYES: Pupils equal, round, reactive to light and accommodation. No scleral icterus. Extraocular muscles intact.  HEENT: Head atraumatic, normocephalic. Oropharynx and nasopharynx clear.  NECK:  Supple, no jugular venous distention. No thyroid enlargement, no tenderness.  LUNGS: Normal breath sounds bilaterally, no wheezing, rales,rhonchi or crepitation. No use of accessory muscles of respiration.  CARDIOVASCULAR: S1, S2 normal. No murmurs, rubs, or gallops.  ABDOMEN: Soft, nontender, nondistended. Bowel sounds present. No organomegaly or mass.  EXTREMITIES: No pedal edema, cyanosis, or clubbing.  NEUROLOGIC: Cranial nerves II through XII are intact. Muscle strength 4/5 in all extremities. Sensation intact. Gait not checked.  PSYCHIATRIC: The patient is alert and oriented x 3.  SKIN: No obvious rash,  lesion, or ulcer.   LABORATORY PANEL:   CBC  Recent Labs Lab 09/06/16 1407  WBC 5.8  HGB 12.6  HCT 38.5  PLT 108*   ------------------------------------------------------------------------------------------------------------------  Chemistries   Recent Labs Lab 09/06/16 1407  NA 141  K 4.3  CL 98*  CO2 33*  GLUCOSE 110*  BUN 23*  CREATININE 0.97  CALCIUM 9.7  AST 22  ALT 14  ALKPHOS 53  BILITOT 0.7   ------------------------------------------------------------------------------------------------------------------  Cardiac Enzymes  Recent Labs Lab 09/06/16 1616  TROPONINI <0.03   ------------------------------------------------------------------------------------------------------------------  RADIOLOGY:  Ct Lumbar Spine Wo Contrast  Result Date: 09/06/2016 CLINICAL DATA:  Sudden onset back pain.  History of lumbar fusion EXAM: CT LUMBAR SPINE WITHOUT CONTRAST TECHNIQUE: Multidetector CT imaging of the lumbar spine was performed without intravenous contrast administration. Multiplanar CT image reconstructions were also generated. COMPARISON:  Lumbar radiographs  11/04/2009 FINDINGS: Segmentation: Normal Alignment: Normal Vertebrae: Mild fracture superior endplate of L4 appears chronic with associated spurring and endplate irregularity. No acute fracture or mass. Paraspinal and other soft tissues: Atherosclerotic aorta. 6 cm left renal cyst. 15 mm mildly hyperdense left renal cysts. These are unchanged in size from CT of 10/30/2009. Disc levels: Pedicle screw fusion L2 through L5. Hardware in satisfactory position. No hardware loosening or failure. T11-12:  Mild degenerative change T12-L1: Advanced disc degeneration with endplate sclerosis and vacuum disc phenomena. Bilateral facet hypertrophy. No significant stenosis. L1-2:  Mild disc degeneration.  Moderate facet degeneration. L2-3:  Pedicle screw   fusion.  No significant stenosis. L3-4: Pedicle screw fusion. Posterior  spurring of the superior endplate of L4. Posterior decompression. No significant spinal or foraminal encroachment. L4-5:  Pedicle screw   fusion without stenosis L5-S1: Moderate facet hypertrophy causing subarticular stenosis bilaterally. Posterior decompression. IMPRESSION: Pedicle screw fusion L2 through L5 without complication. Chronic appearing fracture superior endplate of L4. No acute fracture Advanced degenerative change and spurring at T12-L1 without significant spinal stenosis. Electronically Signed   By: Franchot Gallo M.D.   On: 09/06/2016 12:49      IMPRESSION AND PLAN:   Chest pain and near syncope. The patient will be placed for observation.The patient wants to be admitted now for observation. She said she had to stay 3 days to get nurse home placement. But unfortunately, her condition now does not meet inpatient admission criteria. Telemetry monitor, follow-up troponin level, continue aspirin and Coumadin pharmacy to dose. Cardiology consult if necessary.  Dehydration. Gentle rehydration. Chronic A. fib. Continue Coumadin pharmacy to dose.  Hypertension. Continue hypertension medication Diabetes. Start sliding scale.  Chronic back pain. Pain control. Physical therapy in a.m.  All the records are reviewed and case discussed with ED provider. Management plans discussed with the patient, her POA and they are in agreement.  CODE STATUS: Limited to cold.  TOTAL TIME TAKING CARE OF THIS PATIENT:  43 minutes.    Demetrios Loll M.D on 09/06/2016 at 6:01 PM  Between 7am to 6pm - Pager - [phone]  After 6pm go to [email or web address] - Proofreader  [facility]  Office  [phone]  CC: Primary care physician; Lenard Simmer, MD   Note: This dictation was prepared with Dragon dictation along with smaller phrase technology. Any transcriptional errors that result from this process are unintentional.

## 2016-09-06 NOTE — ED Notes (Signed)
Attempted to call report; nurse to call me back. 

## 2016-09-07 DIAGNOSIS — S39012A Strain of muscle, fascia and tendon of lower back, initial encounter: Secondary | ICD-10-CM | POA: Diagnosis not present

## 2016-09-07 LAB — GLUCOSE, CAPILLARY
GLUCOSE-CAPILLARY: 153 mg/dL — AB (ref 65–99)
GLUCOSE-CAPILLARY: 121 mg/dL — AB (ref 65–99)
GLUCOSE-CAPILLARY: 121 mg/dL — AB (ref 65–99)
Glucose-Capillary: 95 mg/dL (ref 65–99)

## 2016-09-07 LAB — PROTIME-INR
INR: 1.42
PROTHROMBIN TIME: 17.5 s — AB (ref 11.4–15.2)

## 2016-09-07 MED ORDER — WARFARIN SODIUM 5 MG PO TABS
10.0000 mg | ORAL_TABLET | Freq: Once | ORAL | Status: AC
  Administered 2016-09-07: 10 mg via ORAL
  Filled 2016-09-07: qty 2

## 2016-09-07 NOTE — Progress Notes (Signed)
[facility] [place] NAME: Debra Butler    MR#:  [id number]  DATE OF BIRTH:  09-02-1921 Admitted yesterday for syncope, back pain, patient heard a popping sound in her back.  Chief Complaint  Patient presents with  . Back Pain  She says her  Back pain is better today, able to ambulate  Better today ,  REVIEW OF SYSTEMS:    Review of Systems  Constitutional: Negative for chills and fever.  HENT: Negative for hearing loss.   Eyes: Negative for blurred vision, double vision and photophobia.  Respiratory: Negative for cough, hemoptysis and shortness of breath.   Cardiovascular: Negative for palpitations, orthopnea and leg swelling.  Gastrointestinal: Negative for abdominal pain, diarrhea and vomiting.  Genitourinary: Negative for dysuria and urgency.  Musculoskeletal: Positive for back pain. Negative for myalgias and neck pain.  Skin: Negative for rash.  Neurological: Negative for dizziness, focal weakness, seizures, weakness and headaches.  Psychiatric/Behavioral: Negative for memory loss. The patient does not have insomnia.     Nutrition:   Tolerating Diet: Tolerating PT:      DRUG ALLERGIES:   Allergies  Allergen Reactions  . Phenergan [Promethazine] Other (See Comments)    Altered mental status  . Sulphur [Sulfur]     VITALS:  Blood pressure 132/80, pulse 80, temperature 98.3 F (36.8 C), temperature source Oral, resp. rate 18, height 5' (1.524 m), weight 88.5 kg (195 lb), SpO2 98 %.  PHYSICAL EXAMINATION:   Physical Exam  GENERAL:  83 y.o.-year-old patient lying in the bed with no acute distress.  EYES: Pupils equal, round, reactive to light and accommodation. No scleral icterus. Extraocular muscles intact.  HEENT: Head atraumatic, normocephalic. Oropharynx and nasopharynx clear.  NECK:  Supple, no jugular venous distention. No thyroid enlargement, no tenderness.  LUNGS: Normal breath sounds bilaterally, no  wheezing, rales,rhonchi or crepitation. No use of accessory muscles of respiration.  CARDIOVASCULAR: S1, S2 normal. No murmurs, rubs, or gallops.  ABDOMEN: Soft, nontender, nondistended. Bowel sounds present. No organomegaly or mass.  EXTREMITIES: No pedal edema, cyanosis, or clubbing.  NEUROLOGIC: Cranial nerves II through XII are intact. Muscle strength 5/5 in all extremities. Sensation intact. Gait not checked.  PSYCHIATRIC: The patient is alert and oriented x 3.  SKIN: No obvious rash, lesion, or ulcer.    LABORATORY PANEL:   CBC  Recent Labs Lab 09/06/16 1407  WBC 5.8  HGB 12.6  HCT 38.5  PLT 108*   ------------------------------------------------------------------------------------------------------------------  Chemistries   Recent Labs Lab 09/06/16 1407  NA 141  K 4.3  CL 98*  CO2 33*  GLUCOSE 110*  BUN 23*  CREATININE 0.97  CALCIUM 9.7  AST 22  ALT 14  ALKPHOS 53  BILITOT 0.7   ------------------------------------------------------------------------------------------------------------------  Cardiac Enzymes  Recent Labs Lab 09/06/16 1616  TROPONINI <0.03   ------------------------------------------------------------------------------------------------------------------  RADIOLOGY:  Ct Lumbar Spine Wo Contrast  Result Date: 09/06/2016 CLINICAL DATA:  Sudden onset back pain.  History of lumbar fusion EXAM: CT LUMBAR SPINE WITHOUT CONTRAST TECHNIQUE: Multidetector CT imaging of the lumbar spine was performed without intravenous contrast administration. Multiplanar CT image reconstructions were also generated. COMPARISON:  Lumbar radiographs 11/04/2009 FINDINGS: Segmentation: Normal Alignment: Normal Vertebrae: Mild fracture superior endplate of L4 appears chronic with associated spurring and endplate irregularity. No acute fracture or mass. Paraspinal and other soft tissues: Atherosclerotic aorta. 6 cm left renal cyst. 15 mm mildly hyperdense left renal  cysts. These are unchanged in size from CT  of 10/30/2009. Disc levels: Pedicle screw fusion L2 through L5. Hardware in satisfactory position. No hardware loosening or failure. T11-12:  Mild degenerative change T12-L1: Advanced disc degeneration with endplate sclerosis and vacuum disc phenomena. Bilateral facet hypertrophy. No significant stenosis. L1-2:  Mild disc degeneration.  Moderate facet degeneration. L2-3:  Pedicle screw   fusion.  No significant stenosis. L3-4: Pedicle screw fusion. Posterior spurring of the superior endplate of L4. Posterior decompression. No significant spinal or foraminal encroachment. L4-5:  Pedicle screw   fusion without stenosis L5-S1: Moderate facet hypertrophy causing subarticular stenosis bilaterally. Posterior decompression. IMPRESSION: Pedicle screw fusion L2 through L5 without complication. Chronic appearing fracture superior endplate of L4. No acute fracture Advanced degenerative change and spurring at T12-L1 without significant spinal stenosis. Electronically Signed   By: Franchot Gallo M.D.   On: 09/06/2016 12:49     ASSESSMENT AND PLAN:   Active Problems:   Chest pain   #1 acute on chronic low back pain: Without evidence of compression fracture on the CAT scan. Physical therapy consulted, spoke with physical therapy recommended 1 more day of observation to see if she can get better with physical therapy  Again tomorrow ,  hopeful that she can go home  Tomorrow with home health, continue Tylenol for pain control .  2 hypothyroidism: Continue Synthroid #3 diabetes mellitus type 2: Continue metformin, and SSI with coverage #4 syncope likely vasovagal, patient is on monitor on telemetry,  No arrythmias noted. \ 5. history of left leg [id number] coumadin  possible  discharge tomorrow. All the records are reviewed and case discussed with Care Management/Social Workerr. Management plans discussed with the patient, family and they are in agreement.  CODE STATUS:  full  TOTAL TIME TAKING CARE OF THIS PATIENT: 35 minutes.   POSSIBLE D/C IN 1DAYS, DEPENDING ON CLINICAL CONDITION.   Epifanio Lesches M.D on 09/07/2016 at 2:07 PM  Between 7am to 6pm - Pager - [phone]  After 6pm go to [email or web address] - password [email or web address] [facility]  Office  [phone]  CC: Primary care physician; Lenard Simmer, MD

## 2016-09-07 NOTE — Care Management (Signed)
Returned call to [facility], patient's HCPOA.  Discussed plans for home health with [facility] PT OT Aide SW and recommendation that patient have someone stay with her over the next few days.  Anticipate discharge 4/13.  A friend will transport her home but will not be available until the afrenoon

## 2016-09-07 NOTE — Care Management (Addendum)
Patient has been evaluated by physical therapy and skilled nursing is recommended.  Patient verbalizes understanding that skilled nursing would not be covered by medicare.  She presented after falling in the shower yesterday but placed in observation after near syncope event in the ED.  Says she now has a bulgng disc "since yesterday when I fell."  Lives alone, able to ambulate at baseline and has walker, wheelchair and canes. Says her shower is handicap equipped and able to perform her personal care and prepare meals.  Friends take her her to MD appointments. Have requested ortho consult as patient indicates that her current back pain is acute. (She does have underlying chronic back pain issues.) Will request that physical therapy reassess today.  Patient is agreeable to home health and requests [facility].  She would like to request the therapist Kathaleen Bury. Notified [facility]. Also has [facility].  She also has questions about palliative care in the home. Says she had heard they can help her get her incontinence supplies and help with meds.  CM discussed that that is not the focus of palliative.

## 2016-09-07 NOTE — Progress Notes (Signed)
ANTICOAGULATION CONSULT NOTE - Initial Consult  Pharmacy Consult for warfarin  Indication: atrial fibrillation  Allergies  Allergen Reactions  . Phenergan [Promethazine] Other (See Comments)    Altered mental status  . [place] [Sulfur]     Patient Measurements: Height: 5' (152.4 cm) Weight: 195 lb (88.5 kg) IBW/kg (Calculated) : 45.5   Vital Signs: Temp: 98.4 F (36.9 C) (04/12 0451) Temp Source: Oral (04/12 0451) BP: 116/72 (04/12 0451) Pulse Rate: 68 (04/12 0451)  Labs:  Recent Labs  09/06/16 1407 09/06/16 1616 09/06/16 1712 09/07/16 0507  HGB 12.6  --   --   --   HCT 38.5  --   --   --   PLT 108*  --   --   --   LABPROT  --   --  17.1* 17.5*  INR  --   --  1.38 1.42  CREATININE 0.97  --   --   --   TROPONINI  --  <0.03  --   --     Estimated Creatinine Clearance: 35.9 mL/min (by C-G formula based on SCr of 0.97 mg/dL).   Medical History: Past Medical History:  Diagnosis Date  . Blood clot in vein 2013   left leg  . Diabetes mellitus without complication ([place]) 4098  . Hypertension 1988  . IBS (irritable bowel syndrome)   . Myocardial infarction      Assessment: 83 yo female with PMH of A. DVT. Pharmacy consulted for warfarin dosing and monitoring.   Home Regimen: Warfarin 7.5 mg Daily   DATE  INR  DOSE 4/11  1.38  9mg   4/12                 1.42                 10 mg   Goal of Therapy:  INR 2-3 Monitor platelets by anticoagulation protocol: Yes   Plan:  4/11 INR subtherapeutic on admission. Will give one time dose of warfarin 10 mg tonight. Will check INR daily until therapeutic.   Larene Beach, PharmD, BCPS Clinical Pharmacist 09/07/2016 11:09 AM

## 2016-09-07 NOTE — Care Management Obs Status (Signed)
[facility] NOTIFICATION   Patient Details  Name: MEELAH TALLO MRN: [id number] Date of Birth: 1921/08/10   Medicare Observation Status Notification Given:  Yes    Katrina Stack, RN 09/07/2016, 9:59 AM

## 2016-09-07 NOTE — Progress Notes (Signed)
Physical Therapy Treatment Patient Details Name: Debra Butler MRN: [id number] DOB: 1921-12-07 Today's Date: 09/07/2016    History of Present Illness presented to ER with onset of back pain after bending down, heard a "pop"; imaging negative for acute injury.  Admitted under observation secondary to syncopal episode and for pain control.    PT Comments    Marked improvement in pain control and overall activity tolerance.  Able to complete bed mobility, sit/stand, basic transfers and short-distance gait with RW, min assist.  No overt buckling or LOB, but does demonstrate decreased overall safety/stability due to persistent pain and mild dizziness (2/10) with upright transition (vitals stable and WFL).  Although improved, does not demonstrate current ability to indep negotiate home environment; continue to recommend STR, but will monitor progress next session and update recommendations if/when appropriate.   Follow Up Recommendations  SNF     Equipment Recommendations  Rolling walker with 5" wheels    Recommendations for Other Services       Precautions / Restrictions Precautions Precautions: Fall Restrictions Weight Bearing Restrictions: No    Mobility  Bed Mobility Overal bed mobility: Needs Assistance Bed Mobility: Supine to Sit     Supine to sit: Supervision     General bed mobility comments: heavy use of bedrails; increased time required.  Limited carry-over of log rolling instruction from previous date.  Transfers Overall transfer level: Needs assistance Equipment used: Rolling walker (2 wheeled) Transfers: Sit to/from Stand Sit to Stand: Min guard;Min assist         General transfer comment: min cuing for hand placement; increased time required  Ambulation/Gait Ambulation/Gait assistance: Min guard;+2 safety/equipment Ambulation Distance (Feet): 35 Feet Assistive device: Rolling walker (2 wheeled)       General Gait Details: forward flexed posture,  decreased cadence/gait speed with decreased step height/length bilat.  Decreased weight shift/stance time R LE due to pain; no overt buckling or LOB noted.  Unable to tolerate additional distance due to pain/fatigue.   Stairs            Wheelchair Mobility    Modified Rankin (Stroke Patients Only)       Balance Overall balance assessment: Needs assistance Sitting-balance support: No upper extremity supported;Feet supported Sitting balance-Leahy Scale: Good     Standing balance support: Bilateral upper extremity supported Standing balance-Leahy Scale: Fair                              Cognition Arousal/Alertness: Awake/alert Behavior During Therapy: WFL for tasks assessed/performed Overall Cognitive Status: Within Functional Limits for tasks assessed                                        Exercises Other Exercises Other Exercises: Sit/stand from edge of bed, recliner with RW, cga/close sup; requires UE support to complete.  Very slow and guarded; motivated to complete as indep as possible Other Exercises: Orthostatic assessment (see flowsheet).  Vitals stable and WFL with transition to upright; minimal (2/10) reports of dizziness/lightheadedness with transition to upright this date.    General Comments        Pertinent Vitals/Pain Pain Assessment: Faces Faces Pain Scale: Hurts even more Pain Location: back Pain Descriptors / Indicators: Aching;Grimacing;Guarding Pain Intervention(s): Limited activity within patient's tolerance;Monitored during session;Repositioned;Premedicated before session    Home Living  Prior Function            PT Goals (current goals can now be found in the care plan section) Acute Rehab PT Goals Patient Stated Goal: to go to rehab PT Goal Formulation: With patient Time For Goal Achievement: 09/20/16 Potential to Achieve Goals: Good Progress towards PT goals: Progressing toward  goals    Frequency    Min 2X/week      PT Plan Current plan remains appropriate    Co-evaluation             End of Session Equipment Utilized During Treatment: Gait belt Activity Tolerance: Patient tolerated treatment well Patient left: in chair;with call bell/phone within reach;with chair alarm set Nurse Communication: Mobility status (informed of need for assist with toileting/hygiene end of session) PT Visit Diagnosis: Muscle weakness (generalized) (M62.81);Pain Pain - Right/Left: Right Pain - part of body: Leg     Time: 4718-5501 PT Time Calculation (min) (ACUTE ONLY): 23 min  Charges:  $Gait Training: 8-22 mins $Therapeutic Activity: 8-22 mins                    G Codes:       Subrena Devereux H. Owens Shark, PT, DPT, NCS 09/07/16, 2:07 PM [phone]

## 2016-09-08 DIAGNOSIS — S39012A Strain of muscle, fascia and tendon of lower back, initial encounter: Secondary | ICD-10-CM | POA: Diagnosis not present

## 2016-09-08 LAB — PROTIME-INR
INR: 1.49
PROTHROMBIN TIME: 18.2 s — AB (ref 11.4–15.2)

## 2016-09-08 LAB — GLUCOSE, CAPILLARY
GLUCOSE-CAPILLARY: 112 mg/dL — AB (ref 65–99)
GLUCOSE-CAPILLARY: 113 mg/dL — AB (ref 65–99)

## 2016-09-08 MED ORDER — WARFARIN SODIUM 5 MG PO TABS: 7.5000 mg | ORAL_TABLET | Freq: Once | ORAL | Status: DC

## 2016-09-08 NOTE — Progress Notes (Signed)
Pt has orders to be discharged home. Discharge instructions given and pt has no additional questions at this time. Medication regimen reviewed and pt educated. Pt verbalized understanding and has no additional questions. Telemetry box removed. IV removed and site in good condition. Pt stable and waiting for transportation.   Maurene Capes RN

## 2016-09-08 NOTE — Progress Notes (Signed)
ANTICOAGULATION CONSULT NOTE - Initial Consult  Pharmacy Consult for warfarin  Indication: atrial fibrillation  Allergies  Allergen Reactions  . Phenergan [Promethazine] Other (See Comments)    Altered mental status  . [place] [Sulfur]     Patient Measurements: Height: 5' (152.4 cm) Weight: 195 lb (88.5 kg) IBW/kg (Calculated) : 45.5   Vital Signs: Temp: 97.7 F (36.5 C) (04/13 0848) Temp Source: Oral (04/13 0848) BP: 127/71 (04/13 0848) Pulse Rate: 72 (04/13 0848)  Labs:  Recent Labs  09/06/16 1407 09/06/16 1616 09/06/16 1712 09/07/16 0507 09/08/16 0520  HGB 12.6  --   --   --   --   HCT 38.5  --   --   --   --   PLT 108*  --   --   --   --   LABPROT  --   --  17.1* 17.5* 18.2*  INR  --   --  1.38 1.42 1.49  CREATININE 0.97  --   --   --   --   TROPONINI  --  <0.03  --   --   --     Estimated Creatinine Clearance: 35.9 mL/min (by C-G formula based on SCr of 0.97 mg/dL).   Medical History: Past Medical History:  Diagnosis Date  . Blood clot in vein 2013   left leg  . Diabetes mellitus without complication ([place]) 4097  . Hypertension 1988  . IBS (irritable bowel syndrome)   . Myocardial infarction      Assessment: 83 yo female with PMH of A. DVT. Pharmacy consulted for warfarin dosing and monitoring.   Home Regimen: Warfarin 7.5 mg Daily   DATE  INR  DOSE 4/11  1.38  9mg   4/12                 1.42                 10 mg  4/13                 1.49                  7.5 mg  Goal of Therapy:  INR 2-3 Monitor platelets by anticoagulation protocol: Yes   Plan:   INR subtherapeutic on admission. Will give one time dose of warfarin 7.5 mg (home dose) tonight. Will check INR daily until therapeutic.   Larene Beach, PharmD, BCPS Clinical Pharmacist 09/08/2016 9:07 AM

## 2016-09-08 NOTE — Progress Notes (Signed)
[place] [facility] [place] NAME: Debra Butler    MR#:  [id number]  DATE OF BIRTH:  1922-05-11 Admitted yesterday for syncope, back pain, patient heard a popping sound in her back. Patient says that her back pain is better. Recommended skilled nursing, patient wants to go to [facility] and she lives alone and she does not want to go home with home health. She knows that she is observation status but she is willing to pay he told me that she is planning to sell the house and planning to go to assisted living.  Chief Complaint  Patient presents with  . Back Pain  She says her  Back pain is better today, able to ambulate  Better today ,  REVIEW OF SYSTEMS:    Review of Systems  Constitutional: Negative for chills and fever.  HENT: Negative for hearing loss.   Eyes: Negative for blurred vision, double vision and photophobia.  Respiratory: Negative for cough, hemoptysis and shortness of breath.   Cardiovascular: Negative for palpitations, orthopnea and leg swelling.  Gastrointestinal: Negative for abdominal pain, diarrhea and vomiting.  Genitourinary: Negative for dysuria and urgency.  Musculoskeletal: Positive for back pain. Negative for myalgias and neck pain.  Skin: Negative for rash.  Neurological: Negative for dizziness, focal weakness, seizures, weakness and headaches.  Psychiatric/Behavioral: Negative for memory loss. The patient does not have insomnia.     Nutrition:   Tolerating Diet: Tolerating PT:      DRUG ALLERGIES:   Allergies  Allergen Reactions  . Phenergan [Promethazine] Other (See Comments)    Altered mental status  . [place] [Sulfur]     VITALS:  Blood pressure 104/67, pulse 80, temperature 97.7 F (36.5 C), temperature source Oral, resp. rate 12, height 5' (1.524 m), weight 88.5 kg (195 lb), SpO2 95 %.  PHYSICAL EXAMINATION:   Physical Exam  GENERAL:  83 y.o.-year-old patient lying in the bed with no acute  distress.  EYES: Pupils equal, round, reactive to light and accommodation. No scleral icterus. Extraocular muscles intact.  HEENT: Head atraumatic, normocephalic. Oropharynx and nasopharynx clear.  NECK:  Supple, no jugular venous distention. No thyroid enlargement, no tenderness.  LUNGS: Normal breath sounds bilaterally, no wheezing, rales,rhonchi or crepitation. No use of accessory muscles of respiration.  CARDIOVASCULAR: S1, S2 normal. No murmurs, rubs, or gallops.  ABDOMEN: Soft, nontender, nondistended. Bowel sounds present. No organomegaly or mass.  EXTREMITIES: No pedal edema, cyanosis, or clubbing.  NEUROLOGIC: Cranial nerves II through XII are intact. Muscle strength 5/5 in all extremities. Sensation intact. Gait not checked.  PSYCHIATRIC: The patient is alert and oriented x 3.  SKIN: No obvious rash, lesion, or ulcer.    LABORATORY PANEL:   CBC  Recent Labs Lab 09/06/16 1407  WBC 5.8  HGB 12.6  HCT 38.5  PLT 108*   ------------------------------------------------------------------------------------------------------------------  Chemistries   Recent Labs Lab 09/06/16 1407  NA 141  K 4.3  CL 98*  CO2 33*  GLUCOSE 110*  BUN 23*  CREATININE 0.97  CALCIUM 9.7  AST 22  ALT 14  ALKPHOS 53  BILITOT 0.7   ------------------------------------------------------------------------------------------------------------------  Cardiac Enzymes  Recent Labs Lab 09/06/16 1616  TROPONINI <0.03   ------------------------------------------------------------------------------------------------------------------  RADIOLOGY:  No results found.   ASSESSMENT AND PLAN:   Active Problems:   Chest pain   #1 acute on chronic low back pain: Without evidence of compression fracture on the CAT scan. Physical therapy consulted, Recommended Skilled Nursing. Patient Wants  to Go to [facility]. .  2 hypothyroidism: Continue Synthroid #3 diabetes mellitus type 2: Continue  metformin, and SSI with coverage #4 syncope likely vasovagal, patient is on monitor on telemetry,  No arrythmias noted. \ 5. history of left leg HOY:[id number] coumadin She does not want to go home with home health.  All the records are reviewed and case discussed with Care Management/Social Workerr. Management plans discussed with the patient, family and they are in agreement.  CODE STATUS: full  TOTAL TIME TAKING CARE OF THIS PATIENT: 35 minutes.   POSSIBLE D/C IN 1DAYS, DEPENDING ON CLINICAL CONDITION.   Epifanio Lesches M.D on 09/08/2016 at 1:00 PM  Between 7am to 6pm - Pager - [phone]  After 6pm go to [email or web address] - password [email or web address] [facility]  Office  [phone]  CC: Primary care physician; Lenard Simmer, MD

## 2016-09-08 NOTE — Progress Notes (Signed)
Physical Therapy Treatment Patient Details Name: Debra Butler MRN: [id number] DOB: 1922/04/18 Today's Date: 09/08/2016    History of Present Illness Presented to ER with onset of back pain after bending down, heard a "pop"; imaging negative for acute injury.  Admitted under observation secondary to syncopal episode and for pain control.    PT Comments    Pt continues to demonstrate instability and weakness with transfers. During first transfer from bed today pt falls over backwards onto bed. Upon standing today she reports feeling "swimmy-headed." Vitals obtained and are WNL. Symptoms resolve after period of sitting back on bed. Pt with RLE buckling during ambulation today. She remains limited in her ambulation distance due to pain, fatigue, and intermittent RLE buckling. Recommendation from PT remains SNF placement. If pt discharges home please arrange [facility]. Pt will benefit from skilled PT services to address deficits in strength, balance, and mobility in order to return to full function at home.    Follow Up Recommendations  SNF     Equipment Recommendations  Rolling walker with 5" wheels    Recommendations for Other Services       Precautions / Restrictions Precautions Precautions: Fall Restrictions Weight Bearing Restrictions: No    Mobility  Bed Mobility Overal bed mobility: Needs Assistance Bed Mobility: Supine to Sit     Supine to sit: Supervision     General bed mobility comments: Heavy use of bedrails; increased time required.   Transfers Overall transfer level: Needs assistance Equipment used: Rolling walker (2 wheeled) Transfers: Sit to/from Stand Sit to Stand: Min guard         General transfer comment: Pt demonstrates safe hand placement today. Once upright requires some time for "swimmy-headed" sensation to pass. Vitals obtained and BP and HR WNL. Peformed transfer with patient to/from [facility]. During first attempt pt loses balance and falls over  backwards onto bed  Ambulation/Gait Ambulation/Gait assistance: Min guard;+2 safety/equipment Ambulation Distance (Feet): 80 Feet Assistive device: Rolling walker (2 wheeled) Gait Pattern/deviations: Decreased step length - right;Decreased step length - left Gait velocity: Decreased Gait velocity interpretation: <1.8 ft/sec, indicative of risk for recurrent falls General Gait Details: Pt ambulates with forward flexed posture, infrequent RLE buckling noted during ambulation. Close CGA+2 for safety due to history of syncope, RLE buckling, and increased back pain limiting mobility. No overt LOB withUE support   Financial trader Rankin (Stroke Patients Only)       Balance Overall balance assessment: Needs assistance Sitting-balance support: No upper extremity supported;Feet supported Sitting balance-Leahy Scale: Good     Standing balance support: Bilateral upper extremity supported Standing balance-Leahy Scale: Fair Standing balance comment: Pt requires UE support on rolling walker secondary to pain                            Cognition Arousal/Alertness: Awake/alert Behavior During Therapy: WFL for tasks assessed/performed Overall Cognitive Status: Within Functional Limits for tasks assessed                                        Exercises General Exercises - Lower Extremity Long Arc Quad: Strengthening;Both;10 reps;Seated Heel Slides: Strengthening;Both;10 reps;Seated Hip ABduction/ADduction: Strengthening;Both;10 reps;Seated Hip Flexion/Marching: Strengthening;Both;10 reps;Seated Heel Raises: Strengthening;Both;10 reps;Seated    General Comments  Pertinent Vitals/Pain Pain Assessment: Faces Faces Pain Scale: Hurts even more Pain Location: back Pain Descriptors / Indicators: Aching;Grimacing;Guarding Pain Intervention(s): Monitored during session    Home Living                       Prior Function            PT Goals (current goals can now be found in the care plan section) Acute Rehab PT Goals Patient Stated Goal: to go to rehab PT Goal Formulation: With patient Time For Goal Achievement: 09/20/16 Potential to Achieve Goals: Good Progress towards PT goals: Progressing toward goals    Frequency    Min 2X/week      PT Plan Current plan remains appropriate    Co-evaluation             End of Session Equipment Utilized During Treatment: Gait belt Activity Tolerance: Patient tolerated treatment well Patient left: in chair;with call bell/phone within reach;with chair alarm set Nurse Communication: Mobility status (Pt ready for bath) PT Visit Diagnosis: Muscle weakness (generalized) (M62.81);Pain Pain - Right/Left: Right Pain - part of body: Leg     Time: 4461-9012 PT Time Calculation (min) (ACUTE ONLY): 29 min  Charges:  $Gait Training: 8-22 mins $Therapeutic Exercise: 8-22 mins                    G Codes:       Lyndel Safe Wister Hoefle PT, DPT     Moriah Loughry 09/08/2016, 10:59 AM

## 2016-09-08 NOTE — Plan of Care (Signed)
Problem: Pain Managment: Goal: General experience of comfort will improve Outcome: Progressing Pt complained of headache, treated with tylenol with relief.  Problem: Tissue Perfusion: Goal: Risk factors for ineffective tissue perfusion will decrease Outcome: Progressing lovenox for VTE  Problem: Bowel/Gastric: Goal: Will not experience complications related to bowel motility Outcome: Completed/Met Date Met: 09/08/16 Pt had BM this shift

## 2016-09-14 NOTE — Discharge Summary (Signed)
Debra Butler, is a 83 y.o. female  DOB August 06, 1921  MRN [id number].  Admission date:  09/06/2016  Admitting Physician  Demetrios Loll, MD  Discharge Date:  09/08/2016   Primary MD  Lenard Simmer, MD  Recommendations for primary care physician for things to follow:   Follow-up with primary doctor in 1 week   Admission Diagnosis  Strain of lumbar region, initial encounter [S39.012A] Syncope, unspecified syncope type [R55] Chest pain, unspecified type [R07.9]   Discharge Diagnosis  Strain of lumbar region, initial encounter [S39.012A] Syncope, unspecified syncope type [R55] Chest pain, unspecified type [R07.9]    Active Problems:   Chest pain      Past Medical History:  Diagnosis Date  . Blood clot in vein 2013   left leg  . Diabetes mellitus without complication ([place]) 1194  . Hypertension 1988  . IBS (irritable bowel syndrome)   . Myocardial infarction [facility])     Past Surgical History:  Procedure Laterality Date  . ABDOMINAL HYSTERECTOMY  age 65   partial, still has ovaries and tubes  . APPENDECTOMY    . BACK SURGERY    . BREAST BIOPSY     at age 49, benign  . CARPAL TUNNEL RELEASE Right 2010  . CHOLECYSTECTOMY    . COLON SURGERY  1984  . COLONOSCOPY  2007, 2016   Dr Jamal Collin  . FOOT SURGERY    . FRACTURE SURGERY  2010   hip  . FRACTURE SURGERY  2009   femur  . JOINT REPLACEMENT     x 2  . PACEMAKER PLACEMENT  2013  . TONSILLECTOMY    . UPPER GI ENDOSCOPY  2016   Dr Jamal Collin       History of present illness and  Hospital Course:     Kindly see H&P for history of present illness and admission details, please review complete Labs, Consult reports and Test reports for all details in brief  HPI  from the history and physical done on the day of admission 83 year old female who lives by herself  came in because of back pain after bending down and heard a pop sound. Admitted for eval of syncope.  Hospital Course  1.Vasovagal  Syncope;Monitored on telemetry, no arrhythmias.  2.Acute on chronic low back pain: Without evidence of compression fracture on the CAT scan. Physical therapy consulted. Physical exam. Recommended home health with physical therapy, nursing, home health aide. Continue Tylenol for pain control. Patient did not want narcotics. #3 diabetes mellitus type 2: Continue metformin, #4/ history of DVT: Continue Coumadin.  Hypothyroidism: Continue  Thyroid supplements. #5. essential hypertension: Controlled  Discharge Condition: stable   Follow UP  Follow-up Information    [facility] Follow up.   Why:  [place], physical therapy, occupational therapy, aide SW Contact information: [address] [place] [address] [phone]             Discharge Instructions  and  Discharge Medications     Discharge Instructions    Ambulatory referral to [facility] [place]    Complete by:  As directed    Please evaluate Haydin TRUDE CANSLER for admission to [facility].  Disciplines requested: Nursing, Physical Therapy, Occupational Therapy, Medical Social Work and [place] to provide: Strengthening Exercises and Evaluate  Physician to follow patient's care (the person listed here will be responsible for signing ongoing orders): PCP  Requested Start of Care Date: Tomorrow  I certify that this patient is  under my care and that I, or a Nurse Practitioner or Physician's Assistant working with me, had a face-to-face encounter that meets the physician face-to-face requirements with patient on 09/06/16. The encounter with the patient was in whole, or in part for the following medical condition(s) which is the primary reason for home health care (List medical condition). Hypertension, lumbar radiculopathy  Special Instructions: none   Does  the patient have Medicare or Medicaid?:  Yes   The encounter with the patient was in whole, or in part, for the following medical condition, which is the primary reason for home health care:  lumbar radiculopathy   Reason for Medically Necessary Home Health Services:  Therapy- Personnel officer, Public librarian   My clinical findings support the need for the above services:  Pain interferes with ambulation/mobility   I certify that, based on my findings, the following services are medically necessary home health services:   Nursing Physical therapy     Further, I certify that my clinical findings support that this patient is homebound due to:  Pain interferes with ambulation/mobility   Face-to-face encounter (required for Medicare/Medicaid patients)    Complete by:  As directed    I Epifanio Lesches certify that this patient is under my care and that I, or a nurse practitioner or physician's assistant working with me, had a face-to-face encounter that meets the physician face-to-face encounter requirements with this patient on 09/08/2016. The encounter with the patient was in whole, or in part for the following medical condition(s) which is the primary reason for home health care  Acute on chronic low back pain, essential hypertension, history of DVT, syncope, fall.   The encounter with the patient was in whole, or in part, for the following medical condition, which is the primary reason for home health care:  whole   I certify that, based on my findings, the following services are medically necessary home health services:   Nursing Physical therapy     Reason for Medically Necessary Home Health Services:  Therapy- Personnel officer, Public librarian   My clinical findings support the need for the above services:  Unable to leave home safely without assistance and/or assistive device   Further, I certify that my clinical findings support that this patient is  homebound due to:  Unable to leave home safely without assistance   Home Health    Complete by:  As directed    To provide the following care/treatments:   PT OT RN [place]       Allergies as of 09/08/2016      Reactions   Phenergan [promethazine] Other (See Comments)   Altered mental status   Sulphur [sulfur]       Medication List    TAKE these medications   acetaminophen 325 MG tablet Commonly known as:  TYLENOL Take 650 mg by mouth every 6 (six) hours as needed.   aspirin 81 MG tablet Take 81 mg by mouth daily.   diltiazem 180 MG 24 hr capsule Commonly known as:  DILACOR XR Take 180 mg by mouth daily.   furosemide 20 MG tablet Commonly known as:  LASIX Take 80 mg by mouth 2 (two) times daily.   hydrocortisone-pramoxine 2.5-1 % rectal cream Commonly known as:  ANALPRAM HC Place 1 application rectally 3 (three) times daily.   levothyroxine 112 MCG tablet Commonly known as:  SYNTHROID, LEVOTHROID Take 112 mcg by mouth daily before breakfast.   metFORMIN 500  MG tablet Commonly known as:  GLUCOPHAGE Take 1,000 mg by mouth 2 (two) times daily with a meal.   nitroGLYCERIN 2.5 MG CR capsule Take 2.5 mg by mouth as needed.   potassium chloride 20 MEQ packet Commonly known as:  KLOR-CON Take 20 mEq by mouth 2 (two) times daily.   pregabalin 100 MG capsule Commonly known as:  LYRICA Take 100 mg by mouth 2 (two) times daily.   traMADol 50 MG tablet Commonly known as:  ULTRAM Take 1 tablet (50 mg total) by mouth every 6 (six) hours as needed. What changed:  Another medication with the same name was added. Make sure you understand how and when to take each.   traMADol 50 MG tablet Commonly known as:  ULTRAM Take 1 tablet (50 mg total) by mouth every 6 (six) hours as needed. What changed:  You were already taking a medication with the same name, and this prescription was added. Make sure you understand how and when to take each.   warfarin 7.5 MG  tablet Commonly known as:  COUMADIN Take 7.5 mg by mouth one time only at 6 PM.         Diet and Activity recommendation: See Discharge Instructions above   Consults obtained - PT   Major procedures and Radiology Reports - PLEASE review detailed and final reports for all details, in brief -   \   Ct Lumbar Spine Wo Contrast  Result Date: 09/06/2016 CLINICAL DATA:  Sudden onset back pain.  History of lumbar fusion EXAM: CT LUMBAR SPINE WITHOUT CONTRAST TECHNIQUE: Multidetector CT imaging of the lumbar spine was performed without intravenous contrast administration. Multiplanar CT image reconstructions were also generated. COMPARISON:  Lumbar radiographs 11/04/2009 FINDINGS: Segmentation: Normal Alignment: Normal Vertebrae: Mild fracture superior endplate of L4 appears chronic with associated spurring and endplate irregularity. No acute fracture or mass. Paraspinal and other soft tissues: Atherosclerotic aorta. 6 cm left renal cyst. 15 mm mildly hyperdense left renal cysts. These are unchanged in size from CT of 10/30/2009. Disc levels: Pedicle screw fusion L2 through L5. Hardware in satisfactory position. No hardware loosening or failure. T11-12:  Mild degenerative change T12-L1: Advanced disc degeneration with endplate sclerosis and vacuum disc phenomena. Bilateral facet hypertrophy. No significant stenosis. L1-2:  Mild disc degeneration.  Moderate facet degeneration. L2-3:  Pedicle screw   fusion.  No significant stenosis. L3-4: Pedicle screw fusion. Posterior spurring of the superior endplate of L4. Posterior decompression. No significant spinal or foraminal encroachment. L4-5:  Pedicle screw   fusion without stenosis L5-S1: Moderate facet hypertrophy causing subarticular stenosis bilaterally. Posterior decompression. IMPRESSION: Pedicle screw fusion L2 through L5 without complication. Chronic appearing fracture superior endplate of L4. No acute fracture Advanced degenerative change and  spurring at T12-L1 without significant spinal stenosis. Electronically Signed   By: Franchot Gallo M.D.   On: 09/06/2016 12:49    Micro Results     No results found for this or any previous visit (from the past 240 hour(s)).     Today   Subjective:   [place] Hover today has no headache,no chest abdominal pain,no new weakness tingling or numbness, feels much better wants to go home today.   Objective:   Blood pressure 104/67, pulse 80, temperature 97.7 F (36.5 C), temperature source Oral, resp. rate 12, height 5' (1.524 m), weight 88.5 kg (195 lb), SpO2 95 %.  No intake or output data in the 24 hours ending 09/14/16 1343  Exam Awake Alert, Oriented x 3,  No new F.N deficits, Normal affect [place].AT,PERRAL Supple Neck,No JVD, No cervical lymphadenopathy appriciated.  Symmetrical Chest wall movement, Good air movement bilaterally, CTAB RRR,No Gallops,Rubs or new Murmurs, No Parasternal Heave +ve B.Sounds, Abd Soft, Non tender, No organomegaly appriciated, No rebound -guarding or rigidity. No Cyanosis, Clubbing or edema, No new Rash or bruise  Data Review   CBC w Diff:  Lab Results  Component Value Date   WBC 5.8 09/06/2016   HGB 12.6 09/06/2016   HGB 13.9 03/31/2014   HCT 38.5 09/06/2016   HCT 42.5 03/31/2014   PLT 108 (L) 09/06/2016   PLT 110 (L) 03/31/2014   LYMPHOPCT 11 09/06/2016   LYMPHOPCT 8.6 03/31/2014   MONOPCT 9 09/06/2016   MONOPCT 6.6 03/31/2014   EOSPCT 2 09/06/2016   EOSPCT 0.4 03/31/2014   BASOPCT 1 09/06/2016   BASOPCT 0.4 03/31/2014    CMP:  Lab Results  Component Value Date   NA 141 09/06/2016   NA 139 03/31/2014   K 4.3 09/06/2016   K 4.4 03/31/2014   CL 98 (L) 09/06/2016   CL 102 03/31/2014   CO2 33 (H) 09/06/2016   CO2 32 03/31/2014   BUN 23 (H) 09/06/2016   BUN 17 03/31/2014   CREATININE 0.97 09/06/2016   CREATININE 1.06 03/31/2014   PROT 7.0 09/06/2016   PROT 6.8 05/12/2012   ALBUMIN 4.2 09/06/2016   ALBUMIN 3.8 05/12/2012    BILITOT 0.7 09/06/2016   BILITOT 0.6 05/12/2012   ALKPHOS 53 09/06/2016   ALKPHOS 82 05/12/2012   AST 22 09/06/2016   AST 18 05/12/2012   ALT 14 09/06/2016   ALT 18 05/12/2012  .   Total Time in preparing paper work, data evaluation and todays exam - 106 minutes  Ladd Cen M.D on 09/08/2016 at 1:43 PM    Note: This dictation was prepared with Dragon dictation along with smaller phrase technology. Any transcriptional errors that result from this process are unintentional.

## 2016-11-10 ENCOUNTER — Ambulatory Visit (INDEPENDENT_AMBULATORY_CARE_PROVIDER_SITE_OTHER): Payer: Medicare Other | Admitting: Vascular Surgery

## 2016-11-10 ENCOUNTER — Encounter (INDEPENDENT_AMBULATORY_CARE_PROVIDER_SITE_OTHER): Payer: Self-pay | Admitting: Vascular Surgery

## 2016-11-10 VITALS — BP 125/74 | HR 80 | Resp 16 | Ht 62.0 in | Wt 206.0 lb

## 2016-11-10 DIAGNOSIS — L97921 Non-pressure chronic ulcer of unspecified part of left lower leg limited to breakdown of skin: Secondary | ICD-10-CM | POA: Diagnosis not present

## 2016-11-10 DIAGNOSIS — E118 Type 2 diabetes mellitus with unspecified complications: Secondary | ICD-10-CM | POA: Diagnosis not present

## 2016-11-10 DIAGNOSIS — I890 Lymphedema, not elsewhere classified: Secondary | ICD-10-CM

## 2016-11-14 NOTE — Progress Notes (Signed)
Subjective:    Patient ID: Debra Butler, female    DOB: 08-22-1921, 83 y.o.   MRN: [id number] Chief Complaint  Patient presents with  . Ulcer    Recheck/Ultrasound follow up   Patient presents for evaluation of the left lower extremity shin blister. The patient endorses a history of a "large blister" formation to the left shin yesterday afternoon. The patient has had blisters in the past which have required unna boot wraps. The patient does engage in medical grade one compression and elevation of her lower extremity. The patient denies any erythema to the lower extremity. The patient denies any fever, nausea or vomiting.   Review of Systems  Constitutional: Negative.   HENT: Negative.   Eyes: Negative.   Respiratory: Negative.   Cardiovascular: Positive for leg swelling.  Gastrointestinal: Negative.   Endocrine: Negative.   Genitourinary: Negative.   Musculoskeletal: Negative.   Skin: Positive for wound.  Allergic/Immunologic: Negative.   Neurological: Negative.   Hematological: Negative.   Psychiatric/Behavioral: Negative.        Objective:   Physical Exam  Constitutional: She is oriented to person, place, and time. She appears well-developed and well-nourished. No distress.  HENT:  Head: Normocephalic and atraumatic.  Eyes: Conjunctivae are normal. Pupils are equal, round, and reactive to light.  Neck: Normal range of motion.  Cardiovascular: Normal rate, regular rhythm, normal heart sounds and intact distal pulses.   Pulses:      Radial pulses are 2+ on the right side, and 2+ on the left side.       Dorsalis pedis pulses are 2+ on the right side, and 2+ on the left side.       Posterior tibial pulses are 2+ on the right side, and 2+ on the left side.  Pulmonary/Chest: Effort normal.  Musculoskeletal: Normal range of motion. She exhibits edema (Minimal edema to the bilateral lower extremity).  Neurological: She is alert and oriented to person, place, and time.    Skin: She is not diaphoretic.     3 cm x 3 cm blister located to the left shin. Blister is intact. There is clear fluid noted inside blister. There is no cellulitis to the lower extremity. There are no additional wounds noted  Psychiatric: She has a normal mood and affect. Her behavior is normal. Judgment and thought content normal.  Vitals reviewed.   BP 125/74 (BP Location: Right Arm)   Pulse 80   Resp 16   Ht 5\' 2"  (1.575 m)   Wt 206 lb (93.4 kg)   BMI 37.68 kg/m   Past Medical History:  Diagnosis Date  . Blood clot in vein 2013   left leg  . Diabetes mellitus without complication ([place]) 0932  . Hypertension 1988  . IBS (irritable bowel syndrome)   . Myocardial infarction [facility])     Social History   Social History  . Marital status: Married    Spouse name: N/A  . Number of children: N/A  . Years of education: N/A   Occupational History  . Not on file.   Social History Main Topics  . Smoking status: Never Smoker  . Smokeless tobacco: Never Used  . Alcohol use No  . Drug use: No  . Sexual activity: Not on file   Other Topics Concern  . Not on file   Social History Narrative  . No narrative on file    Past Surgical History:  Procedure Laterality Date  . ABDOMINAL HYSTERECTOMY  age 64  partial, still has ovaries and tubes  . APPENDECTOMY    . BACK SURGERY    . BREAST BIOPSY     at age 9, benign  . CARPAL TUNNEL RELEASE Right 2010  . CHOLECYSTECTOMY    . COLON SURGERY  1984  . COLONOSCOPY  2007, 2016   Dr Jamal Collin  . FOOT SURGERY    . FRACTURE SURGERY  2010   hip  . FRACTURE SURGERY  2009   femur  . JOINT REPLACEMENT     x 2  . PACEMAKER PLACEMENT  2013  . TONSILLECTOMY    . UPPER GI ENDOSCOPY  2016   Dr Jamal Collin    No family history on file.  Allergies  Allergen Reactions  . Phenergan [Promethazine] Other (See Comments)    Altered mental status  . Sulphur [Sulfur]        Assessment & Plan:  Patient presents for evaluation of the  left lower extremity shin blister. The patient endorses a history of a "large blister" formation to the left shin yesterday afternoon. The patient has had blisters in the past which have required unna boot wraps. The patient does engage in medical grade one compression and elevation of her lower extremity. The patient denies any erythema to the lower extremity. The patient denies any fever, nausea or vomiting.  1. Lymphedema - stable This is generally well controlled by medical grade one compression and elevation. Even though this is well-controlled with conservative therapy the patient does have periodic blister formation. The patient was placed in a left lower extremity and a wrap. The patient knows that blister may pop. Patient encouraged to elevate her legs. Recommend four weeks of left lower extremity unna wraps. Patient follow-up in 1 month for a wound check.  2. Lower extremity ulceration, left, limited to breakdown of skin ([place]) - new As above  3. Type 2 diabetes mellitus with complication, without long-term current use of insulin (HCC) - stable Encouraged good control as its slows the progression of atherosclerotic disease   Current Outpatient Prescriptions on File Prior to Visit  Medication Sig Dispense Refill  . acetaminophen (TYLENOL) 325 MG tablet Take 650 mg by mouth every 6 (six) hours as needed.    Marland Kitchen aspirin 81 MG tablet Take 81 mg by mouth daily.    Marland Kitchen diltiazem (DILACOR XR) 180 MG 24 hr capsule Take 180 mg by mouth daily.    . furosemide (LASIX) 20 MG tablet Take 80 mg by mouth 2 (two) times daily.     . hydrocortisone-pramoxine (ANALPRAM HC) 2.5-1 % rectal cream Place 1 application rectally 3 (three) times daily. 30 g 2  . levothyroxine (SYNTHROID, LEVOTHROID) 112 MCG tablet Take 112 mcg by mouth daily before breakfast.    . metFORMIN (GLUCOPHAGE) 500 MG tablet Take 1,000 mg by mouth 2 (two) times daily with a meal.     . nitroGLYCERIN 2.5 MG CR capsule Take 2.5 mg by  mouth as needed.    . potassium chloride (KLOR-CON) 20 MEQ packet Take 20 mEq by mouth 2 (two) times daily.    . pregabalin (LYRICA) 100 MG capsule Take 100 mg by mouth 2 (two) times daily.    . traMADol (ULTRAM) 50 MG tablet Take 1 tablet (50 mg total) by mouth every 6 (six) hours as needed. 15 tablet 0  . traMADol (ULTRAM) 50 MG tablet Take 1 tablet (50 mg total) by mouth every 6 (six) hours as needed. 15 tablet 0  . warfarin (COUMADIN) 7.5  MG tablet Take 7.5 mg by mouth one time only at 6 PM.      No current facility-administered medications on file prior to visit.     There are no Patient Instructions on file for this visit. No Follow-up on file.   Jeet Shough A Jadalee Westcott, PA-C

## 2016-11-17 ENCOUNTER — Encounter (INDEPENDENT_AMBULATORY_CARE_PROVIDER_SITE_OTHER): Payer: Medicare Other

## 2016-11-20 ENCOUNTER — Ambulatory Visit (INDEPENDENT_AMBULATORY_CARE_PROVIDER_SITE_OTHER): Payer: Medicare Other | Admitting: Vascular Surgery

## 2016-11-20 ENCOUNTER — Encounter (INDEPENDENT_AMBULATORY_CARE_PROVIDER_SITE_OTHER): Payer: Self-pay | Admitting: Vascular Surgery

## 2016-11-20 VITALS — BP 110/64 | HR 89 | Resp 16 | Wt 208.0 lb

## 2016-11-20 DIAGNOSIS — I890 Lymphedema, not elsewhere classified: Secondary | ICD-10-CM

## 2016-11-20 NOTE — Progress Notes (Signed)
History of Present Illness  There is no documented history at this time  Assessments & Plan   There are no diagnoses linked to this encounter.    Additional instructions  Subjective:  Patient presents with venous ulcer of the Left lower extremity.    Procedure:  3 layer unna wrap was placed Left lower extremity.   Plan:   Follow up in one week.  

## 2016-11-27 ENCOUNTER — Telehealth (INDEPENDENT_AMBULATORY_CARE_PROVIDER_SITE_OTHER): Payer: Self-pay

## 2016-11-27 NOTE — Telephone Encounter (Signed)
Sherry from [employer] called to let us know that they would have to order zinc oxide unna boots but for now they only have the calamine unna boots but the patient states they are to only use the zinc oxide, per Dr. Delana Meyer we can bring her in tomorrow to have the unna boots placed in our office, I let Judeen Hammans know and she said the patient stated she did not have a ride to come to [place]. Per Dr. Delana Meyer she can come in Tuesday or Wednesday to have unna boots placed or use the calamine. Patient chose to use the calamine boots for now. Judeen Hammans stated they would order the zinc oxide for her legs.

## 2016-12-08 ENCOUNTER — Ambulatory Visit (INDEPENDENT_AMBULATORY_CARE_PROVIDER_SITE_OTHER): Payer: Medicare Other | Admitting: Vascular Surgery

## 2017-01-02 ENCOUNTER — Ambulatory Visit (INDEPENDENT_AMBULATORY_CARE_PROVIDER_SITE_OTHER): Payer: Medicare Other | Admitting: Vascular Surgery

## 2017-01-02 ENCOUNTER — Encounter (INDEPENDENT_AMBULATORY_CARE_PROVIDER_SITE_OTHER): Payer: Self-pay | Admitting: Vascular Surgery

## 2017-01-02 VITALS — BP 118/73 | HR 84 | Resp 16 | Wt 206.0 lb

## 2017-01-02 DIAGNOSIS — I10 Essential (primary) hypertension: Secondary | ICD-10-CM

## 2017-01-02 DIAGNOSIS — M7989 Other specified soft tissue disorders: Secondary | ICD-10-CM

## 2017-01-02 DIAGNOSIS — L97921 Non-pressure chronic ulcer of unspecified part of left lower leg limited to breakdown of skin: Secondary | ICD-10-CM | POA: Diagnosis not present

## 2017-01-02 DIAGNOSIS — E118 Type 2 diabetes mellitus with unspecified complications: Secondary | ICD-10-CM

## 2017-01-02 DIAGNOSIS — I890 Lymphedema, not elsewhere classified: Secondary | ICD-10-CM | POA: Diagnosis not present

## 2017-01-03 NOTE — Assessment & Plan Note (Signed)
Much improved with the Unna boots. We will continue to wrap her legs for the next couple of weeks to try to get the skin completely healed.

## 2017-01-03 NOTE — Progress Notes (Signed)
MRN : [id number]  Debra Butler is a 83 y.o. (07-Oct-1921) female who presents with chief complaint of  Chief Complaint  Patient presents with  . [name] check  .  History of Present Illness: Patient returns today in follow up of left leg swelling and ulceration.  Her leg swelling is markedly improved. She only has a few small superficial scabs on the medial left mid calf. She has no fevers or chills. She is doing well today.  Current Outpatient Prescriptions  Medication Sig Dispense Refill  . acetaminophen (TYLENOL) 325 MG tablet Take 650 mg by mouth every 6 (six) hours as needed.    Marland Kitchen aspirin 81 MG tablet Take 81 mg by mouth daily.    Marland Kitchen diltiazem (DILACOR XR) 180 MG 24 hr capsule Take 180 mg by mouth daily.    . furosemide (LASIX) 20 MG tablet Take 80 mg by mouth 2 (two) times daily.     . hydrocortisone-pramoxine (ANALPRAM HC) 2.5-1 % rectal cream Place 1 application rectally 3 (three) times daily. 30 g 2  . levothyroxine (SYNTHROID, LEVOTHROID) 112 MCG tablet Take 112 mcg by mouth daily before breakfast.    . metFORMIN (GLUCOPHAGE) 500 MG tablet Take 1,000 mg by mouth 2 (two) times daily with a meal.     . nitroGLYCERIN 2.5 MG CR capsule Take 2.5 mg by mouth as needed.    . potassium chloride (KLOR-CON) 20 MEQ packet Take 20 mEq by mouth 2 (two) times daily.    . pregabalin (LYRICA) 100 MG capsule Take 100 mg by mouth 2 (two) times daily.    . traMADol (ULTRAM) 50 MG tablet Take 1 tablet (50 mg total) by mouth every 6 (six) hours as needed. 15 tablet 0  . traMADol (ULTRAM) 50 MG tablet Take 1 tablet (50 mg total) by mouth every 6 (six) hours as needed. 15 tablet 0  . warfarin (COUMADIN) 7.5 MG tablet Take 7.5 mg by mouth one time only at 6 PM.      No current facility-administered medications for this visit.     Past Medical History:  Diagnosis Date  . Blood clot in vein 2013   left leg  . Diabetes mellitus without complication ([place]) 7062  . Hypertension 1988  . IBS  (irritable bowel syndrome)   . Myocardial infarction [facility])     Past Surgical History:  Procedure Laterality Date  . ABDOMINAL HYSTERECTOMY  age 62   partial, still has ovaries and tubes  . APPENDECTOMY    . BACK SURGERY    . BREAST BIOPSY     at age 52, benign  . CARPAL TUNNEL RELEASE Right 2010  . CHOLECYSTECTOMY    . COLON SURGERY  1984  . COLONOSCOPY  2007, 2016   Dr Jamal Collin  . FOOT SURGERY    . FRACTURE SURGERY  2010   hip  . FRACTURE SURGERY  2009   femur  . JOINT REPLACEMENT     x 2  . PACEMAKER PLACEMENT  2013  . TONSILLECTOMY    . UPPER GI ENDOSCOPY  2016   Dr Jamal Collin    Social History Social History  Substance Use Topics  . Smoking status: Never Smoker  . Smokeless tobacco: Never Used  . Alcohol use No    Family History No bleeding or clotting disorders  Allergies  Allergen Reactions  . Phenergan [Promethazine] Other (See Comments)    Altered mental status  . Sulphur [Sulfur]      REVIEW OF  SYSTEMS (Negative unless checked)  Constitutional: [] Weight loss  [] Fever  [] Chills Cardiac: [] Chest pain   [] Chest pressure   [x] Palpitations   [] Shortness of breath when laying flat   [] Shortness of breath at rest   [] Shortness of breath with exertion. Vascular:  [] Pain in legs with walking   [] Pain in legs at rest   [] Pain in legs when laying flat   [] Claudication   [] Pain in feet when walking  [] Pain in feet at rest  [] Pain in feet when laying flat   [x] History of DVT   [] Phlebitis   [x] Swelling in legs   [] Varicose veins   [x] Non-healing ulcers Pulmonary:   [] Uses home oxygen   [] Productive cough   [] Hemoptysis   [] Wheeze  [] COPD   [] Asthma Neurologic:  [] Dizziness  [] Blackouts   [] Seizures   [] History of stroke   [] History of TIA  [] Aphasia   [] Temporary blindness   [] Dysphagia   [] Weakness or numbness in arms   [] Weakness or numbness in legs Musculoskeletal:  [x] Arthritis   [] Joint swelling   [] Joint pain   [] Low back pain Hematologic:  [] Easy bruising  [] Easy  bleeding   [] Hypercoagulable state   [] Anemic   Gastrointestinal:  [] Blood in stool   [] Vomiting blood  [] Gastroesophageal reflux/heartburn   [] Abdominal pain Genitourinary:  [] Chronic kidney disease   [] Difficult urination  [] Frequent urination  [] Burning with urination   [] Hematuria Skin:  [] Rashes   [x] Ulcers   [x] Wounds Psychological:  [] History of anxiety   []  History of major depression.  Physical Examination  BP 118/73   Pulse 84   Resp 16   Wt 93.4 kg (206 lb)   BMI 37.68 kg/m  Gen:  WD/WN, NAD Head: [place]/AT, No temporalis wasting. Ear/Nose/Throat: Hearing grossly intact, nares w/o erythema or drainage, trachea midline Eyes: Conjunctiva clear. Sclera non-icteric Neck: Supple.  No JVD.  Pulmonary:  Good air movement, no use of accessory muscles.  Cardiac: irregularly irregular Vascular:  Vessel Right Left  Radial Palpable Palpable                          PT 1+ Palpable 1+ Palpable  DP Palpable 1+ Palpable    Musculoskeletal: M/S 5/5 throughout.  No deformity or atrophy. Trace to 1+ BLE edema. Neurologic: Sensation grossly intact in extremities.  Symmetrical.  Speech is fluent.  Psychiatric: Judgment intact, Mood & affect appropriate for pt's clinical situation. Dermatologic: small scabs on medial left calf which are clean and without erythema.      Labs No results found for this or any previous visit (from the past 2160 hour(s)).  Radiology No results found.   Assessment/Plan Diabetes (HCC) blood glucose control important in reducing the progression of atherosclerotic disease. Also, involved in wound healing. On appropriate medications.  Hypertension blood pressure control important in reducing the progression of atherosclerotic disease. On appropriate oral medications.   Swelling of limb Much improved with the Unna boots. We will continue to wrap her legs for the next couple of weeks to try to get the skin completely healed.  Lower extremity  ulceration, left, limited to breakdown of skin (HCC) Much improved with the Unna boots. We will continue to wrap her legs for the next couple of weeks to try to get the skin completely healed.    Leotis Pain, MD  01/03/2017 10:35 AM    This note was created with Dragon medical transcription system.  Any errors from dictation are purely unintentional

## 2017-01-03 NOTE — Assessment & Plan Note (Signed)
blood pressure control important in reducing the progression of atherosclerotic disease. On appropriate oral medications.  

## 2017-01-10 ENCOUNTER — Ambulatory Visit (INDEPENDENT_AMBULATORY_CARE_PROVIDER_SITE_OTHER): Payer: Medicare Other | Admitting: Vascular Surgery

## 2017-01-10 ENCOUNTER — Encounter (INDEPENDENT_AMBULATORY_CARE_PROVIDER_SITE_OTHER): Payer: Self-pay

## 2017-01-10 VITALS — BP 112/71 | HR 96 | Resp 16 | Wt 204.0 lb

## 2017-01-10 DIAGNOSIS — M7989 Other specified soft tissue disorders: Secondary | ICD-10-CM

## 2017-01-10 NOTE — Progress Notes (Signed)
History of Present Illness  There is no documented history at this time  Assessments & Plan   There are no diagnoses linked to this encounter.    Additional instructions  Subjective:  Patient presents with venous ulcer of the Left lower extremity.    Procedure:  3 layer unna wrap was placed Left lower extremity.   Plan:   Follow up in one week.  

## 2017-01-17 ENCOUNTER — Encounter (INDEPENDENT_AMBULATORY_CARE_PROVIDER_SITE_OTHER): Payer: Self-pay | Admitting: Vascular Surgery

## 2017-01-17 ENCOUNTER — Ambulatory Visit (INDEPENDENT_AMBULATORY_CARE_PROVIDER_SITE_OTHER): Payer: Medicare Other | Admitting: Vascular Surgery

## 2017-01-17 VITALS — BP 114/78 | HR 67 | Resp 15 | Ht 60.0 in | Wt 204.0 lb

## 2017-01-17 DIAGNOSIS — M7989 Other specified soft tissue disorders: Secondary | ICD-10-CM

## 2017-01-17 NOTE — Progress Notes (Signed)
History of Present Illness  There is no documented history at this time  Assessments & Plan   There are no diagnoses linked to this encounter.    Additional instructions  Subjective:  Patient presents with venous ulcer of the Left lower extremity.    Procedure:  3 layer unna wrap was placed Left lower extremity.   Plan:   Follow up in one week.  

## 2017-01-18 ENCOUNTER — Other Ambulatory Visit (INDEPENDENT_AMBULATORY_CARE_PROVIDER_SITE_OTHER): Payer: Self-pay | Admitting: Vascular Surgery

## 2017-01-26 ENCOUNTER — Ambulatory Visit (INDEPENDENT_AMBULATORY_CARE_PROVIDER_SITE_OTHER): Payer: Medicare Other | Admitting: Vascular Surgery

## 2017-01-26 ENCOUNTER — Encounter (INDEPENDENT_AMBULATORY_CARE_PROVIDER_SITE_OTHER): Payer: Self-pay | Admitting: Vascular Surgery

## 2017-01-26 VITALS — BP 120/70 | HR 90 | Resp 17 | Ht 62.0 in | Wt 205.0 lb

## 2017-01-26 DIAGNOSIS — L97921 Non-pressure chronic ulcer of unspecified part of left lower leg limited to breakdown of skin: Secondary | ICD-10-CM

## 2017-01-26 DIAGNOSIS — I10 Essential (primary) hypertension: Secondary | ICD-10-CM | POA: Diagnosis not present

## 2017-01-26 DIAGNOSIS — M7989 Other specified soft tissue disorders: Secondary | ICD-10-CM | POA: Diagnosis not present

## 2017-01-26 DIAGNOSIS — E118 Type 2 diabetes mellitus with unspecified complications: Secondary | ICD-10-CM | POA: Diagnosis not present

## 2017-01-26 NOTE — Assessment & Plan Note (Signed)
healed 

## 2017-01-26 NOTE — Progress Notes (Signed)
MRN : [id number]  Debra Butler is a 83 y.o. ([birth year]) female who presents with chief complaint of  Chief Complaint  Patient presents with  . Follow-up    Unna boot check  .  History of Present Illness: Patient returns today in follow up of leg swelling and ulceration.  She has now been in [employer] now for a couple of months her legs look great today. Her swelling is basically gone and her skin is now healed.  She has no pain and looks good today.   Current Outpatient Prescriptions  Medication Sig Dispense Refill  . acetaminophen (TYLENOL) 325 MG tablet Take 650 mg by mouth every 6 (six) hours as needed.    Marland Kitchen aspirin 81 MG tablet Take 81 mg by mouth daily.    Marland Kitchen diltiazem (DILACOR XR) 180 MG 24 hr capsule Take 180 mg by mouth daily.    . furosemide (LASIX) 20 MG tablet Take 80 mg by mouth 2 (two) times daily.     . hydrocortisone-pramoxine (ANALPRAM HC) 2.5-1 % rectal cream Place 1 application rectally 3 (three) times daily. 30 g 2  . levothyroxine (SYNTHROID, LEVOTHROID) 112 MCG tablet Take 112 mcg by mouth daily before breakfast.    . metFORMIN (GLUCOPHAGE) 500 MG tablet Take 1,000 mg by mouth 2 (two) times daily with a meal.     . nitroGLYCERIN 2.5 MG CR capsule Take 2.5 mg by mouth as needed.    . potassium chloride (KLOR-CON) 20 MEQ packet Take 20 mEq by mouth 2 (two) times daily.    . pregabalin (LYRICA) 100 MG capsule Take 100 mg by mouth 2 (two) times daily.    . traMADol (ULTRAM) 50 MG tablet Take 1 tablet (50 mg total) by mouth every 6 (six) hours as needed. 15 tablet 0  . traMADol (ULTRAM) 50 MG tablet Take 1 tablet (50 mg total) by mouth every 6 (six) hours as needed. 15 tablet 0  . warfarin (COUMADIN) 7.5 MG tablet Take 7.5 mg by mouth one time only at 6 PM.      No current facility-administered medications for this visit.         Past Medical History:  Diagnosis Date  . Blood clot in vein 2013   left leg  . Diabetes mellitus without  complication ([place]) 0258  . Hypertension 1988  . IBS (irritable bowel syndrome)   . Myocardial infarction [facility])          Past Surgical History:  Procedure Laterality Date  . ABDOMINAL HYSTERECTOMY  age 24   partial, still has ovaries and tubes  . APPENDECTOMY    . BACK SURGERY    . BREAST BIOPSY     at age 40, benign  . CARPAL TUNNEL RELEASE Right 2010  . CHOLECYSTECTOMY    . COLON SURGERY  1984  . COLONOSCOPY  2007, 2016   Dr Jamal Collin  . FOOT SURGERY    . FRACTURE SURGERY  2010   hip  . FRACTURE SURGERY  2009   femur  . JOINT REPLACEMENT     x 2  . PACEMAKER PLACEMENT  2013  . TONSILLECTOMY    . UPPER GI ENDOSCOPY  2016   Dr Jamal Collin    Social History     Social History  Substance Use Topics  . Smoking status: Never Smoker  . Smokeless tobacco: Never Used  . Alcohol use No    Family History No bleeding or clotting disorders  Allergies  Allergen Reactions  . Phenergan [Promethazine] Other (See Comments)    Altered mental status  . Sulphur [Sulfur]      REVIEW OF SYSTEMS (Negative unless checked)  Constitutional: [] Weight loss  [] Fever  [] Chills Cardiac: [] Chest pain   [] Chest pressure   [x] Palpitations   [] Shortness of breath when laying flat   [] Shortness of breath at rest   [] Shortness of breath with exertion. Vascular:  [] Pain in legs with walking   [] Pain in legs at rest   [] Pain in legs when laying flat   [] Claudication   [] Pain in feet when walking  [] Pain in feet at rest  [] Pain in feet when laying flat   [x] History of DVT   [] Phlebitis   [x] Swelling in legs   [] Varicose veins   [x] Non-healing ulcers Pulmonary:   [] Uses home oxygen   [] Productive cough   [] Hemoptysis   [] Wheeze  [] COPD   [] Asthma Neurologic:  [] Dizziness  [] Blackouts   [] Seizures   [] History of stroke   [] History of TIA  [] Aphasia   [] Temporary blindness   [] Dysphagia   [] Weakness or numbness in arms   [] Weakness or numbness in  legs Musculoskeletal:  [x] Arthritis   [] Joint swelling   [] Joint pain   [] Low back pain Hematologic:  [] Easy bruising  [] Easy bleeding   [] Hypercoagulable state   [] Anemic   Gastrointestinal:  [] Blood in stool   [] Vomiting blood  [] Gastroesophageal reflux/heartburn   [] Abdominal pain Genitourinary:  [] Chronic kidney disease   [] Difficult urination  [] Frequent urination  [] Burning with urination   [] Hematuria Skin:  [] Rashes   [x] Ulcers   [x] Wounds Psychological:  [] History of anxiety   []  History of major depression.   Physical Examination  BP 120/70 (BP Location: Right Arm)   Pulse 90   Resp 17   Ht 5\' 2"  (1.575 m)   Wt 93 kg (205 lb)   BMI 37.49 kg/m  Gen:  WD/WN, NAD Looks far younger than stated age. Head: [place]/AT, No temporalis wasting. Ear/Nose/Throat: Hearing grossly intact, nares w/o erythema or drainage, trachea midline Eyes: Conjunctiva clear. Sclera non-icteric Neck: Supple.  No JVD.  Pulmonary:  Good air movement, no use of accessory muscles.  Cardiac: RRR, normal S1, S2 Vascular:  Vessel Right Left  Radial Palpable Palpable                                   Musculoskeletal: M/S 5/5 throughout.  No deformity or atrophy. Trace RLE edema, basically no LLE edema. Neurologic: Sensation grossly intact in extremities.  Symmetrical.  Speech is fluent.  Psychiatric: Judgment intact, Mood & affect appropriate for pt's clinical situation. Dermatologic: No rashes or ulcers noted.  No cellulitis or open wounds. Previous ulcers have healed.        Labs No results found for this or any previous visit (from the past 2160 hour(s)).  Radiology No results found.    Assessment/Plan Diabetes (HCC) blood glucose control important in reducing the progression of atherosclerotic disease. Also, involved in wound healing. On appropriate medications.  Hypertension blood pressure control important in reducing the progression of atherosclerotic disease. On appropriate oral  medications.   Swelling of limb Much improved with the Unna boots. Stop UNNA boots and go to stockings and elevation.  RTC PRN  Lower extremity ulceration, left, limited to breakdown of skin ([place]) healed    Leotis Pain, MD  01/26/2017 4:17 PM    This note  was created with Dragon medical transcription system.  Any errors from dictation are purely unintentional

## 2017-01-30 ENCOUNTER — Telehealth (INDEPENDENT_AMBULATORY_CARE_PROVIDER_SITE_OTHER): Payer: Self-pay | Admitting: Vascular Surgery

## 2017-01-30 NOTE — Telephone Encounter (Signed)
I inform the patient what JD had advise for the right leg

## 2017-01-30 NOTE — Telephone Encounter (Signed)
Just came out of unna boots. States she woke up on Saturday with a sore right foot. States she was told to call if this happens.Please call and advise.

## 2017-04-05 ENCOUNTER — Emergency Department: Payer: Medicare Other

## 2017-04-05 ENCOUNTER — Encounter: Payer: Self-pay | Admitting: *Deleted

## 2017-04-05 ENCOUNTER — Emergency Department
Admission: EM | Admit: 2017-04-05 | Discharge: 2017-04-05 | Disposition: A | Discharge status: Home / Self Care | Payer: Medicare Other | Attending: Emergency Medicine | Admitting: Emergency Medicine

## 2017-04-05 DIAGNOSIS — S2020XA Contusion of thorax, unspecified, initial encounter: Secondary | ICD-10-CM | POA: Diagnosis not present

## 2017-04-05 DIAGNOSIS — Z7901 Long term (current) use of anticoagulants: Secondary | ICD-10-CM | POA: Diagnosis not present

## 2017-04-05 DIAGNOSIS — Y9389 Activity, other specified: Secondary | ICD-10-CM | POA: Insufficient documentation

## 2017-04-05 DIAGNOSIS — Z950 Presence of cardiac pacemaker: Secondary | ICD-10-CM | POA: Diagnosis not present

## 2017-04-05 DIAGNOSIS — S20212A Contusion of left front wall of thorax, initial encounter: Secondary | ICD-10-CM

## 2017-04-05 DIAGNOSIS — I10 Essential (primary) hypertension: Secondary | ICD-10-CM | POA: Diagnosis not present

## 2017-04-05 DIAGNOSIS — W19XXXA Unspecified fall, initial encounter: Secondary | ICD-10-CM

## 2017-04-05 DIAGNOSIS — Y92002 Bathroom of unspecified non-institutional (private) residence single-family (private) house as the place of occurrence of the external cause: Secondary | ICD-10-CM | POA: Insufficient documentation

## 2017-04-05 DIAGNOSIS — S299XXA Unspecified injury of thorax, initial encounter: Secondary | ICD-10-CM | POA: Diagnosis present

## 2017-04-05 DIAGNOSIS — Y998 Other external cause status: Secondary | ICD-10-CM | POA: Insufficient documentation

## 2017-04-05 DIAGNOSIS — Z7982 Long term (current) use of aspirin: Secondary | ICD-10-CM | POA: Diagnosis not present

## 2017-04-05 DIAGNOSIS — S0990XA Unspecified injury of head, initial encounter: Secondary | ICD-10-CM | POA: Insufficient documentation

## 2017-04-05 DIAGNOSIS — Z9049 Acquired absence of other specified parts of digestive tract: Secondary | ICD-10-CM | POA: Insufficient documentation

## 2017-04-05 DIAGNOSIS — E119 Type 2 diabetes mellitus without complications: Secondary | ICD-10-CM | POA: Diagnosis not present

## 2017-04-05 DIAGNOSIS — W010XXA Fall on same level from slipping, tripping and stumbling without subsequent striking against object, initial encounter: Secondary | ICD-10-CM | POA: Insufficient documentation

## 2017-04-05 DIAGNOSIS — Z7984 Long term (current) use of oral hypoglycemic drugs: Secondary | ICD-10-CM | POA: Diagnosis not present

## 2017-04-05 DIAGNOSIS — I252 Old myocardial infarction: Secondary | ICD-10-CM | POA: Diagnosis not present

## 2017-04-05 LAB — CBC WITH DIFFERENTIAL/PLATELET
Basophils Absolute: 0 10*3/uL (ref 0–0.1)
Basophils Relative: 1 %
EOS ABS: 0.1 10*3/uL (ref 0–0.7)
EOS PCT: 2 %
HCT: 39.6 % (ref 35.0–47.0)
HEMOGLOBIN: 12.9 g/dL (ref 12.0–16.0)
LYMPHS ABS: 0.6 10*3/uL — AB (ref 1.0–3.6)
Lymphocytes Relative: 16 %
MCH: 31.6 pg (ref 26.0–34.0)
MCHC: 32.6 g/dL (ref 32.0–36.0)
MCV: 97 fL (ref 80.0–100.0)
MONO ABS: 0.3 10*3/uL (ref 0.2–0.9)
MONOS PCT: 10 %
NEUTROS PCT: 71 %
Neutro Abs: 2.5 10*3/uL (ref 1.4–6.5)
Platelets: 85 10*3/uL — ABNORMAL LOW (ref 150–440)
RBC: 4.09 MIL/uL (ref 3.80–5.20)
RDW: 17.7 % — AB (ref 11.5–14.5)
WBC: 3.5 10*3/uL — ABNORMAL LOW (ref 3.6–11.0)

## 2017-04-05 LAB — BASIC METABOLIC PANEL
Anion gap: 13 (ref 5–15)
BUN: 23 mg/dL — AB (ref 6–20)
CALCIUM: 9.1 mg/dL (ref 8.9–10.3)
CHLORIDE: 97 mmol/L — AB (ref 101–111)
CO2: 30 mmol/L (ref 22–32)
CREATININE: 1.01 mg/dL — AB (ref 0.44–1.00)
GFR calc Af Amer: 54 mL/min — ABNORMAL LOW (ref >60)
GFR calc non Af Amer: 46 mL/min — ABNORMAL LOW (ref >60)
GLUCOSE: 128 mg/dL — AB (ref 65–99)
Potassium: 3.2 mmol/L — ABNORMAL LOW (ref 3.5–5.1)
Sodium: 140 mmol/L (ref 135–145)

## 2017-04-05 LAB — PROTIME-INR
INR: 2.37
PROTHROMBIN TIME: 25.7 s — AB (ref 11.4–15.2)

## 2017-04-05 NOTE — ED Notes (Signed)
Pt alert and oriented X4, active, cooperative, pt in NAD. RR even and unlabored, color WNL.  Pt informed to return if any life threatening symptoms occur.  Discharge and followup instructions reviewed.  

## 2017-04-05 NOTE — ED Notes (Signed)
EDP at bedside  

## 2017-04-05 NOTE — ED Provider Notes (Signed)
[facility] Emergency Department Provider Note  ____________________________________________  Time seen: Approximately 11:37 AM  I have reviewed the triage vital signs and the nursing notes.   HISTORY  Chief Complaint Fall    HPI Debra Butler is a 83 y.o. female reports she was leaning over in her bathroom this morning to turn on a heater when she lost her footing and L on her left side. She complains of some pain at the left shoulder blade as well as in her left hip. She also hit the left side of her head on the floor. She had difficult time getting up so she called EMS who assisted her. Denies any preceding symptoms. She's been compliant with her medications including Lasix and warfarin. Pains are constant, waxing and waning, worse with movement. No alleviating factors     Past Medical History:  Diagnosis Date  . Blood clot in vein 2013   left leg  . Diabetes mellitus without complication ([place]) 0623  . Hypertension 1988  . IBS (irritable bowel syndrome)   . Myocardial infarction [facility])      Patient Active Problem List   Diagnosis Date Noted  . Lower extremity ulceration, left, limited to breakdown of skin ([place]) 11/14/2016  . Chest pain 09/06/2016  . Lower extremity edema 07/31/2016  . Diabetes ([place]) 04/03/2016  . Lymphedema 04/03/2016  . Swelling of limb 04/03/2016  . Hypertension 05/29/1986     Past Surgical History:  Procedure Laterality Date  . ABDOMINAL HYSTERECTOMY  age 63   partial, still has ovaries and tubes  . APPENDECTOMY    . BACK SURGERY    . BREAST BIOPSY     at age 49, benign  . CARPAL TUNNEL RELEASE Right 2010  . CHOLECYSTECTOMY    . COLON SURGERY  1984  . COLONOSCOPY  2007, 2016   Dr Jamal Collin  . FOOT SURGERY    . FRACTURE SURGERY  2010   hip  . FRACTURE SURGERY  2009   femur  . JOINT REPLACEMENT     x 2  . PACEMAKER PLACEMENT  2013  . TONSILLECTOMY    . UPPER GI ENDOSCOPY  2016   Dr Jamal Collin     Prior to  Admission medications   Medication Sig Start Date End Date Taking? Authorizing Provider  acetaminophen (TYLENOL) 325 MG tablet Take 650 mg by mouth every 6 (six) hours as needed.    [provider]  aspirin 81 MG tablet Take 81 mg by mouth daily.    [provider]  diltiazem (DILACOR XR) 180 MG 24 hr capsule Take 180 mg by mouth daily.    [provider]  furosemide (LASIX) 20 MG tablet Take 80 mg by mouth 2 (two) times daily.     [provider]  hydrocortisone-pramoxine (ANALPRAM HC) 2.5-1 % rectal cream Place 1 application rectally 3 (three) times daily. 09/08/14   Christene Lye, MD  levothyroxine (SYNTHROID, LEVOTHROID) 112 MCG tablet Take 112 mcg by mouth daily before breakfast.    [provider]  metFORMIN (GLUCOPHAGE) 500 MG tablet Take 1,000 mg by mouth 2 (two) times daily with a meal.     [provider]  nitroGLYCERIN 2.5 MG CR capsule Take 2.5 mg by mouth as needed.    [provider]  potassium chloride (KLOR-CON) 20 MEQ packet Take 20 mEq by mouth 2 (two) times daily.    [provider]  pregabalin (LYRICA) 100 MG capsule Take 100 mg by mouth  2 (two) times daily.    [provider]  traMADol (ULTRAM) 50 MG tablet Take 1 tablet (50 mg total) by mouth every 6 (six) hours as needed. 06/04/16 06/04/17  Nance Pear, MD  traMADol (ULTRAM) 50 MG tablet Take 1 tablet (50 mg total) by mouth every 6 (six) hours as needed. 09/06/16 09/06/17  Drenda Freeze, MD  warfarin (COUMADIN) 7.5 MG tablet Take 7.5 mg by mouth one time only at 6 PM.     [provider]     Allergies Phenergan [promethazine] and Desha [sulfur]   History reviewed. No pertinent family history.  Social History Social History   Tobacco Use  . Smoking status: Never Smoker  . Smokeless tobacco: Never Used  Substance Use Topics  . Alcohol use: No  . Drug use: No    Review of Systems  Constitutional:   No fever  or chills.  ENT:   No sore throat. No rhinorrhea. Cardiovascular:   No chest pain or syncope. Respiratory:   No dyspnea or cough. Gastrointestinal:   Negative for abdominal pain, vomiting and diarrhea.  Musculoskeletal:   Left hip pain, left scapular pain, left occipital pain All other systems reviewed and are negative except as documented above in ROS and HPI.  ____________________________________________   PHYSICAL EXAM:  VITAL SIGNS: ED Triage Vitals  Enc Vitals Group     BP 04/05/17 0932 (!) 180/90     Pulse Rate 04/05/17 0932 (!) 101     Resp 04/05/17 0932 18     Temp 04/05/17 0932 98.5 F (36.9 C)     Temp Source 04/05/17 0932 Oral     SpO2 04/05/17 0932 98 %     Weight 04/05/17 0928 180 lb (81.6 kg)     Height 04/05/17 0928 5' (1.524 m)     Head Circumference --      Peak Flow --      Pain Score 04/05/17 0927 3     Pain Loc --      Pain Edu? --      Excl. in [place]? --     Vital signs reviewed, nursing assessments reviewed.   Constitutional:   Alert and oriented. Well appearing and in no distress. Eyes:   No scleral icterus.  EOMI. No nystagmus. No conjunctival pallor. PERRL. ENT   Head:   Normocephalic and atraumatic. No laceration or hematoma   Nose:   No congestion/rhinnorhea.    Mouth/Throat:   MMM, no pharyngeal erythema. No peritonsillar mass.    Neck:   No meningismus. Full ROM. Hematological/Lymphatic/Immunilogical:   No cervical lymphadenopathy. Cardiovascular:   RRR. Symmetric bilateral radial and DP pulses.  No murmurs.  Respiratory:   Normal respiratory effort without tachypnea/retractions. Breath sounds are clear and equal bilaterally. No wheezes/rales/rhonchi. Gastrointestinal:   Soft and nontender. Non distended. There is no CVA tenderness.  No rebound, rigidity, or guarding. Genitourinary:   deferred Musculoskeletal:   Normal range of motion in all extremities. No joint effusions.  No lower extremity tenderness.  No edema. Tenderness in  the left subscapular area of the chest wall. No crepitus or instability or point tenderness. Neurologic:   Normal speech and language.  Motor grossly intact. No gross focal neurologic deficits are appreciated.  Skin:    Skin is warm, dry and intact. No rash noted.  No petechiae, purpura, or bullae.  ____________________________________________    LABS (pertinent positives/negatives) (all labs ordered are listed, but only abnormal results are displayed) Labs Reviewed  BASIC  METABOLIC PANEL - Abnormal; Notable for the following components:      Result Value   Potassium 3.2 (*)    Chloride 97 (*)    Glucose, Bld 128 (*)    BUN 23 (*)    Creatinine, Ser 1.01 (*)    GFR calc non Af Amer 46 (*)    GFR calc Af Amer 54 (*)    All other components within normal limits  CBC WITH DIFFERENTIAL/PLATELET - Abnormal; Notable for the following components:   WBC 3.5 (*)    RDW 17.7 (*)    Platelets 85 (*)    Lymphs Abs 0.6 (*)    All other components within normal limits  PROTIME-INR - Abnormal; Notable for the following components:   Prothrombin Time 25.7 (*)    All other components within normal limits   ____________________________________________   EKG    ____________________________________________    RADIOLOGY  Dg Chest 2 View  Result Date: 04/05/2017 CLINICAL DATA:  Pain following fall EXAM: CHEST  2 VIEW COMPARISON:  May 12, 2012 FINDINGS: There is no edema or consolidation. Heart is upper normal in size with pulmonary vascularity within normal limits. Pacemaker lead is attached to the middle cardiac vein. There is aortic atherosclerosis. There is degenerative change in the thoracic spine. Bones are osteoporotic. There is evidence of an old fracture, left humeral metaphysis, with remodeling. There is degenerative change in each shoulder. There is bilateral carotid artery calcification. IMPRESSION: No edema or consolidation. Heart upper normal in size. Pacemaker lead  attached to middle cardiac vein. There is aortic atherosclerosis. There is also bilateral carotid artery calcification. Aortic Atherosclerosis (ICD10-I70.0). Electronically Signed   By: Lowella Grip III M.D.   On: 04/05/2017 10:39   Ct Head Wo Contrast  Result Date: 04/05/2017 CLINICAL DATA:  83 year old female status post fall at home striking head. On Coumadin. Nausea. EXAM: CT HEAD WITHOUT CONTRAST TECHNIQUE: Contiguous axial images were obtained from the base of the skull through the vertex without intravenous contrast. COMPARISON:  Head CTs 06/16/2016 and earlier. FINDINGS: Brain: Cerebral volume remains normal for age. Stable gray-white matter differentiation throughout the brain. Stable small dural calcification or calcified meningioma along the left anterior operculum (series 3, image 14). No intracranial mass effect. No midline shift, ventriculomegaly, mass effect, intracranial hemorrhage or evidence of cortically based acute infarction. Vascular: Calcified atherosclerosis at the skull base. No suspicious intracranial vascular hyperdensity. Skull: No skull fracture identified. No acute osseous abnormality identified. Sinuses/Orbits: Visualized paranasal sinuses and mastoids are stable and well pneumatized. Other: No definite acute scalp hematoma. Soft tissue stranding along the left forehead might be the residual of the sizable forehead hematoma seen on 06/16/2016. No acute orbit soft tissue findings. IMPRESSION: No acute intracranial abnormality or acute traumatic injury identified. Stable non contrast CT appearance of the brain. Electronically Signed   By: Genevie Ann M.D.   On: 04/05/2017 10:17    ____________________________________________   PROCEDURES Procedures  ____________________________________________   DIFFERENTIAL DIAGNOSIS  Hip fracture, rib fracture, pneumothorax, intracranial hemorrhage  CLINICAL IMPRESSION / ASSESSMENT AND PLAN / ED COURSE  Pertinent labs & imaging  results that were available during my care of the patient were reviewed by me and considered in my medical decision making (see chart for details).   Patient well appearing no acute distress, mechanical fall at home. Due to her medications including warfarin, check labs which are unremarkable. She is suitable for outpatient follow-up, likely musculoskeletal pain with some contusions. No evidence at this  time of any significant traumatic injury      ____________________________________________   FINAL CLINICAL IMPRESSION(S) / ED DIAGNOSES    Final diagnoses:  Fall, initial encounter  Chest wall contusion, left, initial encounter      This SmartLink is deprecated. Use AVSMEDLIST instead to display the medication list for a patient.   Portions of this note were generated with dragon dictation software. Dictation errors may occur despite best attempts at proofreading.    Carrie Mew, MD 04/05/17 1143

## 2017-04-05 NOTE — ED Triage Notes (Signed)
Pt arrives via EMS from home, states she bent over to turn her heter on and her walker rolled, pt fell and hit her head, pt on coumadin, arrives awake and alert in no acute distress, states nausea upon arrival

## 2017-04-21 ENCOUNTER — Emergency Department
Admission: EM | Admit: 2017-04-21 | Discharge: 2017-04-21 | Disposition: A | Discharge status: Home / Self Care | Payer: Medicare Other | Attending: Emergency Medicine | Admitting: Emergency Medicine

## 2017-04-21 ENCOUNTER — Other Ambulatory Visit: Payer: Self-pay

## 2017-04-21 ENCOUNTER — Emergency Department: Payer: Medicare Other

## 2017-04-21 ENCOUNTER — Encounter: Payer: Self-pay | Admitting: Emergency Medicine

## 2017-04-21 DIAGNOSIS — I252 Old myocardial infarction: Secondary | ICD-10-CM | POA: Insufficient documentation

## 2017-04-21 DIAGNOSIS — Z7901 Long term (current) use of anticoagulants: Secondary | ICD-10-CM | POA: Diagnosis not present

## 2017-04-21 DIAGNOSIS — I10 Essential (primary) hypertension: Secondary | ICD-10-CM | POA: Diagnosis not present

## 2017-04-21 DIAGNOSIS — E119 Type 2 diabetes mellitus without complications: Secondary | ICD-10-CM | POA: Insufficient documentation

## 2017-04-21 DIAGNOSIS — M25552 Pain in left hip: Secondary | ICD-10-CM | POA: Insufficient documentation

## 2017-04-21 DIAGNOSIS — Z950 Presence of cardiac pacemaker: Secondary | ICD-10-CM | POA: Diagnosis not present

## 2017-04-21 DIAGNOSIS — Z79899 Other long term (current) drug therapy: Secondary | ICD-10-CM | POA: Diagnosis not present

## 2017-04-21 DIAGNOSIS — Z7984 Long term (current) use of oral hypoglycemic drugs: Secondary | ICD-10-CM | POA: Insufficient documentation

## 2017-04-21 DIAGNOSIS — Z7982 Long term (current) use of aspirin: Secondary | ICD-10-CM | POA: Insufficient documentation

## 2017-04-21 MED ORDER — TRAMADOL HCL 50 MG PO TABS: 50.0000 mg | ORAL_TABLET | Freq: Four times a day (QID) | ORAL | 0 refills | Status: DC | PRN

## 2017-04-21 NOTE — ED Triage Notes (Signed)
Pt fell two weeks ago - was checked but they never xray'd her hip. Pt here for increased pain. Took a total of 3 tylenol xs throughout the day

## 2017-04-21 NOTE — ED Provider Notes (Signed)
[facility] Emergency Department Provider Note  ____________________________________________   I have reviewed the triage vital signs and the nursing notes.   HISTORY  Chief Complaint Hip Pain (left)   History limited by: Not Limited   HPI Debra Butler is a 83 y.o. female who presents to the emergency department today because of hip pain.   LOCATION:left hip DURATION:2 weeks TIMING: constant SEVERITY: severe QUALITY: sharp CONTEXT: patient had a fall two weeks ago onto her left side. States she has been having left hip pain since that time. It has been getting worse. Has a history of a fracture on the left side many years ago s/p surgical repair. MODIFYING FACTORS: better with pressure, worse with walking ASSOCIATED SYMPTOMS: radiation of pain down her leg  Per medical record review patient has a history of left hip fracture  Past Medical History:  Diagnosis Date  . Blood clot in vein 2013   left leg  . Diabetes mellitus without complication ([place]) 2831  . Hypertension 1988  . IBS (irritable bowel syndrome)   . Myocardial infarction [facility])     Patient Active Problem List   Diagnosis Date Noted  . Lower extremity ulceration, left, limited to breakdown of skin ([place]) 11/14/2016  . Chest pain 09/06/2016  . Lower extremity edema 07/31/2016  . Diabetes ([place]) 04/03/2016  . Lymphedema 04/03/2016  . Swelling of limb 04/03/2016  . Hypertension 05/29/1986    Past Surgical History:  Procedure Laterality Date  . ABDOMINAL HYSTERECTOMY  age 50   partial, still has ovaries and tubes  . APPENDECTOMY    . BACK SURGERY    . BREAST BIOPSY     at age 66, benign  . CARPAL TUNNEL RELEASE Right 2010  . CHOLECYSTECTOMY    . COLON SURGERY  1984  . COLONOSCOPY  2007, 2016   Dr Jamal Collin  . FOOT SURGERY    . FRACTURE SURGERY  2010   hip  . FRACTURE SURGERY  2009   femur  . JOINT REPLACEMENT     x 2  . PACEMAKER PLACEMENT  2013  . TONSILLECTOMY     . UPPER GI ENDOSCOPY  2016   Dr Jamal Collin    Prior to Admission medications   Medication Sig Start Date End Date Taking? Authorizing Provider  acetaminophen (TYLENOL) 325 MG tablet Take 650 mg by mouth every 6 (six) hours as needed.    [provider]  aspirin 81 MG tablet Take 81 mg by mouth daily.    [provider]  diltiazem (DILACOR XR) 180 MG 24 hr capsule Take 180 mg by mouth daily.    [provider]  furosemide (LASIX) 20 MG tablet Take 80 mg by mouth 2 (two) times daily.     [provider]  hydrocortisone-pramoxine (ANALPRAM HC) 2.5-1 % rectal cream Place 1 application rectally 3 (three) times daily. 09/08/14   Christene Lye, MD  levothyroxine (SYNTHROID, LEVOTHROID) 112 MCG tablet Take 112 mcg by mouth daily before breakfast.    [provider]  metFORMIN (GLUCOPHAGE) 500 MG tablet Take 1,000 mg by mouth 2 (two) times daily with a meal.     [provider]  nitroGLYCERIN 2.5 MG CR capsule Take 2.5 mg by mouth as needed.    [provider]  potassium chloride (KLOR-CON) 20 MEQ packet Take 20 mEq by mouth 2 (two) times daily.    [provider]  pregabalin (LYRICA) 100 MG capsule Take 100 mg by mouth 2 (  two) times daily.    [provider]  traMADol (ULTRAM) 50 MG tablet Take 1 tablet (50 mg total) by mouth every 6 (six) hours as needed. 06/04/16 06/04/17  Nance Pear, MD  traMADol (ULTRAM) 50 MG tablet Take 1 tablet (50 mg total) by mouth every 6 (six) hours as needed. 09/06/16 09/06/17  Drenda Freeze, MD  warfarin (COUMADIN) 7.5 MG tablet Take 7.5 mg by mouth one time only at 6 PM.     [provider]    Allergies Phenergan [promethazine] and [place] [sulfur]  History reviewed. No pertinent family history.  Social History Social History   Tobacco Use  . Smoking status: Never Smoker  . Smokeless tobacco: Never Used  Substance Use Topics  . Alcohol use: No  . Drug use: No     Review of Systems Constitutional: No fever/chills Eyes: No visual changes. ENT: No sore throat. Cardiovascular: Denies chest pain. Respiratory: Denies shortness of breath. Gastrointestinal: No abdominal pain.  No nausea, no vomiting.  No diarrhea.   Genitourinary: Negative for dysuria. Musculoskeletal: Positive for left hip pain. Skin: Negative for rash. Neurological: Negative for headaches, focal weakness or numbness.  ____________________________________________   PHYSICAL EXAM:  VITAL SIGNS: ED Triage Vitals [04/21/17 1503]  Enc Vitals Group     BP 120/80     Pulse 94     Resp 28     Temp      Temp src      SpO2      Weight 180 lb (81.6 kg)     Height 5' (1.524 m)     Head Circumference      Peak Flow      Pain Score 0   Constitutional: Alert and oriented. Well appearing and in no distress. Eyes: Conjunctivae are normal.  ENT   Head: Normocephalic and atraumatic.   Nose: No congestion/rhinnorhea.   Mouth/Throat: Mucous membranes are moist.   Neck: No stridor. Hematological/Lymphatic/Immunilogical: No cervical lymphadenopathy. Cardiovascular: Normal rate, regular rhythm.  No murmurs, rubs, or gallops.  Respiratory: Normal respiratory effort without tachypnea nor retractions. Breath sounds are clear and equal bilaterally. No wheezes/rales/rhonchi. Gastrointestinal: Soft and non tender. No rebound. No guarding.  Genitourinary: Deferred Musculoskeletal: Normal range of motion in all extremities. No lower extremity edema. Neurologic:  Normal speech and language. No gross focal neurologic deficits are appreciated.  Skin:  Skin is warm, dry and intact. No rash noted. Psychiatric: Mood and affect are normal. Speech and behavior are normal. Patient exhibits appropriate insight and judgment.  ____________________________________________    LABS (pertinent  positives/negatives)  None  ____________________________________________   EKG  None  ____________________________________________    RADIOLOGY  Left hip S/p surgery, no acute osseous injury  I, Truth Wolaver, personally viewed and evaluated these images (plain radiographs) as part of my medical decision making. ____________________________________________   PROCEDURES  Procedures  ____________________________________________   INITIAL IMPRESSION / ASSESSMENT AND PLAN / ED COURSE  Pertinent labs & imaging results that were available during my care of the patient were reviewed by me and considered in my medical decision making (see chart for details).  Patient presents to the emergency department today because of concerns for left hip pain.  Differential would include fracture, dislocation, septic arthritis, sciatica, bursitis amongst other etiologies.  X-rays did not show any acute process fracture or dislocation.  At this time I think bursitis unlikely.  Discussed with patient importance of following up with orthopedic surgeon.  Will give patient a brief prescription for  tramadol to help with the pain.  I did check the [facility] drug database prior to prescription.  Discussed x-ray findings and plan with patient and caregiver.    ____________________________________________   FINAL CLINICAL IMPRESSION(S) / ED DIAGNOSES  Final diagnoses:  Left hip pain     Note: This dictation was prepared with Dragon dictation. Any transcriptional errors that result from this process are unintentional     Nance Pear, MD 04/21/17 1622

## 2017-04-21 NOTE — Discharge Instructions (Signed)
Please seek medical attention for any high fevers, chest pain, shortness of breath, change in behavior, persistent vomiting, bloody stool or any other new or concerning symptoms.  

## 2017-06-05 ENCOUNTER — Ambulatory Visit (INDEPENDENT_AMBULATORY_CARE_PROVIDER_SITE_OTHER): Payer: Medicare Other | Admitting: Vascular Surgery

## 2017-06-05 ENCOUNTER — Encounter (INDEPENDENT_AMBULATORY_CARE_PROVIDER_SITE_OTHER): Payer: Self-pay | Admitting: Vascular Surgery

## 2017-06-05 VITALS — BP 120/74 | HR 78 | Resp 16 | Ht 63.0 in | Wt 182.0 lb

## 2017-06-05 DIAGNOSIS — I10 Essential (primary) hypertension: Secondary | ICD-10-CM

## 2017-06-05 DIAGNOSIS — E118 Type 2 diabetes mellitus with unspecified complications: Secondary | ICD-10-CM | POA: Diagnosis not present

## 2017-06-05 DIAGNOSIS — L97921 Non-pressure chronic ulcer of unspecified part of left lower leg limited to breakdown of skin: Secondary | ICD-10-CM | POA: Diagnosis not present

## 2017-06-05 NOTE — Progress Notes (Signed)
MRN : [id number]  Debra Butler is a 84 y.o. ([birth year]) female who presents with chief complaint of  Chief Complaint  Patient presents with  . Follow-up    Right leg soreness, thinks it is infected  .  History of Present Illness: Patient returns today in follow up of a new ulceration on the left anterior calf.  The patient has a long-standing history of leg swelling and lymphedema that had been under very good control until recently her nurses aide was putting on her compression stockings and created a skin tear on the left anterior calf just above the shin.  This is in an area which has been very hard to heal and has not healed over the past 3-4 weeks.  Mild surrounding redness without significant drainage.  This is associated with some worsening of  Current Outpatient Prescriptions  Medication Sig Dispense Refill  . acetaminophen (TYLENOL) 325 MG tablet Take 650 mg by mouth every 6 (six) hours as needed.    Marland Kitchen aspirin 81 MG tablet Take 81 mg by mouth daily.    Marland Kitchen diltiazem (DILACOR XR) 180 MG 24 hr capsule Take 180 mg by mouth daily.    . furosemide (LASIX) 20 MG tablet Take 80 mg by mouth 2 (two) times daily.     . hydrocortisone-pramoxine (ANALPRAM HC) 2.5-1 % rectal cream Place 1 application rectally 3 (three) times daily. 30 g 2  . levothyroxine (SYNTHROID, LEVOTHROID) 112 MCG tablet Take 112 mcg by mouth daily before breakfast.    . metFORMIN (GLUCOPHAGE) 500 MG tablet Take 1,000 mg by mouth 2 (two) times daily with a meal.     . nitroGLYCERIN 2.5 MG CR capsule Take 2.5 mg by mouth as needed.    . potassium chloride (KLOR-CON) 20 MEQ packet Take 20 mEq by mouth 2 (two) times daily.    . pregabalin (LYRICA) 100 MG capsule Take 100 mg by mouth 2 (two) times daily.    . traMADol (ULTRAM) 50 MG tablet Take 1 tablet (50 mg total) by mouth every 6 (six) hours as needed. 15 tablet 0  . traMADol (ULTRAM) 50 MG tablet Take 1 tablet (50 mg total) by mouth every 6 (six)  hours as needed. 15 tablet 0  . warfarin (COUMADIN) 7.5 MG tablet Take 7.5 mg by mouth one time only at 6 PM.      No current facility-administered medications for this visit.         Past Medical History:  Diagnosis Date  . Blood clot in vein 2013   left leg  . Diabetes mellitus without complication ([place]) 2585  . Hypertension 1988  . IBS (irritable bowel syndrome)   . Myocardial infarction [facility])          Past Surgical History:  Procedure Laterality Date  . ABDOMINAL HYSTERECTOMY  age 37   partial, still has ovaries and tubes  . APPENDECTOMY    . BACK SURGERY    . BREAST BIOPSY     at age 45, benign  . CARPAL TUNNEL RELEASE Right 2010  . CHOLECYSTECTOMY    . COLON SURGERY  1984  . COLONOSCOPY  2007, 2016   Dr Jamal Collin  . FOOT SURGERY    . FRACTURE SURGERY  2010   hip  . FRACTURE SURGERY  2009   femur  . JOINT REPLACEMENT     x 2  . PACEMAKER PLACEMENT  2013  . TONSILLECTOMY    . UPPER GI ENDOSCOPY  2016  Dr Jamal Collin    Social History     Social History  Substance Use Topics  . Smoking status: Never Smoker  . Smokeless tobacco: Never Used  . Alcohol use No    Family History No bleeding or clotting disorders       Allergies  Allergen Reactions  . Phenergan [Promethazine] Other (See Comments)    Altered mental status  . Sulphur [Sulfur]      REVIEW OF SYSTEMS(Negative unless checked)  Constitutional: '[]'$ Weight loss'[]'$ Fever'[]'$ Chills Cardiac:'[]'$ Chest pain'[]'$ Chest pressure'[x]'$ Palpitations '[]'$ Shortness of breath when laying flat '[]'$ Shortness of breath at rest '[]'$ Shortness of breath with exertion. Vascular: '[]'$ Pain in legs with walking'[]'$ Pain in legsat rest'[]'$ Pain in legs when laying flat '[]'$ Claudication '[]'$ Pain in feet when walking '[]'$ Pain in feet at rest '[]'$ Pain in feet when laying flat '[x]'$ History of DVT '[]'$ Phlebitis '[x]'$ Swelling in legs '[]'$ Varicose veins '[x]'$ Non-healing  ulcers Pulmonary: '[]'$ Uses home oxygen '[]'$ Productive cough'[]'$ Hemoptysis '[]'$ Wheeze '[]'$ COPD '[]'$ Asthma Neurologic: '[]'$ Dizziness '[]'$ Blackouts '[]'$ Seizures '[]'$ History of stroke '[]'$ History of TIA'[]'$ Aphasia '[]'$ Temporary blindness'[]'$ Dysphagia '[]'$ Weaknessor numbness in arms '[]'$ Weakness or numbnessin legs Musculoskeletal: '[x]'$ Arthritis '[]'$ Joint swelling '[]'$ Joint pain '[]'$ Low back pain Hematologic:'[]'$ Easy bruising'[]'$ Easy bleeding '[]'$ Hypercoagulable state '[]'$ Anemic  Gastrointestinal:'[]'$ Blood in stool'[]'$ Vomiting blood'[]'$ Gastroesophageal reflux/heartburn'[]'$ Abdominal pain Genitourinary: '[]'$ Chronic kidney disease '[]'$ Difficulturination '[]'$ Frequenturination '[]'$ Burning with urination'[]'$ Hematuria Skin: '[]'$ Rashes '[x]'$ Ulcers '[x]'$ Wounds Psychological: '[]'$ History of anxiety'[]'$ History of major depression.      Physical Examination  BP 120/74 (BP Location: Left Arm)   Pulse 78   Resp 16   Ht '5\' 3"'$  (1.6 m)   Wt 82.6 kg (182 lb)   BMI 32.24 kg/m  Gen:  WD/WN, NAD.  Appears younger than stated age Head: [place]/AT, No temporalis wasting. Ear/Nose/Throat: Hearing grossly intact, nares w/o erythema or drainage, trachea midline Eyes: Conjunctiva clear. Sclera non-icteric Neck: Supple.  No JVD.  Pulmonary:  Good air movement, no use of accessory muscles.  Cardiac: Irregular Vascular:  Vessel Right Left  Radial Palpable Palpable                                    Musculoskeletal: M/S 5/5 throughout.  No deformity or atrophy.  1+ right lower extremity edema, 1-2+ left lower extremity edema. Neurologic: Sensation grossly intact in extremities.  Symmetrical.  Speech is fluent.  Psychiatric: Judgment intact, Mood & affect appropriate for pt's clinical situation. Dermatologic: 1 cm circular ulceration on the anterior left calf area       Labs Recent Results (from the past 2160 hour(s))  Basic metabolic panel     Status: Abnormal   Collection Time: 04/05/17  9:45 AM    Result Value Ref Range   Sodium 140 135 - 145 mmol/L   Potassium 3.2 (L) 3.5 - 5.1 mmol/L   Chloride 97 (L) 101 - 111 mmol/L   CO2 30 22 - 32 mmol/L   Glucose, Bld 128 (H) 65 - 99 mg/dL   BUN 23 (H) 6 - 20 mg/dL   Creatinine, Ser 1.01 (H) 0.44 - 1.00 mg/dL   Calcium 9.1 8.9 - 10.3 mg/dL   GFR calc non Af Amer 46 (L) >60 mL/min   GFR calc Af Amer 54 (L) >60 mL/min    Comment: (NOTE) The eGFR has been calculated using the CKD EPI equation. This calculation has not been validated in all clinical situations. eGFR's persistently <60 mL/min signify possible Chronic Kidney Disease.    Anion gap 13 5 - 15  CBC with Differential     Status: Abnormal   Collection Time: 04/05/17  9:45  AM  Result Value Ref Range   WBC 3.5 (L) 3.6 - 11.0 K/uL   RBC 4.09 3.80 - 5.20 MIL/uL   Hemoglobin 12.9 12.0 - 16.0 g/dL   HCT 39.6 35.0 - 47.0 %   MCV 97.0 80.0 - 100.0 fL   MCH 31.6 26.0 - 34.0 pg   MCHC 32.6 32.0 - 36.0 g/dL   RDW 17.7 (H) 11.5 - 14.5 %   Platelets 85 (L) 150 - 440 K/uL   Neutrophils Relative % 71 %   Neutro Abs 2.5 1.4 - 6.5 K/uL   Lymphocytes Relative 16 %   Lymphs Abs 0.6 (L) 1.0 - 3.6 K/uL   Monocytes Relative 10 %   Monocytes Absolute 0.3 0.2 - 0.9 K/uL   Eosinophils Relative 2 %   Eosinophils Absolute 0.1 0 - 0.7 K/uL   Basophils Relative 1 %   Basophils Absolute 0.0 0 - 0.1 K/uL  Protime-INR     Status: Abnormal   Collection Time: 04/05/17  9:45 AM  Result Value Ref Range   Prothrombin Time 25.7 (H) 11.4 - 15.2 seconds   INR 2.37     Radiology No results found.    Assessment/Plan Diabetes (HCC) blood glucose control important in reducing the progression of atherosclerotic disease. Also, involved in wound healing. On appropriate medications.  Hypertension blood pressure control important in reducing the progression of atherosclerotic disease. On appropriate oral medications.   Lower extremity ulceration, left, limited to breakdown of skin ([place]) Although the  ulcer is small, with associated swelling and previous history of skin issues at [employer] today.  I have discussed doing this for 2-3 weeks and hopefully will get the area healed by then she can get back in her compression stockings.    Leotis Pain, MD  06/05/2017 4:04 PM    This note was created with Dragon medical transcription system.  Any errors from dictation are purely unintentional

## 2017-06-05 NOTE — Assessment & Plan Note (Signed)
Although the ulcer is small, with associated swelling and previous history of skin issues at [employer] today.  I have discussed doing this for 2-3 weeks and hopefully will get the area healed by then she can get back in her compression stockings.

## 2017-06-12 ENCOUNTER — Ambulatory Visit (INDEPENDENT_AMBULATORY_CARE_PROVIDER_SITE_OTHER): Payer: Medicare Other | Admitting: Vascular Surgery

## 2017-06-12 ENCOUNTER — Encounter (INDEPENDENT_AMBULATORY_CARE_PROVIDER_SITE_OTHER): Payer: Self-pay

## 2017-06-12 VITALS — BP 133/81 | HR 91 | Resp 16 | Wt 198.4 lb

## 2017-06-12 DIAGNOSIS — I890 Lymphedema, not elsewhere classified: Secondary | ICD-10-CM

## 2017-06-12 NOTE — Progress Notes (Signed)
History of Present Illness  There is no documented history at this time  Assessments & Plan   There are no diagnoses linked to this encounter.    Additional instructions  Subjective:  Patient presents with venous ulcer of the Left lower extremity.    Procedure:  3 layer unna wrap was placed Left lower extremity.   Plan:   Follow up in one week.  

## 2017-06-19 ENCOUNTER — Encounter (INDEPENDENT_AMBULATORY_CARE_PROVIDER_SITE_OTHER): Payer: Self-pay

## 2017-06-19 ENCOUNTER — Ambulatory Visit (INDEPENDENT_AMBULATORY_CARE_PROVIDER_SITE_OTHER): Payer: Medicare Other | Admitting: Vascular Surgery

## 2017-06-19 VITALS — BP 130/75 | HR 66 | Resp 16 | Wt 198.0 lb

## 2017-06-19 DIAGNOSIS — I890 Lymphedema, not elsewhere classified: Secondary | ICD-10-CM | POA: Diagnosis not present

## 2017-06-19 NOTE — Progress Notes (Signed)
History of Present Illness  There is no documented history at this time  Assessments & Plan   There are no diagnoses linked to this encounter.    Additional instructions  Subjective:  Patient presents with venous ulcer of the Left lower extremity.    Procedure:  3 layer unna wrap was placed Left lower extremity.   Plan:   Follow up in one week.  

## 2017-06-26 ENCOUNTER — Encounter (INDEPENDENT_AMBULATORY_CARE_PROVIDER_SITE_OTHER): Payer: Self-pay | Admitting: Vascular Surgery

## 2017-06-26 ENCOUNTER — Ambulatory Visit (INDEPENDENT_AMBULATORY_CARE_PROVIDER_SITE_OTHER): Payer: Medicare Other | Admitting: Vascular Surgery

## 2017-06-26 VITALS — BP 116/60 | HR 72 | Resp 18 | Wt 198.0 lb

## 2017-06-26 DIAGNOSIS — L97921 Non-pressure chronic ulcer of unspecified part of left lower leg limited to breakdown of skin: Secondary | ICD-10-CM

## 2017-06-26 DIAGNOSIS — I10 Essential (primary) hypertension: Secondary | ICD-10-CM

## 2017-06-26 DIAGNOSIS — E118 Type 2 diabetes mellitus with unspecified complications: Secondary | ICD-10-CM | POA: Diagnosis not present

## 2017-06-26 DIAGNOSIS — M7989 Other specified soft tissue disorders: Secondary | ICD-10-CM | POA: Diagnosis not present

## 2017-06-26 NOTE — Assessment & Plan Note (Signed)
Better.  Will come out of the Unna boots and go to compression stockings daily.  Return as needed.

## 2017-06-26 NOTE — Progress Notes (Signed)
MRN : [id number]  Debra Butler is a 84 y.o. (Jan 29, 1922) female who presents with chief complaint of  Chief Complaint  Patient presents with  . Follow-up    unna check  .  History of Present Illness: Patient returns today in follow up of leg swelling and skin breakdown.  Her wounds have healed and her swelling is better after 3 weeks in Unna boots.  She has no fever or chills.  No complaints today  Current Outpatient Prescriptions  Medication Sig Dispense Refill  . acetaminophen (TYLENOL) 325 MG tablet Take 650 mg by mouth every 6 (six) hours as needed.    Marland Kitchen aspirin 81 MG tablet Take 81 mg by mouth daily.    Marland Kitchen diltiazem (DILACOR XR) 180 MG 24 hr capsule Take 180 mg by mouth daily.    . furosemide (LASIX) 20 MG tablet Take 80 mg by mouth 2 (two) times daily.     . hydrocortisone-pramoxine (ANALPRAM HC) 2.5-1 % rectal cream Place 1 application rectally 3 (three) times daily. 30 g 2  . levothyroxine (SYNTHROID, LEVOTHROID) 112 MCG tablet Take 112 mcg by mouth daily before breakfast.    . metFORMIN (GLUCOPHAGE) 500 MG tablet Take 1,000 mg by mouth 2 (two) times daily with a meal.     . nitroGLYCERIN 2.5 MG CR capsule Take 2.5 mg by mouth as needed.    . potassium chloride (KLOR-CON) 20 MEQ packet Take 20 mEq by mouth 2 (two) times daily.    . pregabalin (LYRICA) 100 MG capsule Take 100 mg by mouth 2 (two) times daily.    . traMADol (ULTRAM) 50 MG tablet Take 1 tablet (50 mg total) by mouth every 6 (six) hours as needed. 15 tablet 0  . traMADol (ULTRAM) 50 MG tablet Take 1 tablet (50 mg total) by mouth every 6 (six) hours as needed. 15 tablet 0  . warfarin (COUMADIN) 7.5 MG tablet Take 7.5 mg by mouth one time only at 6 PM.      No current facility-administered medications for this visit.         Past Medical History:  Diagnosis Date  . Blood clot in vein 2013   left leg  . Diabetes mellitus without complication ([place]) 3875  . Hypertension 1988  . IBS  (irritable bowel syndrome)   . Myocardial infarction [facility])          Past Surgical History:  Procedure Laterality Date  . ABDOMINAL HYSTERECTOMY  age 11   partial, still has ovaries and tubes  . APPENDECTOMY    . BACK SURGERY    . BREAST BIOPSY     at age 37, benign  . CARPAL TUNNEL RELEASE Right 2010  . CHOLECYSTECTOMY    . COLON SURGERY  1984  . COLONOSCOPY  2007, 2016   Dr Jamal Collin  . FOOT SURGERY    . FRACTURE SURGERY  2010   hip  . FRACTURE SURGERY  2009   femur  . JOINT REPLACEMENT     x 2  . PACEMAKER PLACEMENT  2013  . TONSILLECTOMY    . UPPER GI ENDOSCOPY  2016   Dr Jamal Collin    Social History     Social History  Substance Use Topics  . Smoking status: Never Smoker  . Smokeless tobacco: Never Used  . Alcohol use No    Family History No bleeding or clotting disorders       Allergies  Allergen Reactions  . Phenergan [Promethazine] Other (See Comments)  Altered mental status  . Sulphur [Sulfur]      REVIEW OF SYSTEMS(Negative unless checked)  Constitutional: _0 Weight loss_1 Fever_2 Chills Cardiac:_3 Chest pain_4 Chest pressure_5 Palpitations _6 Shortness of breath when laying flat _7 Shortness of breath at rest _8 Shortness of breath with exertion. Vascular: _9 Pain in legs with walking_10 Pain in legsat rest_11 Pain in legs when laying flat _12 Claudication _13 Pain in feet when walking _14 Pain in feet at rest _15 Pain in feet when laying flat _16 History of DVT _17 Phlebitis _18 Swelling in legs _19 Varicose veins _20 Non-healing ulcers Pulmonary: _21 Uses home oxygen _22 Productive cough_23 Hemoptysis _24 Wheeze _25 COPD _26 Asthma Neurologic: _27 Dizziness _28 Blackouts _29 Seizures _30 History of stroke _31 History of TIA_32 Aphasia _33 Temporary blindness_34 Dysphagia _35 Weaknessor numbness in arms _36 Weakness or numbnessin legs Musculoskeletal: _37 Arthritis _38 Joint swelling  _39 Joint pain _40 Low back pain Hematologic:_41 Easy bruising_42 Easy bleeding _43 Hypercoagulable state _44 Anemic  Gastrointestinal:_45 Blood in stool_46 Vomiting blood_47 Gastroesophageal reflux/heartburn_48 Abdominal pain Genitourinary: _49 Chronic kidney disease _50 Difficulturination _51 Frequenturination _52 Burning with urination_53 Hematuria Skin: _54 Rashes _55 Ulcers _56 Wounds Psychological: _57 History of anxiety_58 History of major depression.       Physical Examination  BP 116/60 (BP Location: Left Arm)   Pulse 72   Resp 18   Wt 198 lb (89.8 kg)   BMI 35.07 kg/m  Gen:  WD/WN, NAD Head: [place]/AT, No temporalis wasting. Ear/Nose/Throat: Hearing grossly intact, nares w/o erythema or drainage, trachea midline Eyes: Conjunctiva clear. Sclera non-icteric Neck: Supple.  No JVD.  Pulmonary:  Good air movement, no use of accessory muscles.  Cardiac: RRR, normal S1, S2 Vascular:  Vessel Right Left  Radial Palpable Palpable                                    Musculoskeletal: 2+ right lower extremity edema, 1-2+ left lower extremity edema.  Skin ulcerations have healed Neurologic: Sensation grossly intact in extremities.  Symmetrical.  Speech is fluent.  Psychiatric: Judgment intact, Mood & affect appropriate for pt's clinical situation. Dermatologic: No rashes or ulcers noted.  No cellulitis or open wounds.       Labs Recent Results (from the past 2160 hour(s))  Basic metabolic panel     Status: Abnormal   Collection Time: 04/05/17  9:45 AM  Result Value Ref Range   Sodium 140 135 - 145 mmol/L   Potassium 3.2 (L) 3.5 - 5.1 mmol/L   Chloride 97 (L) 101 - 111 mmol/L   CO2 30 22 - 32 mmol/L   Glucose, Bld 128 (H) 65 - 99 mg/dL   BUN 23 (H) 6 - 20 mg/dL   Creatinine, Ser 1.01 (H) 0.44 - 1.00 mg/dL   Calcium 9.1 8.9 - 10.3 mg/dL   GFR calc non Af Amer 46 (L) >60 mL/min   GFR calc Af Amer 54 (L) >60 mL/min    Comment: (NOTE) The eGFR has been  calculated using the CKD EPI equation. This calculation has not been validated in all clinical situations. eGFR's persistently <60 mL/min signify possible Chronic Kidney Disease.    Anion gap 13 5 - 15  CBC with Differential     Status: Abnormal   Collection Time: 04/05/17  9:45 AM  Result Value Ref Range   WBC 3.5 (L) 3.6 - 11.0 K/uL   RBC 4.09 3.80 - 5.20 MIL/uL   Hemoglobin 12.9 12.0 - 16.0 g/dL   HCT 39.6 35.0 - 47.0 %   MCV 97.0 80.0 - 100.0 fL   MCH 31.6 26.0 - 34.0 pg   MCHC 32.6 32.0 - 36.0 g/dL   RDW 17.7 (H) 11.5 - 14.5 %   Platelets 85 (L)  150 - 440 K/uL   Neutrophils Relative % 71 %   Neutro Abs 2.5 1.4 - 6.5 K/uL   Lymphocytes Relative 16 %   Lymphs Abs 0.6 (L) 1.0 - 3.6 K/uL   Monocytes Relative 10 %   Monocytes Absolute 0.3 0.2 - 0.9 K/uL   Eosinophils Relative 2 %   Eosinophils Absolute 0.1 0 - 0.7 K/uL   Basophils Relative 1 %   Basophils Absolute 0.0 0 - 0.1 K/uL  Protime-INR     Status: Abnormal   Collection Time: 04/05/17  9:45 AM  Result Value Ref Range   Prothrombin Time 25.7 (H) 11.4 - 15.2 seconds   INR 2.37     Radiology No results found.    Assessment/Plan Diabetes (HCC) blood glucose control important in reducing the progression of atherosclerotic disease. Also, involved in wound healing. On appropriate medications.  Hypertension blood pressure control important in reducing the progression of atherosclerotic disease. On appropriate oral medications.  Lower extremity ulceration, left, limited to breakdown of skin ([place]) healed  Swelling of limb Better.  Will come out of the Unna boots and go to compression stockings daily.  Return as needed.    Leotis Pain, MD  06/26/2017 3:26 PM    This note was created with Dragon medical transcription system.  Any errors from dictation are purely unintentional

## 2017-06-26 NOTE — Assessment & Plan Note (Signed)
healed 

## 2017-06-27 ENCOUNTER — Other Ambulatory Visit: Payer: Self-pay | Admitting: Physical Medicine and Rehabilitation

## 2017-06-27 DIAGNOSIS — M5416 Radiculopathy, lumbar region: Secondary | ICD-10-CM

## 2017-07-04 ENCOUNTER — Ambulatory Visit
Admission: RE | Admit: 2017-07-04 | Discharge: 2017-07-04 | Disposition: A | Discharge status: Home / Self Care | Payer: Medicare Other | Source: Ambulatory Visit | Attending: Physical Medicine and Rehabilitation | Admitting: Physical Medicine and Rehabilitation

## 2017-07-04 DIAGNOSIS — M48061 Spinal stenosis, lumbar region without neurogenic claudication: Secondary | ICD-10-CM | POA: Insufficient documentation

## 2017-07-04 DIAGNOSIS — M5416 Radiculopathy, lumbar region: Secondary | ICD-10-CM

## 2017-07-13 ENCOUNTER — Other Ambulatory Visit: Payer: Self-pay | Admitting: Physical Medicine and Rehabilitation

## 2017-07-13 DIAGNOSIS — M5416 Radiculopathy, lumbar region: Secondary | ICD-10-CM

## 2017-07-16 ENCOUNTER — Other Ambulatory Visit: Payer: Self-pay | Admitting: Physical Medicine and Rehabilitation

## 2017-07-16 ENCOUNTER — Other Ambulatory Visit: Payer: Self-pay | Admitting: Specialist

## 2017-07-16 DIAGNOSIS — M5416 Radiculopathy, lumbar region: Secondary | ICD-10-CM

## 2017-07-19 ENCOUNTER — Encounter
Admission: RE | Admit: 2017-07-19 | Discharge: 2017-07-19 | Disposition: A | Discharge status: Home / Self Care | Payer: Medicare Other | Source: Ambulatory Visit | Attending: Specialist | Admitting: Specialist

## 2017-07-19 ENCOUNTER — Other Ambulatory Visit: Payer: Self-pay

## 2017-07-19 DIAGNOSIS — Z9889 Other specified postprocedural states: Secondary | ICD-10-CM | POA: Insufficient documentation

## 2017-07-19 DIAGNOSIS — M7989 Other specified soft tissue disorders: Secondary | ICD-10-CM | POA: Insufficient documentation

## 2017-07-19 DIAGNOSIS — Z79899 Other long term (current) drug therapy: Secondary | ICD-10-CM | POA: Insufficient documentation

## 2017-07-19 DIAGNOSIS — Z01818 Encounter for other preprocedural examination: Secondary | ICD-10-CM | POA: Insufficient documentation

## 2017-07-19 DIAGNOSIS — I10 Essential (primary) hypertension: Secondary | ICD-10-CM | POA: Insufficient documentation

## 2017-07-19 DIAGNOSIS — Z79891 Long term (current) use of opiate analgesic: Secondary | ICD-10-CM | POA: Insufficient documentation

## 2017-07-19 DIAGNOSIS — E119 Type 2 diabetes mellitus without complications: Secondary | ICD-10-CM | POA: Insufficient documentation

## 2017-07-19 DIAGNOSIS — Z90710 Acquired absence of both cervix and uterus: Secondary | ICD-10-CM | POA: Insufficient documentation

## 2017-07-19 HISTORY — DX: Unspecified osteoarthritis, unspecified site: M19.90

## 2017-07-19 NOTE — Pre-Procedure Instructions (Signed)
Dr. De [place] office will fax her recent labs to [place].  Faxed a clearance form and pacemaker interrogation form to Dr.Masoud's office.  Requested recommendations for anticoagulation.

## 2017-07-19 NOTE — Patient Instructions (Signed)
Your procedure is scheduled on: Wed. 07/25/17 Report to Day Surgery. To find out your arrival time please call [phone] between 1PM - 3PM on Tues. 07/24/17.  Remember: Instructions that are not followed completely may result in serious medical risk, up to and including death, or upon the discretion of your surgeon and anesthesiologist your surgery may need to be rescheduled.     _X__ 1. Do not eat food after midnight the night before your procedure.                 No gum chewing or hard candies. You may drink clear liquids up to 2 hours                 before you are scheduled to arrive for your surgery- DO not drink clear                 liquids within 2 hours of the start of your surgery.                 Clear Liquids include:  water,   __X__2.  On the morning of surgery brush your teeth with toothpaste and water, you                 may rinse your mouth with mouthwash if you wish.  Do not swallow any              toothpaste of mouthwash.     ___ 3.  No Alcohol for 24 hours before or after surgery.   ___ 4.  Do Not Smoke or use e-cigarettes For 24 Hours Prior to Your Surgery.                 Do not use any chewable tobacco products for at least 6 hours prior to                 surgery.  ____  5.  Bring all medications with you on the day of surgery if instructed.   __x__  6.  Notify your doctor if there is any change in your medical condition      (cold, fever, infections).     Do not wear jewelry, make-up, hairpins, clips or nail polish. Do not wear lotions, powders, or perfumes. You may wear deodorant. Do not shave 48 hours prior to surgery. Men may shave face and neck. Do not bring valuables to the hospital.    [facility] is not responsible for any belongings or valuables.  Contacts, dentures or bridgework may not be worn into surgery. Leave your suitcase in the car. After surgery it may be brought to your room. For patients admitted to the  hospital, discharge time is determined by your treatment team.   Patients discharged the day of surgery will not be allowed to drive home.   Please read over the following fact sheets that you were given:   __x__ Take these medicines the morning of surgery with A SIP OF WATER:    1. diltiazem (DILACOR XR) 180 MG 24 hr capsule  2. HYDROcodone-acetaminophen (NORCO/VICODIN) 5-325 MG tablet if needed  3. levothyroxine (SYNTHROID, LEVOTHROID) 112 MCG tablet  4.pregabalin (LYRICA) 100 MG capsule  5.  6.  ____ Fleet Enema (as directed)   _x___ Use CHG Soap/ sage wipes as directed  ____ Use inhalers on the day of surgery  _x___ Stop metformin 2 days prior to surgery Last dose on 07/22/17    ____ Take 1/2  of usual insulin dose the night before surgery. No insulin the morning          of surgery.   ___x_ Stop Coumadin and aspirin as per Dr. Jennette Kettle recommendation  ____ Stop Anti-inflammatories on    ____ Stop supplements until after surgery.    ____ Bring C-Pap to the hospital.

## 2017-07-23 NOTE — Pre-Procedure Instructions (Signed)
CLEARED MOD RISK FOR ANESTHESIA. WOULD NOT CLEAR FOR GEN ANES. CALLED TO Debra Butler AT [facility] AND ASKED THAT THEY NOTIFY PATIENT COUMADIN CAN BE STOPPED 7-10 DAYS PREOP

## 2017-07-23 NOTE — Anesthesia Pain Management Evaluation Note (Signed)
DR Carlyle Lipa FOR MAC/ NO GENERAL ANESTHESIA. MOD RISK AND DR MILLER'S OFFICE NOTIFIED

## 2017-07-24 MED ORDER — CLINDAMYCIN PHOSPHATE 600 MG/50ML IV SOLN
600.0000 mg | INTRAVENOUS | Status: AC
Start: 2017-07-25 — End: 2017-07-25
  Administered 2017-07-25: 600 mg via INTRAVENOUS

## 2017-07-24 MED ORDER — CEFAZOLIN SODIUM-DEXTROSE 2-4 GM/100ML-% IV SOLN
2.0000 g | INTRAVENOUS | Status: AC
  Administered 2017-07-25: 2 g via INTRAVENOUS

## 2017-07-24 NOTE — Pre-Procedure Instructions (Signed)
RECEIVED PACEMAKER PROGRAMMING SHEET FROM DR Lavera Guise AND NOTE TO READ RECOMMENDATIONS PUT IN ANES NOTE

## 2017-07-24 NOTE — Anesthesia Pain Management Evaluation Note (Signed)
CALL FROM DR [facility] OFFICE AND HE IS GOOD WITH PATIENT HAVING IV / NOT GENERAL ANESTHESIA

## 2017-07-24 NOTE — Anesthesia Pain Management Evaluation Note (Signed)
PLEASE READ PACEMAKER DEVICE PROGRAMMING SHEET ON CHART FROM DR Lavera Guise 07/23/17.NEED TO AVOID ELECTROCAUTERY.

## 2017-07-24 NOTE — Pre-Procedure Instructions (Signed)
[place] OFFICE. STATES DR Sabra Heck IS OK WITH PATIENT HAVING IV SEDATION FOR SURGERY

## 2017-07-25 ENCOUNTER — Other Ambulatory Visit: Payer: Self-pay

## 2017-07-25 ENCOUNTER — Ambulatory Visit: Payer: Medicare Other | Admitting: Certified Registered"

## 2017-07-25 ENCOUNTER — Encounter: Payer: Self-pay | Admitting: *Deleted

## 2017-07-25 ENCOUNTER — Encounter
Admission: RE | Disposition: A | Discharge status: Home / Self Care | Payer: Self-pay | Source: Ambulatory Visit | Attending: Specialist

## 2017-07-25 ENCOUNTER — Ambulatory Visit
Admission: RE | Admit: 2017-07-25 | Discharge: 2017-07-25 | Disposition: A | Discharge status: Home / Self Care | Payer: Medicare Other | Source: Ambulatory Visit | Attending: Specialist | Admitting: Specialist

## 2017-07-25 DIAGNOSIS — G5602 Carpal tunnel syndrome, left upper limb: Secondary | ICD-10-CM | POA: Insufficient documentation

## 2017-07-25 DIAGNOSIS — K589 Irritable bowel syndrome without diarrhea: Secondary | ICD-10-CM | POA: Diagnosis not present

## 2017-07-25 DIAGNOSIS — E119 Type 2 diabetes mellitus without complications: Secondary | ICD-10-CM | POA: Diagnosis not present

## 2017-07-25 DIAGNOSIS — Z7984 Long term (current) use of oral hypoglycemic drugs: Secondary | ICD-10-CM | POA: Insufficient documentation

## 2017-07-25 DIAGNOSIS — Z882 Allergy status to sulfonamides status: Secondary | ICD-10-CM | POA: Diagnosis not present

## 2017-07-25 DIAGNOSIS — Z86718 Personal history of other venous thrombosis and embolism: Secondary | ICD-10-CM | POA: Insufficient documentation

## 2017-07-25 DIAGNOSIS — M1990 Unspecified osteoarthritis, unspecified site: Secondary | ICD-10-CM | POA: Insufficient documentation

## 2017-07-25 DIAGNOSIS — I10 Essential (primary) hypertension: Secondary | ICD-10-CM | POA: Insufficient documentation

## 2017-07-25 DIAGNOSIS — Z7982 Long term (current) use of aspirin: Secondary | ICD-10-CM | POA: Insufficient documentation

## 2017-07-25 DIAGNOSIS — Z79899 Other long term (current) drug therapy: Secondary | ICD-10-CM | POA: Diagnosis not present

## 2017-07-25 DIAGNOSIS — Z888 Allergy status to other drugs, medicaments and biological substances status: Secondary | ICD-10-CM | POA: Diagnosis not present

## 2017-07-25 DIAGNOSIS — I252 Old myocardial infarction: Secondary | ICD-10-CM | POA: Diagnosis not present

## 2017-07-25 HISTORY — PX: CARPAL TUNNEL RELEASE: SHX101

## 2017-07-25 LAB — GLUCOSE, CAPILLARY
GLUCOSE-CAPILLARY: 122 mg/dL — AB (ref 65–99)
Glucose-Capillary: 136 mg/dL — ABNORMAL HIGH (ref 65–99)

## 2017-07-25 LAB — PROTIME-INR
INR: 1.15
PROTHROMBIN TIME: 14.6 s (ref 11.4–15.2)

## 2017-07-25 SURGERY — CARPAL TUNNEL RELEASE
Anesthesia: Regional | Site: Hand | Laterality: Left | Wound class: Clean

## 2017-07-25 MED ORDER — MELOXICAM 7.5 MG PO TABS
ORAL_TABLET | ORAL | Status: AC
  Administered 2017-07-25: 7.5 mg via ORAL
  Filled 2017-07-25: qty 1

## 2017-07-25 MED ORDER — PROPOFOL 500 MG/50ML IV EMUL
INTRAVENOUS | Status: DC | PRN
  Administered 2017-07-25: 25 ug/kg/min via INTRAVENOUS

## 2017-07-25 MED ORDER — LIDOCAINE HCL (PF) 0.5 % IJ SOLN
INTRAMUSCULAR | Status: DC | PRN
  Administered 2017-07-25: 50 mL via INTRAVENOUS

## 2017-07-25 MED ORDER — CHLORHEXIDINE GLUCONATE CLOTH 2 % EX PADS: 6.0000 | MEDICATED_PAD | Freq: Once | CUTANEOUS | Status: DC

## 2017-07-25 MED ORDER — ONDANSETRON HCL 4 MG/2ML IJ SOLN: 4.0000 mg | Freq: Once | INTRAMUSCULAR | Status: DC | PRN

## 2017-07-25 MED ORDER — FAMOTIDINE 20 MG PO TABS
20.0000 mg | ORAL_TABLET | Freq: Once | ORAL | Status: AC
  Administered 2017-07-25: 20 mg via ORAL

## 2017-07-25 MED ORDER — GABAPENTIN 400 MG PO CAPS
400.0000 mg | ORAL_CAPSULE | ORAL | Status: AC
  Administered 2017-07-25: 400 mg via ORAL

## 2017-07-25 MED ORDER — CEFAZOLIN SODIUM-DEXTROSE 2-4 GM/100ML-% IV SOLN
INTRAVENOUS | Status: AC
  Filled 2017-07-25: qty 100

## 2017-07-25 MED ORDER — HYDROCODONE-ACETAMINOPHEN 5-325 MG PO TABS: 1.0000 | ORAL_TABLET | Freq: Four times a day (QID) | ORAL | 0 refills | Status: DC | PRN

## 2017-07-25 MED ORDER — PROPOFOL 10 MG/ML IV BOLUS
INTRAVENOUS | Status: DC | PRN
  Administered 2017-07-25 (×2): 20 mg via INTRAVENOUS

## 2017-07-25 MED ORDER — MELOXICAM 7.5 MG PO TABS
7.5000 mg | ORAL_TABLET | ORAL | Status: AC
  Administered 2017-07-25: 7.5 mg via ORAL

## 2017-07-25 MED ORDER — PROPOFOL 10 MG/ML IV BOLUS
INTRAVENOUS | Status: AC
  Filled 2017-07-25: qty 20

## 2017-07-25 MED ORDER — BUPIVACAINE HCL 0.5 % IJ SOLN
INTRAMUSCULAR | Status: DC | PRN
  Administered 2017-07-25: 17 mL

## 2017-07-25 MED ORDER — FENTANYL CITRATE (PF) 100 MCG/2ML IJ SOLN: 25.0000 ug | INTRAMUSCULAR | Status: DC | PRN

## 2017-07-25 MED ORDER — LIDOCAINE HCL (PF) 0.5 % IJ SOLN
INTRAMUSCULAR | Status: AC
  Filled 2017-07-25: qty 50

## 2017-07-25 MED ORDER — GABAPENTIN 400 MG PO CAPS
ORAL_CAPSULE | ORAL | Status: AC
  Administered 2017-07-25: 400 mg via ORAL
  Filled 2017-07-25: qty 1

## 2017-07-25 MED ORDER — FAMOTIDINE 20 MG PO TABS
ORAL_TABLET | ORAL | Status: AC
  Administered 2017-07-25: 20 mg via ORAL
  Filled 2017-07-25: qty 1

## 2017-07-25 MED ORDER — SODIUM CHLORIDE 0.9 % IV SOLN
INTRAVENOUS | Status: DC
  Administered 2017-07-25: 10:00:00 via INTRAVENOUS

## 2017-07-25 MED ORDER — CLINDAMYCIN PHOSPHATE 600 MG/50ML IV SOLN
INTRAVENOUS | Status: AC
  Filled 2017-07-25: qty 50

## 2017-07-25 SURGICAL SUPPLY — 27 items
BLADE SURG MINI STRL (BLADE) ×3 IMPLANT
BNDG ESMARK 4X12 TAN STRL LF (GAUZE/BANDAGES/DRESSINGS) ×3 IMPLANT
CANISTER SUCT 1200ML W/VALVE (MISCELLANEOUS) ×3 IMPLANT
CHLORAPREP W/TINT 26ML (MISCELLANEOUS) ×3 IMPLANT
CUFF DUAL TOURNIQUET 24IN DISP (TOURNIQUET CUFF) ×3 IMPLANT
CUFF TOURN 18 STER (MISCELLANEOUS) IMPLANT
ELECT REM PT RETURN 9FT ADLT (ELECTROSURGICAL) ×3
ELECTRODE REM PT RTRN 9FT ADLT (ELECTROSURGICAL) ×1 IMPLANT
GAUZE FLUFF 18X24 1PLY STRL (GAUZE/BANDAGES/DRESSINGS) ×3 IMPLANT
GAUZE PETRO XEROFOAM 1X8 (MISCELLANEOUS) ×3 IMPLANT
GLOVE BIO SURGEON STRL SZ8 (GLOVE) ×3 IMPLANT
GOWN STRL REUS W/ TWL LRG LVL3 (GOWN DISPOSABLE) ×1 IMPLANT
GOWN STRL REUS W/TWL LRG LVL3 (GOWN DISPOSABLE) ×2
GOWN STRL REUS W/TWL LRG LVL4 (GOWN DISPOSABLE) ×3 IMPLANT
KIT TURNOVER KIT A (KITS) ×3 IMPLANT
NS IRRIG 500ML POUR BTL (IV SOLUTION) ×3 IMPLANT
PACK EXTREMITY ARMC (MISCELLANEOUS) ×3 IMPLANT
PAD PREP 24X41 OB/GYN DISP (PERSONAL CARE ITEMS) ×3 IMPLANT
PADDING CAST 4IN STRL (MISCELLANEOUS) ×2
PADDING CAST BLEND 4X4 STRL (MISCELLANEOUS) ×1 IMPLANT
SPLINT CAST 1 STEP 3X12 (MISCELLANEOUS) ×3 IMPLANT
STOCKINETTE BIAS CUT 4 980044 (GAUZE/BANDAGES/DRESSINGS) ×3 IMPLANT
STOCKINETTE STRL 4IN 9604848 (GAUZE/BANDAGES/DRESSINGS) ×3 IMPLANT
SUT ETHILON 4-0 (SUTURE) ×2
SUT ETHILON 4-0 FS2 18XMFL BLK (SUTURE) ×1
SUT ETHILON 5-0 FS-2 18 BLK (SUTURE) ×3 IMPLANT
SUTURE ETHLN 4-0 FS2 18XMF BLK (SUTURE) ×1 IMPLANT

## 2017-07-25 NOTE — H&P (Signed)
THE PATIENT WAS SEEN PRIOR TO SURGERY TODAY.  HISTORY, ALLERGIES, HOME MEDICATIONS AND OPERATIVE PROCEDURE WERE REVIEWED. RISKS AND BENEFITS OF SURGERY DISCUSSED WITH PATIENT AGAIN.  NO CHANGES FROM INITIAL HISTORY AND PHYSICAL NOTED.    

## 2017-07-25 NOTE — Anesthesia Post-op Follow-up Note (Signed)
Anesthesia [facility] form completed.        

## 2017-07-25 NOTE — Op Note (Signed)
07/25/2017  11:58 AM  PATIENT:  Debra Butler    PRE-OPERATIVE DIAGNOSIS: LEFT CARPAL TUNNEL SYNDROME  POST-OPERATIVE DIAGNOSIS: LEFT CARPAL TUNNEL SYNDROME  PROCEDURE:  LEFT CARPAL TUNNEL RELEASE  SURGEON: Park Breed, MD  TOURNIQUET TIME: 46  MIN   ANESTHESIA:   [facility]  PREOPERATIVE INDICATIONS:  [name] is a  84 y.o. female with a diagnosis of left carpal tunnel syndrome who failed conservative measures and elected for surgical management.    The risks benefits and alternatives were discussed with the patient preoperatively including but not limited to the risks of infection, bleeding, nerve injury, incomplete relief of symptoms, pillar pain, cardiopulmonary complications, the need for revision surgery, among others, and the patient was willing to proceed.  OPERATIVE FINDINGS: Thickened volar ligament and nerve compression.  OPERATIVE PROCEDURE: The patient is brought to the operating room placed in the supine position. IV regional anesthesia was administered. The left upper extremity was prepped and draped in usual sterile fashion. Time out was performed. The arm was elevated and exsanguinated and the tourniquet was inflated. Incision was made in line with the radial border of the ring finger. The carpal tunnel transverse fascia was identified, cleaned, and incised sharply. The common sensory branches were visualized along with the superficial palmar arch and protected.  The median nerve was protected below. A Kelly clamp was  placed underneath the transverse carpal ligament, protecting the nerve. I released the ligament completely, and then released the proximal distal volar forearm fascia. The nerve was identified, and visualized, and protected throughout the case. The motor branch was intact upon inspection. No masses or abnormalities were identified in the ulnar bursa.  The wounds were irrigated copiously, and the wounds injected with 1/2 % marcaqine, and the skin  closed with nylon followed by a volar splint and sterile gauze. Tourniquet was deflated with good return of blood flow to all fingers. Sponge and needle counts were correct.  The patient tolerated this well, with no complications. The patient was awakened and taken to recovery in good condition.

## 2017-07-25 NOTE — Anesthesia Preprocedure Evaluation (Addendum)
Anesthesia Evaluation  Patient identified by MRN, date of birth, ID band Patient awake    Reviewed: Allergy & Precautions, H&P , NPO status , Patient's Chart, lab work & pertinent test results, reviewed documented beta blocker date and time   Airway Mallampati: II   Neck ROM: full    Dental  (+) Poor Dentition   Pulmonary neg pulmonary ROS,    Pulmonary exam normal        Cardiovascular Exercise Tolerance: Poor hypertension, On Medications + Past MI  negative cardio ROS Normal cardiovascular exam Rhythm:regular Rate:Normal     Neuro/Psych negative neurological ROS  negative psych ROS   GI/Hepatic negative GI ROS, Neg liver ROS,   Endo/Other  negative endocrine ROSdiabetes, Well Controlled, Type 2  Renal/GU negative Renal ROS  negative genitourinary   Musculoskeletal   Abdominal   Peds  Hematology negative hematology ROS (+)   Anesthesia Other Findings Past Medical History: No date: Arthritis 2013: Blood clot in vein     Comment:  left leg 1973: Diabetes mellitus without complication ([place]) 7902: Hypertension No date: IBS (irritable bowel syndrome) No date: Myocardial infarction [facility]) Past Surgical History: age 69: ABDOMINAL HYSTERECTOMY     Comment:  partial, still has ovaries and tubes No date: APPENDECTOMY No date: BACK SURGERY     Comment:  screws and titanium No date: BREAST BIOPSY     Comment:  at age 66, benign 2010: CARPAL TUNNEL RELEASE; Right No date: CHOLECYSTECTOMY 1984: COLON SURGERY 2007, 2016: COLONOSCOPY     Comment:  Dr Jamal Collin No date: FOOT SURGERY 2010: FRACTURE SURGERY     Comment:  hip 2009: FRACTURE SURGERY     Comment:  femur No date: JOINT REPLACEMENT     Comment:  both knee 2013: PACEMAKER PLACEMENT No date: TONSILLECTOMY 2016: UPPER GI ENDOSCOPY     Comment:  Dr Jamal Collin BMI    Body Mass Index:  33.40 kg/m     Reproductive/Obstetrics negative OB ROS                              Anesthesia Physical Anesthesia Plan  ASA: III  Anesthesia Plan: Bier Block and Bier Block-LIDOCAINE ONLY   Post-op Pain Management:    Induction:   PONV Risk Score and Plan: 3  Airway Management Planned:   Additional Equipment:   Intra-op Plan:   Post-operative Plan:   Informed Consent: I have reviewed the patients History and Physical, chart, labs and discussed the procedure including the risks, benefits and alternatives for the proposed anesthesia with the patient or authorized representative who has indicated his/her understanding and acceptance.   Dental Advisory Given  Plan Discussed with: CRNA  Anesthesia Plan Comments:        Anesthesia Quick Evaluation

## 2017-07-25 NOTE — Transfer of Care (Signed)
Immediate Anesthesia Transfer of Care Note  Patient: Debra Butler  Procedure(s) Performed: CARPAL TUNNEL RELEASE (Left Hand)  Patient Location: PACU  Anesthesia Type:General and Bier block  Level of Consciousness: awake and alert   Airway & Oxygen Therapy: Patient Spontanous Breathing  Post-op Assessment: Report given to RN and Post -op Vital signs reviewed and stable  Post vital signs: Reviewed  Last Vitals:  Vitals:   07/25/17 0946 07/25/17 1202  BP: 108/65 106/80  Pulse:  80  Resp:  12  Temp:    SpO2: 94% 97%    Last Pain:  Vitals:   07/25/17 0944  TempSrc: Temporal         Complications: No apparent anesthesia complications

## 2017-07-25 NOTE — Anesthesia Postprocedure Evaluation (Signed)
Anesthesia Post Note  Patient: Debra Butler  Procedure(s) Performed: CARPAL TUNNEL RELEASE (Left Hand)  Patient location during evaluation: PACU Anesthesia Type: Bier Block Level of consciousness: awake and alert Pain management: pain level controlled Vital Signs Assessment: post-procedure vital signs reviewed and stable Respiratory status: spontaneous breathing, nonlabored ventilation, respiratory function stable and patient connected to nasal cannula oxygen Cardiovascular status: blood pressure returned to baseline and stable Postop Assessment: no apparent nausea or vomiting Anesthetic complications: no     Last Vitals:  Vitals:   07/25/17 1202 07/25/17 1217  BP: 106/80 111/77  Pulse: 80 63  Resp: 12 15  Temp: (!) 36.3 C   SpO2: 97% 99%    Last Pain:  Vitals:   07/25/17 1217  TempSrc:   PainSc: [place]

## 2017-07-25 NOTE — Discharge Instructions (Signed)
AMBULATORY SURGERY  °DISCHARGE INSTRUCTIONS ° ° °1) The drugs that you were given will stay in your system until tomorrow so for the next 24 hours you should not: ° °A) Drive an automobile °B) Make any legal decisions °C) Drink any alcoholic beverage ° ° °2) You may resume regular meals tomorrow.  Today it is better to start with liquids and gradually work up to solid foods. ° °You may eat anything you prefer, but it is better to start with liquids, then soup and crackers, and gradually work up to solid foods. ° ° °3) Please notify your doctor immediately if you have any unusual bleeding, trouble breathing, redness and pain at the surgery site, drainage, fever, or pain not relieved by medication. ° ° ° °4) Additional Instructions: ° ° ° ° ° ° ° °Please contact your physician with any problems or Same Day Surgery at [phone], Monday through Friday 6 am to 4 pm, or [place]  at [place] Main number at [phone].AMBULATORY SURGERY  °DISCHARGE INSTRUCTIONS ° ° °5) The drugs that you were given will stay in your system until tomorrow so for the next 24 hours you should not: ° °D) Drive an automobile °E) Make any legal decisions °F) Drink any alcoholic beverage ° ° °6) You may resume regular meals tomorrow.  Today it is better to start with liquids and gradually work up to solid foods. ° °You may eat anything you prefer, but it is better to start with liquids, then soup and crackers, and gradually work up to solid foods. ° ° °7) Please notify your doctor immediately if you have any unusual bleeding, trouble breathing, redness and pain at the surgery site, drainage, fever, or pain not relieved by medication. ° ° ° °8) Additional Instructions: ° ° ° ° ° ° ° °Please contact your physician with any problems or Same Day Surgery at [phone], Monday through Friday 6 am to 4 pm, or [place] at [place] Main number at [phone].AMBULATORY SURGERY  °DISCHARGE INSTRUCTIONS ° ° °9) The drugs that you were given  will stay in your system until tomorrow so for the next 24 hours you should not: ° °G) Drive an automobile °H) Make any legal decisions °I) Drink any alcoholic beverage ° ° °10) You may resume regular meals tomorrow.  Today it is better to start with liquids and gradually work up to solid foods. ° °You may eat anything you prefer, but it is better to start with liquids, then soup and crackers, and gradually work up to solid foods. ° ° °11) Please notify your doctor immediately if you have any unusual bleeding, trouble breathing, redness and pain at the surgery site, drainage, fever, or pain not relieved by medication. ° ° ° °12) Additional Instructions: ° ° ° ° ° ° ° °Please contact your physician with any problems or Same Day Surgery at [phone], Monday through Friday 6 am to 4 pm, or [place] at [place] Main number at [phone]. °

## 2017-07-25 NOTE — Progress Notes (Signed)
Can wiggle fingers on left   Warm and dry   Capillary refill positive on left

## 2017-09-26 ENCOUNTER — Telehealth (INDEPENDENT_AMBULATORY_CARE_PROVIDER_SITE_OTHER): Payer: Self-pay

## 2017-09-26 NOTE — Telephone Encounter (Signed)
I spoke with the patient and at this time she denies any pain or swelling.The patient also stated she agree with what JD advise and states she will call if anything change

## 2017-10-23 ENCOUNTER — Telehealth (INDEPENDENT_AMBULATORY_CARE_PROVIDER_SITE_OTHER): Payer: Self-pay | Admitting: Vascular Surgery

## 2017-10-23 NOTE — Telephone Encounter (Signed)
Patient called requested to speak with April as she has dealt with her in the past about a similar issue. States she scraped her leg on the car door on Friday and had a blister. She covered it and put ointment on it as previously instructed but states that now it is swollen and has a blood blister. She states no fever just painful and swollen.Please call and advise.

## 2017-10-23 NOTE — Telephone Encounter (Signed)
Patient will coming in the office tomorrow for a unna boot to be place

## 2017-10-24 ENCOUNTER — Encounter (INDEPENDENT_AMBULATORY_CARE_PROVIDER_SITE_OTHER): Payer: Self-pay

## 2017-10-24 ENCOUNTER — Ambulatory Visit (INDEPENDENT_AMBULATORY_CARE_PROVIDER_SITE_OTHER): Payer: Medicare Other | Admitting: Vascular Surgery

## 2017-10-24 VITALS — BP 110/71 | HR 74 | Resp 16 | Ht 60.0 in

## 2017-10-24 DIAGNOSIS — R600 Localized edema: Secondary | ICD-10-CM | POA: Diagnosis not present

## 2017-10-24 DIAGNOSIS — M7989 Other specified soft tissue disorders: Secondary | ICD-10-CM

## 2017-10-24 DIAGNOSIS — I890 Lymphedema, not elsewhere classified: Secondary | ICD-10-CM

## 2017-10-24 NOTE — Progress Notes (Signed)
History of Present Illness  There is no documented history at this time  Assessments & Plan   There are no diagnoses linked to this encounter.    Additional instructions  Subjective:  Patient presents with venous ulcer of the Right lower extremity.    Procedure:  3 layer unna wrap was placed Right lower extremity.   Plan:   Follow up in one week.   

## 2017-10-29 ENCOUNTER — Emergency Department
Admission: EM | Admit: 2017-10-29 | Discharge: 2017-10-29 | Disposition: A | Discharge status: Home / Self Care | Payer: Medicare Other | Attending: Emergency Medicine | Admitting: Emergency Medicine

## 2017-10-29 ENCOUNTER — Other Ambulatory Visit: Payer: Self-pay

## 2017-10-29 DIAGNOSIS — Z7901 Long term (current) use of anticoagulants: Secondary | ICD-10-CM | POA: Diagnosis not present

## 2017-10-29 DIAGNOSIS — E119 Type 2 diabetes mellitus without complications: Secondary | ICD-10-CM | POA: Insufficient documentation

## 2017-10-29 DIAGNOSIS — Z7982 Long term (current) use of aspirin: Secondary | ICD-10-CM | POA: Diagnosis not present

## 2017-10-29 DIAGNOSIS — I10 Essential (primary) hypertension: Secondary | ICD-10-CM | POA: Diagnosis not present

## 2017-10-29 DIAGNOSIS — R040 Epistaxis: Secondary | ICD-10-CM | POA: Diagnosis present

## 2017-10-29 DIAGNOSIS — Z7984 Long term (current) use of oral hypoglycemic drugs: Secondary | ICD-10-CM | POA: Insufficient documentation

## 2017-10-29 DIAGNOSIS — Z79899 Other long term (current) drug therapy: Secondary | ICD-10-CM | POA: Insufficient documentation

## 2017-10-29 LAB — CBC
HCT: 33.9 % — ABNORMAL LOW (ref 35.0–47.0)
Hemoglobin: 11.5 g/dL — ABNORMAL LOW (ref 12.0–16.0)
MCH: 33.9 pg (ref 26.0–34.0)
MCHC: 33.8 g/dL (ref 32.0–36.0)
MCV: 100.2 fL — ABNORMAL HIGH (ref 80.0–100.0)
PLATELETS: 108 10*3/uL — AB (ref 150–440)
RBC: 3.38 MIL/uL — ABNORMAL LOW (ref 3.80–5.20)
RDW: 17.5 % — AB (ref 11.5–14.5)
WBC: 5.4 10*3/uL (ref 3.6–11.0)

## 2017-10-29 LAB — BASIC METABOLIC PANEL
Anion gap: 9 (ref 5–15)
BUN: 27 mg/dL — AB (ref 6–20)
CALCIUM: 8.9 mg/dL (ref 8.9–10.3)
CHLORIDE: 101 mmol/L (ref 101–111)
CO2: 30 mmol/L (ref 22–32)
CREATININE: 1.07 mg/dL — AB (ref 0.44–1.00)
GFR calc Af Amer: 50 mL/min — ABNORMAL LOW (ref >60)
GFR, EST NON AFRICAN AMERICAN: 43 mL/min — AB (ref >60)
Glucose, Bld: 166 mg/dL — ABNORMAL HIGH (ref 65–99)
Potassium: 4.7 mmol/L (ref 3.5–5.1)
SODIUM: 140 mmol/L (ref 135–145)

## 2017-10-29 LAB — PROTIME-INR
INR: 1.32
PROTHROMBIN TIME: 16.3 s — AB (ref 11.4–15.2)

## 2017-10-29 MED ORDER — ACETAMINOPHEN 500 MG PO TABS
1000.0000 mg | ORAL_TABLET | Freq: Once | ORAL | Status: AC
  Administered 2017-10-29: 1000 mg via ORAL
  Filled 2017-10-29: qty 2

## 2017-10-29 MED ORDER — CEPHALEXIN 500 MG PO CAPS: 500.0000 mg | ORAL_CAPSULE | Freq: Three times a day (TID) | ORAL | 0 refills | Status: AC

## 2017-10-29 MED ORDER — CEPHALEXIN 500 MG PO CAPS
500.0000 mg | ORAL_CAPSULE | Freq: Once | ORAL | Status: AC
  Administered 2017-10-29: 500 mg via ORAL
  Filled 2017-10-29: qty 1

## 2017-10-29 NOTE — ED Provider Notes (Signed)
[facility] Emergency Department Provider Note   ____________________________________________   First MD Initiated Contact with Patient 10/29/17 [phone]     (approximate)  I have reviewed the triage vital signs and the nursing notes.   HISTORY  Chief Complaint Epistaxis    HPI Messiah Lenna Sciara Quinones is a 84 y.o. female who comes into the hospital tonight with a nosebleed.  The patient takes Coumadin for atrial fibrillation.  She states that she does get nosebleeds often but the last time she had one was about 8 years ago.  She reports that she had just been down to put her slippers on and her nose started bleeding.  She tried to control it but could not.  She is bleeding out of her left nostril.  The patient called EMS.  They state that they did put a nasal clamp on and sprayed with Afrin but then thought blood was going down the patient's throat she was coughing and gagging so they brought her in for evaluation.   Past Medical History:  Diagnosis Date  . Arthritis   . Blood clot in vein 2013   left leg  . Diabetes mellitus without complication ([place]) 7096  . Hypertension 1988  . IBS (irritable bowel syndrome)   . Myocardial infarction [facility])     Patient Active Problem List   Diagnosis Date Noted  . Lower extremity ulceration, left, limited to breakdown of skin ([place]) 11/14/2016  . Chest pain 09/06/2016  . Lower extremity edema 07/31/2016  . Diabetes ([place]) 04/03/2016  . Lymphedema 04/03/2016  . Swelling of limb 04/03/2016  . Hypertension 05/29/1986    Past Surgical History:  Procedure Laterality Date  . ABDOMINAL HYSTERECTOMY  age 72   partial, still has ovaries and tubes  . APPENDECTOMY    . BACK SURGERY     screws and titanium  . BREAST BIOPSY     at age 52, benign  . CARPAL TUNNEL RELEASE Right 2010  . CARPAL TUNNEL RELEASE Left 07/25/2017   Procedure: CARPAL TUNNEL RELEASE;  Surgeon: Earnestine Leys, MD;  Location: [facility] ORS;  Service: Orthopedics;   Laterality: Left;  . CHOLECYSTECTOMY    . COLON SURGERY  1984  . COLONOSCOPY  2007, 2016   Dr Jamal Collin  . FOOT SURGERY    . FRACTURE SURGERY  2010   hip  . FRACTURE SURGERY  2009   femur  . JOINT REPLACEMENT     both knee  . PACEMAKER PLACEMENT  2013  . TONSILLECTOMY    . UPPER GI ENDOSCOPY  2016   Dr Jamal Collin    Prior to Admission medications   Medication Sig Start Date End Date Taking? Authorizing Provider  aspirin 81 MG tablet Take 81 mg by mouth daily.    [provider]  cephALEXin (KEFLEX) 500 MG capsule Take 1 capsule (500 mg total) by mouth 3 (three) times daily for 5 days. 10/29/17 11/03/17  Loney Hering, MD  cholecalciferol (VITAMIN D) 1000 units tablet Take 1,000 Units by mouth daily.    [provider]  cyanocobalamin (,VITAMIN B-12,) 1000 MCG/ML injection Inject 1,000 mcg into the muscle every 30 (thirty) days.    [provider]  diltiazem (DILACOR XR) 180 MG 24 hr capsule Take 180 mg by mouth daily.    [provider]  furosemide (LASIX) 80 MG tablet Take 80 mg by mouth 2 (two) times daily. In the morning & 4pm in the afternoon.    [provider]  glipiZIDE (GLUCOTROL XL) 5 MG 24 hr tablet Take 5 mg by mouth daily.    [provider]  HYDROcodone-acetaminophen (NORCO) 5-325 MG tablet Take 1 tablet by mouth every 6 (six) hours as needed. 07/25/17   Earnestine Leys, MD  HYDROcodone-acetaminophen (NORCO/VICODIN) 5-325 MG tablet Take 1 tablet by mouth every 4 (four) hours as needed (for pain.).     [provider]  levothyroxine (SYNTHROID, LEVOTHROID) 112 MCG tablet Take 112 mcg by mouth daily before breakfast.    [provider]  metFORMIN (GLUCOPHAGE) 1000 MG tablet Take 1,000 mg by mouth 2 (two) times daily. With Breakfast & with supper    [provider]  nitroGLYCERIN (NITROSTAT) 0.4 MG SL tablet Place 0.4 mg under the tongue every 5 (five) minutes as needed for chest pain.    [provider]  potassium chloride SA (K-DUR,KLOR-CON) 20 MEQ tablet Take 20 mEq by mouth 2 (two) times daily.    [provider]  Pramox-PE-Glycerin-Petrolatum (PREPARATION H) 1-0.25-14.4-15 % CREA Place 1 application rectally 3 (three) times daily as needed (for rectal discomfort/hemmorroids.).    [provider]  pregabalin (LYRICA) 100 MG capsule Take 100 mg by mouth 2 (two) times daily.    [provider]  warfarin (COUMADIN) 6 MG tablet Take 6 mg by mouth daily with supper.    [provider]    Allergies Sulphur [sulfur] and Phenergan [promethazine]  No family history on file.  Social History Social History   Tobacco Use  . Smoking status: Never Smoker  . Smokeless tobacco: Never Used  Substance Use Topics  . Alcohol use: No  . Drug use: No    Review of Systems  Constitutional: No fever/chills Eyes: No visual changes. ENT: Nosebleed Cardiovascular: Denies chest pain. Respiratory: Denies shortness of breath. Gastrointestinal: No abdominal pain.   Genitourinary: Negative for dysuria. Musculoskeletal: Negative for back pain. Skin: Negative for rash. Neurological: Negative for headaches, focal weakness or numbness.   ____________________________________________   PHYSICAL EXAM:  VITAL SIGNS: ED Triage Vitals  Enc Vitals Group     BP 10/29/17 0200 (!) 139/99     Pulse Rate 10/29/17 0200 (!) 118     Resp 10/29/17 0205 20     Temp 10/29/17 0209 98.1 F (36.7 C)     Temp Source 10/29/17 0205 Oral     SpO2 10/29/17 0153 95 %     Weight 10/29/17 0158 180 lb (81.6 kg)     Height 10/29/17 0158 5' (1.524 m)     Head Circumference --      Peak Flow --      Pain Score 10/29/17 0157 0     Pain Loc --      Pain Edu? --      Excl. in [place]? --     Constitutional: Alert and oriented. Well appearing and in moderate  distress. Eyes: Conjunctivae are normal. PERRL. EOMI. Head: Atraumatic. Nose: Significant amount of blood coming out  of left nostril, not stopped by nasal clamp in place Mouth/Throat: Clots in posterior oropharynx Cardiovascular: Normal rate, regular rhythm. Grossly normal heart sounds.  Good peripheral circulation. Respiratory: Normal respiratory effort.  No retractions. Lungs CTAB. Gastrointestinal: Soft and nontender. No distention.  Positive bowel sounds Musculoskeletal: No lower extremity tenderness nor edema.   Neurologic:  Normal speech and language.  Skin:  Skin is warm, dry and intact.  Psychiatric: Mood and affect are normal.   ____________________________________________   LABS (all labs ordered are listed,  but only abnormal results are displayed)  Labs Reviewed  CBC - Abnormal; Notable for the following components:      Result Value   RBC 3.38 (*)    Hemoglobin 11.5 (*)    HCT 33.9 (*)    MCV 100.2 (*)    RDW 17.5 (*)    Platelets 108 (*)    All other components within normal limits  BASIC METABOLIC PANEL - Abnormal; Notable for the following components:   Glucose, Bld 166 (*)    BUN 27 (*)    Creatinine, Ser 1.07 (*)    GFR calc non Af Amer 43 (*)    GFR calc Af Amer 50 (*)    All other components within normal limits  PROTIME-INR - Abnormal; Notable for the following components:   Prothrombin Time 16.3 (*)    All other components within normal limits   ____________________________________________  EKG  ED ECG REPORT I, Loney Hering, the attending physician, personally viewed and interpreted this ECG.   Date: 10/29/2017  EKG Time: 212  Rate: 91  Rhythm: atrial fibrillation, rate 91  Axis: normal  Intervals:none  ST&T Change: none  ____________________________________________  RADIOLOGY  ED MD interpretation:  none  Official radiology report(s): No results found.  ____________________________________________   PROCEDURES  Procedure(s) performed: please, see procedure note(s).  .Epistaxis Management Date/Time: 10/29/2017 2:12 AM Performed by:  Loney Hering, MD Authorized by: Loney Hering, MD   Consent:    Consent obtained:  Verbal   Consent given by:  Patient Anesthesia (see MAR for exact dosages):    Anesthesia method:  None Procedure details:    Treatment site:  L posterior   Treatment method:  Nasal balloon   Treatment episode: initial   Post-procedure details:    Assessment:  Bleeding stopped   Patient tolerance of procedure:  Tolerated well, no immediate complications    Critical Care performed: No  ____________________________________________   INITIAL IMPRESSION / ASSESSMENT AND PLAN / ED COURSE  As part of my medical decision making, I reviewed the following data within the electronic MEDICAL RECORD NUMBER  Notes from prior ED visits and [place] Controlled Substance Database  This is a 84 year old female who comes into the hospital today with some epistaxis.  The patient could not get the bleeding to stop neither could EMS.  When I arrived in the room the patient was bleeding significantly.  I took the nasal balloon and placed it in the patient's left nostril.  I blew up the balloon with approximately 3 cc of air and then increased to 5 cc and then approximately 8 cc.  The patient's bleeding improved and she did have some mild bleeding on the right.  We did check some blood work on the patient and decided to monitor her.  When I went back into check on the patient the bleeding was significantly improved and she had some blood-tinged mucus coming from her right nostril.  We did check a CBC and a BMP which showed a hemoglobin of 11.5 and the BMP was unremarkable.  The patient's last hemoglobin in November 2018 was 12.9.  I am still awaiting the results of the patient's INR which did not initially get run by the lab.  I will give the patient a dose of Keflex.  She also received two Tylenol for headache.  The patient's INR returned at 1.32.  The patient will be discharged home and encouraged to follow-up with ENT in 5  days.  ____________________________________________   FINAL CLINICAL IMPRESSION(S) / ED DIAGNOSES  Final diagnoses:  Epistaxis     ED Discharge Orders        Ordered    cephALEXin (KEFLEX) 500 MG capsule  3 times daily     10/29/17 0706       Note:  This document was prepared using Dragon voice recognition software and may include unintentional dictation errors.    Loney Hering, MD 10/29/17 [phone]

## 2017-10-29 NOTE — Progress Notes (Signed)
Chaplain responded to 01:48 page to the OR to meet an incoming patient. Patient was communicating with care team and no family or friends were present. Chaplain offered silent prayer for the patient and staff.

## 2017-10-29 NOTE — Discharge Instructions (Addendum)
Please follow-up with ENT for further evaluation of the nosebleed.  Please do not remove your packing without ENT present.  Please return with any worsening bleeding any abdominal pain chest pain or any other concerns.

## 2017-10-29 NOTE — ED Triage Notes (Signed)
Pt presents to ED via EMS for epistaxis, unresponsive to afrin administration. Pt takes coumadin. Pt's nosebleed started approximately 0015. Pt is coughing and gagging upon arrival

## 2017-10-31 ENCOUNTER — Ambulatory Visit (INDEPENDENT_AMBULATORY_CARE_PROVIDER_SITE_OTHER): Payer: Medicare Other | Admitting: Vascular Surgery

## 2017-10-31 ENCOUNTER — Encounter (INDEPENDENT_AMBULATORY_CARE_PROVIDER_SITE_OTHER): Payer: Self-pay | Admitting: Vascular Surgery

## 2017-10-31 VITALS — BP 119/72 | HR 96 | Resp 16 | Ht 60.0 in | Wt 180.0 lb

## 2017-10-31 DIAGNOSIS — I890 Lymphedema, not elsewhere classified: Secondary | ICD-10-CM

## 2017-10-31 DIAGNOSIS — S80821A Blister (nonthermal), right lower leg, initial encounter: Secondary | ICD-10-CM | POA: Diagnosis not present

## 2017-10-31 DIAGNOSIS — T148XXA Other injury of unspecified body region, initial encounter: Secondary | ICD-10-CM

## 2017-10-31 NOTE — Progress Notes (Signed)
Subjective:    Patient ID: Debra Butler, female    DOB: 12/19/1921, 84 y.o.   MRN: [id number] Chief Complaint  Patient presents with  . Follow-up    check unna boot   Patient presents with a chief complaint of progressively worsening right lower extremity swelling and blister formation.  The patient has a past medical history of lymphedema with exacerbations requiring the use of unna boot therapy.  The patient states that she engages in conservative therapy including wearing medical grade one compression socks, elevating her legs and remaining active however over the last few weeks has noticed a progressive worsening in her right lower extremity edema.  The patient accidentally hit the back of her right calf against a car door which has caused a blood blister.  This has not popped.  The patient notes that her edema is associated with some discomfort especially at night.  The patient has spent 1 week in 3 layer zinc oxide Unna wraps to the right lower extremity and is noted some improvement to her swelling.  The blister noted to the back of the right calf is intact.  The patient is on an antibiotic for a bloody nose which currently has a [employer] in it.  Patient denies any erythema to the bilateral lower extremity.  Patient denies any new or worsening ulcer formation to the bilateral lower extremity.  Patient denies any worsening in her edema to the legs.  Patient denies any fever, nausea or vomiting.   Review of Systems  Constitutional: Negative.   HENT: Negative.   Eyes: Negative.   Respiratory: Negative.   Cardiovascular: Positive for leg swelling.  Gastrointestinal: Negative.   Endocrine: Negative.   Genitourinary: Negative.   Musculoskeletal: Negative.   Skin: Positive for wound.  Allergic/Immunologic: Negative.   Neurological: Negative.   Hematological: Negative.   Psychiatric/Behavioral: Negative.       Objective:   Physical Exam  Constitutional: She is oriented to person,  place, and time. She appears well-developed and well-nourished. No distress.  HENT:  Head: Normocephalic and atraumatic.  Right Ear: External ear normal.  Left Ear: External ear normal.  Eyes: Pupils are equal, round, and reactive to light. Conjunctivae and EOM are normal.  Neck: Normal range of motion.  Cardiovascular: Normal rate, regular rhythm, normal heart sounds and intact distal pulses.  Pulses:      Radial pulses are 2+ on the right side, and 2+ on the left side.  Hard to palpate pedal pulses due to body habitus and edema however the bilateral feet are warm  Pulmonary/Chest: Effort normal and breath sounds normal.  Musculoskeletal: Normal range of motion. She exhibits edema (Mild to moderate right lower extremity edema.  Mild left lower extremity edema.).  Neurological: She is alert and oriented to person, place, and time.  Skin: Skin is warm and dry. She is not diaphoretic.  Intact blood blister noted to the back of the right calf.  There is no active cellulitis or open ulcerations noted to the bilateral lower extremity.  Psychiatric: She has a normal mood and affect. Her behavior is normal. Judgment and thought content normal.  Vitals reviewed.  BP 119/72 (BP Location: Right Arm)   Pulse 96   Resp 16   Ht 5' (1.524 m)   Wt 180 lb (81.6 kg)   BMI 35.15 kg/m   Past Medical History:  Diagnosis Date  . Arthritis   . Blood clot in vein 2013   left leg  .  Diabetes mellitus without complication ([place]) 7829  . Hypertension 1988  . IBS (irritable bowel syndrome)   . Myocardial infarction [facility])    Social History   Socioeconomic History  . Marital status: Married    Spouse name: Not on file  . Number of children: Not on file  . Years of education: Not on file  . Highest education level: Not on file  Occupational History  . Not on file  Social Needs  . Financial resource strain: Not on file  . Food insecurity:    Worry: Not on file    Inability: Not on file  .  Transportation needs:    Medical: Not on file    Non-medical: Not on file  Tobacco Use  . Smoking status: Never Smoker  . Smokeless tobacco: Never Used  Substance and Sexual Activity  . Alcohol use: No  . Drug use: No  . Sexual activity: Not on file  Lifestyle  . Physical activity:    Days per week: Not on file    Minutes per session: Not on file  . Stress: Not on file  Relationships  . Social connections:    Talks on phone: Not on file    Gets together: Not on file    Attends religious service: Not on file    Active member of club or organization: Not on file    Attends meetings of clubs or organizations: Not on file    Relationship status: Not on file  . Intimate partner violence:    Fear of current or ex partner: Not on file    Emotionally abused: Not on file    Physically abused: Not on file    Forced sexual activity: Not on file  Other Topics Concern  . Not on file  Social History Narrative  . Not on file   Past Surgical History:  Procedure Laterality Date  . ABDOMINAL HYSTERECTOMY  age 23   partial, still has ovaries and tubes  . APPENDECTOMY    . BACK SURGERY     screws and titanium  . BREAST BIOPSY     at age 65, benign  . CARPAL TUNNEL RELEASE Right 2010  . CARPAL TUNNEL RELEASE Left 07/25/2017   Procedure: CARPAL TUNNEL RELEASE;  Surgeon: Earnestine Leys, MD;  Location: [facility] ORS;  Service: Orthopedics;  Laterality: Left;  . CHOLECYSTECTOMY    . COLON SURGERY  1984  . COLONOSCOPY  2007, 2016   Dr Jamal Collin  . FOOT SURGERY    . FRACTURE SURGERY  2010   hip  . FRACTURE SURGERY  2009   femur  . JOINT REPLACEMENT     both knee  . PACEMAKER PLACEMENT  2013  . TONSILLECTOMY    . UPPER GI ENDOSCOPY  2016   Dr Jamal Collin   No family history on file.  Allergies  Allergen Reactions  . Sulphur [Sulfur] Hives    And seizure  . Phenergan [Promethazine] Other (See Comments)    Altered mental status      Assessment & Plan:  Patient presents with a chief  complaint of progressively worsening right lower extremity swelling and blister formation.  The patient has a past medical history of lymphedema with exacerbations requiring the use of unna boot therapy.  The patient states that she engages in conservative therapy including wearing medical grade one compression socks, elevating her legs and remaining active however over the last few weeks has noticed a progressive worsening in her right lower extremity edema.  The patient accidentally hit the back of her right calf against a car door which has caused a blood blister.  This has not popped.  The patient notes that her edema is associated with some discomfort especially at night.  The patient has spent 1 week in 3 layer zinc oxide Unna wraps to the right lower extremity and is noted some improvement to her swelling.  The blister noted to the back of the right calf is intact.  The patient is on an antibiotic for a bloody nose which currently has a [employer] in it.  Patient denies any erythema to the bilateral lower extremity.  Patient denies any new or worsening ulcer formation to the bilateral lower extremity.  Patient denies any worsening in her edema to the legs.  Patient denies any fever, nausea or vomiting.   1. Lymphedema - New Patient with a lymphedema exacerbation with failure of conservative therapy to control her edema. Patient with a intact blood blister to the back of the right calf Recommend continued treatment of a 3 layer zinc oxide Unna wrap to the right lower extremity until the patient's edema is fully controlled and the blood blister is healed so that I can transition her back into medical grade 1 compression socks The patient was given a new prescription and information on where to purchase new compression socks as the ones that she has on now are not tight enough We discussed appropriate elevation as heart level higher as much as possible The patient is to follow-up in 1 month so I can  assess her progress The patient was instructed to call the office in the interim if any worsening edema or ulcerations to the legs, feet or toes occurs. The patient expresses their understanding.  2. Blister - New As above  Current Outpatient Medications on File Prior to Visit  Medication Sig Dispense Refill  . aspirin 81 MG tablet Take 81 mg by mouth daily.    . cephALEXin (KEFLEX) 500 MG capsule Take 1 capsule (500 mg total) by mouth 3 (three) times daily for 5 days. 15 capsule 0  . cholecalciferol (VITAMIN D) 1000 units tablet Take 1,000 Units by mouth daily.    . cyanocobalamin (,VITAMIN B-12,) 1000 MCG/ML injection Inject 1,000 mcg into the muscle every 30 (thirty) days.    Marland Kitchen diltiazem (DILACOR XR) 180 MG 24 hr capsule Take 180 mg by mouth daily.    . furosemide (LASIX) 80 MG tablet Take 80 mg by mouth 2 (two) times daily. In the morning & 4pm in the afternoon.    Marland Kitchen glipiZIDE (GLUCOTROL XL) 5 MG 24 hr tablet Take 5 mg by mouth daily.    Marland Kitchen HYDROcodone-acetaminophen (NORCO) 5-325 MG tablet Take 1 tablet by mouth every 6 (six) hours as needed. 40 tablet 0  . HYDROcodone-acetaminophen (NORCO/VICODIN) 5-325 MG tablet Take 1 tablet by mouth every 4 (four) hours as needed (for pain.).     Marland Kitchen levothyroxine (SYNTHROID, LEVOTHROID) 112 MCG tablet Take 112 mcg by mouth daily before breakfast.    . metFORMIN (GLUCOPHAGE) 1000 MG tablet Take 1,000 mg by mouth 2 (two) times daily. With Breakfast & with supper    . nitroGLYCERIN (NITROSTAT) 0.4 MG SL tablet Place 0.4 mg under the tongue every 5 (five) minutes as needed for chest pain.    . potassium chloride SA (K-DUR,KLOR-CON) 20 MEQ tablet Take 20 mEq by mouth 2 (two) times daily.    . Pramox-PE-Glycerin-Petrolatum (PREPARATION H) 1-0.25-14.4-15 % CREA Place 1 application  rectally 3 (three) times daily as needed (for rectal discomfort/hemmorroids.).    Marland Kitchen pregabalin (LYRICA) 100 MG capsule Take 100 mg by mouth 2 (two) times daily.    Marland Kitchen warfarin  (COUMADIN) 6 MG tablet Take 6 mg by mouth daily with supper.     No current facility-administered medications on file prior to visit.    There are no Patient Instructions on file for this visit. No follow-ups on file.  KIMBERLY A STEGMAYER, PA-C

## 2017-11-07 ENCOUNTER — Encounter (INDEPENDENT_AMBULATORY_CARE_PROVIDER_SITE_OTHER): Payer: Self-pay

## 2017-11-07 ENCOUNTER — Ambulatory Visit (INDEPENDENT_AMBULATORY_CARE_PROVIDER_SITE_OTHER): Payer: Medicare Other | Admitting: Vascular Surgery

## 2017-11-07 VITALS — BP 122/73 | HR 94 | Resp 16 | Ht 60.0 in | Wt 206.0 lb

## 2017-11-07 DIAGNOSIS — I890 Lymphedema, not elsewhere classified: Secondary | ICD-10-CM | POA: Diagnosis not present

## 2017-11-07 NOTE — Progress Notes (Signed)
History of Present Illness  There is no documented history at this time  Assessments & Plan   There are no diagnoses linked to this encounter.    Additional instructions  Subjective:  Patient presents with venous ulcer of the Right lower extremity.    Procedure:  3 layer unna wrap was placed Right lower extremity.   Plan:   Follow up in one week.   

## 2017-11-14 ENCOUNTER — Ambulatory Visit (INDEPENDENT_AMBULATORY_CARE_PROVIDER_SITE_OTHER): Payer: Medicare Other | Admitting: Vascular Surgery

## 2017-11-14 ENCOUNTER — Encounter (INDEPENDENT_AMBULATORY_CARE_PROVIDER_SITE_OTHER): Payer: Self-pay

## 2017-11-14 VITALS — BP 104/67 | HR 71 | Resp 16 | Ht 60.0 in | Wt 206.0 lb

## 2017-11-14 DIAGNOSIS — I890 Lymphedema, not elsewhere classified: Secondary | ICD-10-CM | POA: Diagnosis not present

## 2017-11-14 NOTE — Progress Notes (Signed)
History of Present Illness  There is no documented history at this time  Assessments & Plan   There are no diagnoses linked to this encounter.    Additional instructions  Subjective:  Patient presents with venous ulcer of the Right lower extremity.    Procedure:  3 layer unna wrap was placed Right lower extremity.   Plan:   Follow up in one week.   

## 2017-11-20 ENCOUNTER — Encounter (INDEPENDENT_AMBULATORY_CARE_PROVIDER_SITE_OTHER): Payer: Self-pay | Admitting: Vascular Surgery

## 2017-11-20 ENCOUNTER — Ambulatory Visit (INDEPENDENT_AMBULATORY_CARE_PROVIDER_SITE_OTHER): Payer: Medicare Other | Admitting: Vascular Surgery

## 2017-11-20 VITALS — BP 109/72 | HR 84 | Resp 15 | Ht 60.0 in | Wt 206.0 lb

## 2017-11-20 DIAGNOSIS — M79605 Pain in left leg: Secondary | ICD-10-CM

## 2017-11-20 DIAGNOSIS — T148XXA Other injury of unspecified body region, initial encounter: Secondary | ICD-10-CM | POA: Diagnosis not present

## 2017-11-20 DIAGNOSIS — M79604 Pain in right leg: Secondary | ICD-10-CM | POA: Diagnosis not present

## 2017-11-20 DIAGNOSIS — I890 Lymphedema, not elsewhere classified: Secondary | ICD-10-CM | POA: Diagnosis not present

## 2017-11-20 NOTE — Progress Notes (Signed)
Subjective:    Patient ID: Debra Butler, female    DOB: 1922/05/03, 84 y.o.   MRN: [id number] No chief complaint on file.  Patient presents for a monthly unna boot therapy / wound check.  The patient has been undergoing right lower extremity Unna wraps to the right lower extremity for a lymphedema exacerbation with blister formation to the back of the calf.  The blister formed after hitting the back of her right calf against a car door.  The blister has since popped and is now on ulcer.  The patient notes an improvement in her edema and slow healing to the ulcer site.  The patient is experiencing abnormal discomfort to the right lower extremity.  The patient notes progressively worse pain especially with ambulation to the right lower extremity.  We have not assessed the patient's lower extremity arterial status in some time.  Patient denies any new/worsening ulcer formation.  Patient denies any erythema to the right lower extremity.  Patient denies any fever, nausea or vomiting.  Review of Systems  Constitutional: Negative.   HENT: Negative.   Eyes: Negative.   Respiratory: Negative.   Cardiovascular: Positive for leg swelling.       Right lower extremity pain  Gastrointestinal: Negative.   Endocrine: Negative.   Genitourinary: Negative.   Musculoskeletal: Negative.   Skin: Positive for wound.  Allergic/Immunologic: Negative.   Neurological: Negative.   Hematological: Negative.   Psychiatric/Behavioral: Negative.       Objective:   Physical Exam  Constitutional: She is oriented to person, place, and time. She appears well-developed and well-nourished. No distress.  HENT:  Head: Normocephalic and atraumatic.  Right Ear: External ear normal.  Left Ear: External ear normal.  Eyes: Pupils are equal, round, and reactive to light. Conjunctivae and EOM are normal.  Neck: Normal range of motion.  Cardiovascular: Normal rate, regular rhythm, normal heart sounds and intact distal pulses.   Pulses:      Radial pulses are 2+ on the right side, and 2+ on the left side.  Hard to palpate pedal pulses  Pulmonary/Chest: Effort normal and breath sounds normal.  Musculoskeletal: Normal range of motion. She exhibits edema (Mild bilateral lower extremity edema).  Neurological: She is alert and oriented to person, place, and time.  Skin: She is not diaphoretic.  Right calf: 3 cm x 3 cm circular ulceration noted to the back of the right calf.  Surrounding tissue is healthy.  There is no cellulitis.  There is no necrotic tissue.  There is no drainage.  Psychiatric: She has a normal mood and affect. Her behavior is normal. Judgment and thought content normal.  Vitals reviewed.  There were no vitals taken for this visit.  Past Medical History:  Diagnosis Date  . Arthritis   . Blood clot in vein 2013   left leg  . Diabetes mellitus without complication ([place]) 1749  . Hypertension 1988  . IBS (irritable bowel syndrome)   . Myocardial infarction [facility])    Social History   Socioeconomic History  . Marital status: Married    Spouse name: Not on file  . Number of children: Not on file  . Years of education: Not on file  . Highest education level: Not on file  Occupational History  . Not on file  Social Needs  . Financial resource strain: Not on file  . Food insecurity:    Worry: Not on file    Inability: Not on file  . Transportation needs:  Medical: Not on file    Non-medical: Not on file  Tobacco Use  . Smoking status: Never Smoker  . Smokeless tobacco: Never Used  Substance and Sexual Activity  . Alcohol use: No  . Drug use: No  . Sexual activity: Not on file  Lifestyle  . Physical activity:    Days per week: Not on file    Minutes per session: Not on file  . Stress: Not on file  Relationships  . Social connections:    Talks on phone: Not on file    Gets together: Not on file    Attends religious service: Not on file    Active member of club or organization:  Not on file    Attends meetings of clubs or organizations: Not on file    Relationship status: Not on file  . Intimate partner violence:    Fear of current or ex partner: Not on file    Emotionally abused: Not on file    Physically abused: Not on file    Forced sexual activity: Not on file  Other Topics Concern  . Not on file  Social History Narrative  . Not on file   Past Surgical History:  Procedure Laterality Date  . ABDOMINAL HYSTERECTOMY  age 29   partial, still has ovaries and tubes  . APPENDECTOMY    . BACK SURGERY     screws and titanium  . BREAST BIOPSY     at age 19, benign  . CARPAL TUNNEL RELEASE Right 2010  . CARPAL TUNNEL RELEASE Left 07/25/2017   Procedure: CARPAL TUNNEL RELEASE;  Surgeon: Earnestine Leys, MD;  Location: [facility] ORS;  Service: Orthopedics;  Laterality: Left;  . CHOLECYSTECTOMY    . COLON SURGERY  1984  . COLONOSCOPY  2007, 2016   Dr Jamal Collin  . FOOT SURGERY    . FRACTURE SURGERY  2010   hip  . FRACTURE SURGERY  2009   femur  . JOINT REPLACEMENT     both knee  . PACEMAKER PLACEMENT  2013  . TONSILLECTOMY    . UPPER GI ENDOSCOPY  2016   Dr Jamal Collin   No family history on file.  Allergies  Allergen Reactions  . Sulphur [Sulfur] Hives    And seizure  . Phenergan [Promethazine] Other (See Comments)    Altered mental status      Assessment & Plan:  Patient presents for a monthly unna boot therapy / wound check.  The patient has been undergoing right lower extremity Unna wraps to the right lower extremity for a lymphedema exacerbation with blister formation to the back of the calf.  The blister formed after hitting the back of her right calf against a car door.  The blister has since popped and is now on ulcer.  The patient notes an improvement in her edema and slow healing to the ulcer site.  The patient is experiencing abnormal discomfort to the right lower extremity.  The patient notes progressively worse pain especially with ambulation to the  right lower extremity.  We have not assessed the patient's lower extremity arterial status in some time.  Patient denies any new/worsening ulcer formation.  Patient denies any erythema to the right lower extremity.  Patient denies any fever, nausea or vomiting.  1. Lower extremity pain, bilateral - New Patient presents today with progressively worsening right lower extremity pain especially with ambulation The patient has multiple risk factors for peripheral artery disease Hard to palpate pedal pulses on exam They  have not assess the patient's arterial status in some time I will order an ABI and a right lower extremity arterial duplex to assess for any contributing peripheral artery disease  - US Arterial Seg Multiple; Future - [place] Lower Ext Art Right; Future  2. Lymphedema - Stable This has improved with Unna boot treatment to the right lower extremity The patient is to continue to wear medical grade 1 compression sock to the left lower extremity I would continue the 3 layer zinc oxide Unna wrap therapy to the right lower extremity until the patient's ulceration is completely healed The patient is to continue to elevate her leg heart level or higher as much as possible The patient is to follow-up in 1 month for a wound assessment  3. Blister - Stable As above  Current Outpatient Medications on File Prior to Visit  Medication Sig Dispense Refill  . aspirin 81 MG tablet Take 81 mg by mouth daily.    . cholecalciferol (VITAMIN D) 1000 units tablet Take 1,000 Units by mouth daily.    . cyanocobalamin (,VITAMIN B-12,) 1000 MCG/ML injection Inject 1,000 mcg into the muscle every 30 (thirty) days.    Marland Kitchen diltiazem (DILACOR XR) 180 MG 24 hr capsule Take 180 mg by mouth daily.    . furosemide (LASIX) 80 MG tablet Take 80 mg by mouth 2 (two) times daily. In the morning & 4pm in the afternoon.    Marland Kitchen glipiZIDE (GLUCOTROL XL) 5 MG 24 hr tablet Take 5 mg by mouth daily.    Marland Kitchen HYDROcodone-acetaminophen  (NORCO) 5-325 MG tablet Take 1 tablet by mouth every 6 (six) hours as needed. 40 tablet 0  . HYDROcodone-acetaminophen (NORCO/VICODIN) 5-325 MG tablet Take 1 tablet by mouth every 4 (four) hours as needed (for pain.).     Marland Kitchen levothyroxine (SYNTHROID, LEVOTHROID) 112 MCG tablet Take 112 mcg by mouth daily before breakfast.    . metFORMIN (GLUCOPHAGE) 1000 MG tablet Take 1,000 mg by mouth 2 (two) times daily. With Breakfast & with supper    . nitroGLYCERIN (NITROSTAT) 0.4 MG SL tablet Place 0.4 mg under the tongue every 5 (five) minutes as needed for chest pain.    . potassium chloride SA (K-DUR,KLOR-CON) 20 MEQ tablet Take 20 mEq by mouth 2 (two) times daily.    . Pramox-PE-Glycerin-Petrolatum (PREPARATION H) 1-0.25-14.4-15 % CREA Place 1 application rectally 3 (three) times daily as needed (for rectal discomfort/hemmorroids.).    Marland Kitchen pregabalin (LYRICA) 100 MG capsule Take 100 mg by mouth 2 (two) times daily.    Marland Kitchen warfarin (COUMADIN) 6 MG tablet Take 6 mg by mouth daily with supper.     No current facility-administered medications on file prior to visit.    There are no Patient Instructions on file for this visit. No follow-ups on file.  KIMBERLY A STEGMAYER, PA-C

## 2017-11-21 ENCOUNTER — Encounter (INDEPENDENT_AMBULATORY_CARE_PROVIDER_SITE_OTHER): Payer: Medicare Other

## 2017-11-27 ENCOUNTER — Ambulatory Visit (INDEPENDENT_AMBULATORY_CARE_PROVIDER_SITE_OTHER): Payer: Medicare Other | Admitting: Vascular Surgery

## 2017-11-27 ENCOUNTER — Encounter (INDEPENDENT_AMBULATORY_CARE_PROVIDER_SITE_OTHER): Payer: Self-pay

## 2017-11-27 VITALS — BP 86/53 | HR 84 | Resp 15 | Ht 60.0 in | Wt 206.0 lb

## 2017-11-27 DIAGNOSIS — I890 Lymphedema, not elsewhere classified: Secondary | ICD-10-CM

## 2017-11-27 NOTE — Progress Notes (Signed)
History of Present Illness  There is no documented history at this time  Assessments & Plan   There are no diagnoses linked to this encounter.    Additional instructions  Subjective:  Patient presents with venous ulcer of the Right lower extremity.    Procedure:  3 layer unna wrap was placed Right lower extremity.   Plan:   Follow up in one week.   

## 2017-11-28 ENCOUNTER — Ambulatory Visit (INDEPENDENT_AMBULATORY_CARE_PROVIDER_SITE_OTHER): Payer: Medicare Other | Admitting: Vascular Surgery

## 2017-12-03 ENCOUNTER — Other Ambulatory Visit (INDEPENDENT_AMBULATORY_CARE_PROVIDER_SITE_OTHER): Payer: Self-pay

## 2017-12-03 DIAGNOSIS — M79604 Pain in right leg: Secondary | ICD-10-CM

## 2017-12-03 DIAGNOSIS — M79605 Pain in left leg: Principal | ICD-10-CM

## 2017-12-05 ENCOUNTER — Ambulatory Visit (INDEPENDENT_AMBULATORY_CARE_PROVIDER_SITE_OTHER): Payer: Medicare Other | Admitting: Vascular Surgery

## 2017-12-05 ENCOUNTER — Encounter (INDEPENDENT_AMBULATORY_CARE_PROVIDER_SITE_OTHER): Payer: Self-pay

## 2017-12-05 VITALS — BP 128/75 | HR 75 | Resp 16

## 2017-12-05 DIAGNOSIS — I890 Lymphedema, not elsewhere classified: Secondary | ICD-10-CM

## 2017-12-05 DIAGNOSIS — M7989 Other specified soft tissue disorders: Secondary | ICD-10-CM

## 2017-12-05 NOTE — Progress Notes (Signed)
History of Present Illness  There is no documented history at this time  Assessments & Plan   There are no diagnoses linked to this encounter.    Additional instructions  Subjective:  Patient presents with venous ulcer of the Right lower extremity.    Procedure:  3 layer unna wrap was placed Right lower extremity.   Plan:   Follow up in one week.   

## 2017-12-12 ENCOUNTER — Encounter (INDEPENDENT_AMBULATORY_CARE_PROVIDER_SITE_OTHER): Payer: Self-pay | Admitting: Vascular Surgery

## 2017-12-12 ENCOUNTER — Ambulatory Visit (INDEPENDENT_AMBULATORY_CARE_PROVIDER_SITE_OTHER): Payer: Medicare Other | Admitting: Vascular Surgery

## 2017-12-12 VITALS — BP 103/59 | HR 80 | Resp 16 | Ht 60.0 in | Wt 206.0 lb

## 2017-12-12 DIAGNOSIS — T148XXA Other injury of unspecified body region, initial encounter: Secondary | ICD-10-CM | POA: Diagnosis not present

## 2017-12-12 DIAGNOSIS — I890 Lymphedema, not elsewhere classified: Secondary | ICD-10-CM

## 2017-12-12 NOTE — Progress Notes (Signed)
Subjective:    Patient ID: Debra Butler, female    DOB: 1921-11-20, 84 y.o.   MRN: [id number] Chief Complaint  Patient presents with  . Follow-up    unna check   Patient presents for a monthly lymphedema / wound check / unna boot therapy follow-up.  The patient has been undergoing three layer zinc oxide unna wrap therapy to the right lower extremity for the last month.  The patient notes an improvement in her lymphedema and the blister located to the right shin has now healed.  The patient does have a lymphedema pump which she does not use.  The patient denies any worsening right lower extremity pain edema or new ulceration.  The patient denies any fever, nausea vomiting.  Review of Systems  Constitutional: Negative.   HENT: Negative.   Eyes: Negative.   Respiratory: Negative.   Cardiovascular: Negative.   Gastrointestinal: Negative.   Endocrine: Negative.   Genitourinary: Negative.   Musculoskeletal: Negative.   Skin: Negative.   Allergic/Immunologic: Negative.   Neurological: Negative.   Hematological: Negative.   Psychiatric/Behavioral: Negative.       Objective:   Physical Exam  Constitutional: She is oriented to person, place, and time. She appears well-developed and well-nourished. No distress.  HENT:  Head: Normocephalic and atraumatic.  Right Ear: External ear normal.  Left Ear: External ear normal.  Eyes: Pupils are equal, round, and reactive to light. Conjunctivae and EOM are normal.  Neck: Normal range of motion.  Cardiovascular: Normal rate, regular rhythm, normal heart sounds and intact distal pulses.  Pulses:      Radial pulses are 2+ on the right side, and 2+ on the left side.  Hard to palpate pedal pulses due to body habitus however her bilateral feet are warm.  Pulmonary/Chest: Effort normal and breath sounds normal.  Musculoskeletal: Normal range of motion. She exhibits no edema.  Neurological: She is alert and oriented to person, place, and time.    Skin: Skin is warm and dry. She is not diaphoretic.  Ulceration has healed  Psychiatric: She has a normal mood and affect. Her behavior is normal. Judgment and thought content normal.  Vitals reviewed.  BP (!) 103/59 (BP Location: Left Arm)   Pulse 80   Resp 16   Ht 5' (1.524 m)   Wt 206 lb (93.4 kg)   BMI 40.23 kg/m   Past Medical History:  Diagnosis Date  . Arthritis   . Blood clot in vein 2013   left leg  . Diabetes mellitus without complication ([place]) 0947  . Hypertension 1988  . IBS (irritable bowel syndrome)   . Myocardial infarction [facility])    Social History   Socioeconomic History  . Marital status: Married    Spouse name: Not on file  . Number of children: Not on file  . Years of education: Not on file  . Highest education level: Not on file  Occupational History  . Not on file  Social Needs  . Financial resource strain: Not on file  . Food insecurity:    Worry: Not on file    Inability: Not on file  . Transportation needs:    Medical: Not on file    Non-medical: Not on file  Tobacco Use  . Smoking status: Never Smoker  . Smokeless tobacco: Never Used  Substance and Sexual Activity  . Alcohol use: No  . Drug use: No  . Sexual activity: Not on file  Lifestyle  . Physical activity:  Days per week: Not on file    Minutes per session: Not on file  . Stress: Not on file  Relationships  . Social connections:    Talks on phone: Not on file    Gets together: Not on file    Attends religious service: Not on file    Active member of club or organization: Not on file    Attends meetings of clubs or organizations: Not on file    Relationship status: Not on file  . Intimate partner violence:    Fear of current or ex partner: Not on file    Emotionally abused: Not on file    Physically abused: Not on file    Forced sexual activity: Not on file  Other Topics Concern  . Not on file  Social History Narrative  . Not on file   Past Surgical History:   Procedure Laterality Date  . ABDOMINAL HYSTERECTOMY  age 78   partial, still has ovaries and tubes  . APPENDECTOMY    . BACK SURGERY     screws and titanium  . BREAST BIOPSY     at age 69, benign  . CARPAL TUNNEL RELEASE Right 2010  . CARPAL TUNNEL RELEASE Left 07/25/2017   Procedure: CARPAL TUNNEL RELEASE;  Surgeon: Earnestine Leys, MD;  Location: [facility] ORS;  Service: Orthopedics;  Laterality: Left;  . CHOLECYSTECTOMY    . COLON SURGERY  1984  . COLONOSCOPY  2007, 2016   Dr Jamal Collin  . FOOT SURGERY    . FRACTURE SURGERY  2010   hip  . FRACTURE SURGERY  2009   femur  . JOINT REPLACEMENT     both knee  . PACEMAKER PLACEMENT  2013  . TONSILLECTOMY    . UPPER GI ENDOSCOPY  2016   Dr Jamal Collin   No family history on file.  Allergies  Allergen Reactions  . Sulphur [Sulfur] Hives    And seizure  . Phenergan [Promethazine] Other (See Comments)    Altered mental status      Assessment & Plan:  Patient presents for a monthly lymphedema/wound check/Unna boot therapy follow-up.  The patient has been undergoing 3 layer zinc oxide unna wrap therapy to the right lower extremity for the last month.  The patient notes an improvement in her lymphedema and the blister located to the right shin has now healed.  The patient does have a lymphedema pump which she does not use.  The patient denies any worsening right lower extremity pain edema or new ulceration.  The patient denies any fever, nausea vomiting.  1. Lymphedema - Stable The patient has been treated with 3 layer zinc oxide Unna wraps to the right lower extremity for approximately 1 month for lymphedema exacerbation with ulcer formation. The patient now has gain control of her edema and her ulcer has healed It is okay to stop the zinc oxide Unna boot therapy and transition back into medical grade 1 compression socks The patient is to continue to elevate her legs on a daily basis during the day The patient is to use her lymphedema pump at  least twice a day for an hour each time The patient was instructed to call the office in the interim if any worsening edema or ulcerations to the legs, feet or toes occurs. The patient expresses their understanding. Patient is to follow-up PRN  2. Blister - Stable As above  Current Outpatient Medications on File Prior to Visit  Medication Sig Dispense Refill  . aspirin  81 MG tablet Take 81 mg by mouth daily.    . cholecalciferol (VITAMIN D) 1000 units tablet Take 1,000 Units by mouth daily.    . cyanocobalamin (,VITAMIN B-12,) 1000 MCG/ML injection Inject 1,000 mcg into the muscle every 30 (thirty) days.    Marland Kitchen diltiazem (DILACOR XR) 180 MG 24 hr capsule Take 180 mg by mouth daily.    . furosemide (LASIX) 80 MG tablet Take 80 mg by mouth 2 (two) times daily. In the morning & 4pm in the afternoon.    Marland Kitchen glipiZIDE (GLUCOTROL XL) 5 MG 24 hr tablet Take 5 mg by mouth daily.    Marland Kitchen HYDROcodone-acetaminophen (NORCO) 5-325 MG tablet Take 1 tablet by mouth every 6 (six) hours as needed. 40 tablet 0  . HYDROcodone-acetaminophen (NORCO/VICODIN) 5-325 MG tablet Take 1 tablet by mouth every 4 (four) hours as needed (for pain.).     Marland Kitchen levothyroxine (SYNTHROID, LEVOTHROID) 112 MCG tablet Take 112 mcg by mouth daily before breakfast.    . metFORMIN (GLUCOPHAGE) 1000 MG tablet Take 1,000 mg by mouth 2 (two) times daily. With Breakfast & with supper    . nitroGLYCERIN (NITROSTAT) 0.4 MG SL tablet Place 0.4 mg under the tongue every 5 (five) minutes as needed for chest pain.    . potassium chloride SA (K-DUR,KLOR-CON) 20 MEQ tablet Take 20 mEq by mouth 2 (two) times daily.    . Pramox-PE-Glycerin-Petrolatum (PREPARATION H) 1-0.25-14.4-15 % CREA Place 1 application rectally 3 (three) times daily as needed (for rectal discomfort/hemmorroids.).    Marland Kitchen pregabalin (LYRICA) 100 MG capsule Take 100 mg by mouth 2 (two) times daily.    Marland Kitchen warfarin (COUMADIN) 6 MG tablet Take 6 mg by mouth daily with supper.     No current  facility-administered medications on file prior to visit.    There are no Patient Instructions on file for this visit. No follow-ups on file.  Kloee Ballew A Aurora Rody, PA-C

## 2017-12-13 ENCOUNTER — Telehealth (INDEPENDENT_AMBULATORY_CARE_PROVIDER_SITE_OTHER): Payer: Self-pay

## 2017-12-14 ENCOUNTER — Ambulatory Visit (INDEPENDENT_AMBULATORY_CARE_PROVIDER_SITE_OTHER): Payer: Medicare Other | Admitting: Vascular Surgery

## 2017-12-14 ENCOUNTER — Encounter (INDEPENDENT_AMBULATORY_CARE_PROVIDER_SITE_OTHER): Payer: Self-pay

## 2017-12-14 VITALS — BP 114/64 | HR 76 | Resp 16 | Ht 60.0 in

## 2017-12-14 DIAGNOSIS — M7989 Other specified soft tissue disorders: Secondary | ICD-10-CM

## 2017-12-14 DIAGNOSIS — R600 Localized edema: Secondary | ICD-10-CM

## 2017-12-14 NOTE — Progress Notes (Signed)
History of Present Illness  There is no documented history at this time  Assessments & Plan   There are no diagnoses linked to this encounter.    Additional instructions  Subjective:  Patient presents with venous ulcer of the Right lower extremity.    Procedure:  3 layer unna wrap was placed Right lower extremity.   Plan:   Follow up in one week.   

## 2017-12-14 NOTE — Telephone Encounter (Signed)
Patient will be coming in today to get right leg unna boot

## 2017-12-19 ENCOUNTER — Encounter (INDEPENDENT_AMBULATORY_CARE_PROVIDER_SITE_OTHER): Payer: Medicare Other

## 2017-12-19 ENCOUNTER — Encounter (INDEPENDENT_AMBULATORY_CARE_PROVIDER_SITE_OTHER): Payer: Self-pay

## 2017-12-19 ENCOUNTER — Ambulatory Visit (INDEPENDENT_AMBULATORY_CARE_PROVIDER_SITE_OTHER): Payer: Medicare Other | Admitting: Vascular Surgery

## 2017-12-19 VITALS — BP 108/58 | HR 70 | Resp 15 | Ht 60.0 in

## 2017-12-19 DIAGNOSIS — I890 Lymphedema, not elsewhere classified: Secondary | ICD-10-CM

## 2017-12-19 NOTE — Progress Notes (Signed)
History of Present Illness  There is no documented history at this time  Assessments & Plan   There are no diagnoses linked to this encounter.    Additional instructions  Subjective:  Patient presents with venous ulcer of the Right lower extremity.    Procedure:  3 layer unna wrap was placed Right lower extremity.   Plan:   Follow up in one week.   

## 2017-12-24 ENCOUNTER — Telehealth (INDEPENDENT_AMBULATORY_CARE_PROVIDER_SITE_OTHER): Payer: Self-pay

## 2017-12-24 NOTE — Telephone Encounter (Signed)
Patient called stating that the unna wrap has slided down on her right leg and is wet also,She stated that she is elevating but she would like to come in the office and have it change.I ask if she could come in today but she stated she can come in tomorrow because she will have someone to drive her.The patient is schedule to come 12/25/17 for a unna wrap.

## 2017-12-25 ENCOUNTER — Ambulatory Visit (INDEPENDENT_AMBULATORY_CARE_PROVIDER_SITE_OTHER): Payer: Medicare Other | Admitting: Vascular Surgery

## 2017-12-25 ENCOUNTER — Encounter (INDEPENDENT_AMBULATORY_CARE_PROVIDER_SITE_OTHER): Payer: Self-pay

## 2017-12-25 VITALS — BP 116/78 | HR 78 | Resp 16 | Ht 60.0 in | Wt 210.0 lb

## 2017-12-25 DIAGNOSIS — I890 Lymphedema, not elsewhere classified: Secondary | ICD-10-CM

## 2017-12-25 NOTE — Progress Notes (Signed)
History of Present Illness  There is no documented history at this time  Assessments & Plan   There are no diagnoses linked to this encounter.    Additional instructions  Subjective:  Patient presents with venous ulcer of the Right lower extremity.    Procedure:  3 layer unna wrap was placed Right lower extremity.   Plan:   Follow up in one week.   

## 2017-12-26 ENCOUNTER — Encounter (INDEPENDENT_AMBULATORY_CARE_PROVIDER_SITE_OTHER): Payer: Medicare Other

## 2018-01-01 ENCOUNTER — Ambulatory Visit
Admission: RE | Admit: 2018-01-01 | Discharge: 2018-01-01 | Disposition: A | Discharge status: Home / Self Care | Payer: Medicare Other | Source: Ambulatory Visit | Attending: Vascular Surgery | Admitting: Vascular Surgery

## 2018-01-01 ENCOUNTER — Encounter (INDEPENDENT_AMBULATORY_CARE_PROVIDER_SITE_OTHER): Payer: Self-pay

## 2018-01-01 ENCOUNTER — Ambulatory Visit (INDEPENDENT_AMBULATORY_CARE_PROVIDER_SITE_OTHER): Payer: Medicare Other | Admitting: Vascular Surgery

## 2018-01-01 VITALS — BP 122/63 | HR 75 | Resp 16 | Ht 60.0 in | Wt 210.0 lb

## 2018-01-01 DIAGNOSIS — M79604 Pain in right leg: Secondary | ICD-10-CM | POA: Diagnosis present

## 2018-01-01 DIAGNOSIS — R9389 Abnormal findings on diagnostic imaging of other specified body structures: Secondary | ICD-10-CM | POA: Insufficient documentation

## 2018-01-01 DIAGNOSIS — M79605 Pain in left leg: Secondary | ICD-10-CM | POA: Diagnosis present

## 2018-01-01 DIAGNOSIS — I890 Lymphedema, not elsewhere classified: Secondary | ICD-10-CM | POA: Diagnosis not present

## 2018-01-01 NOTE — Progress Notes (Signed)
History of Present Illness  There is no documented history at this time  Assessments & Plan   There are no diagnoses linked to this encounter.    Additional instructions  Subjective:  Patient presents with venous ulcer of the Right lower extremity.    Procedure:  3 layer unna wrap was placed Right lower extremity.   Plan:   Follow up in one week.   

## 2018-01-02 ENCOUNTER — Encounter (INDEPENDENT_AMBULATORY_CARE_PROVIDER_SITE_OTHER): Payer: Medicare Other

## 2018-01-09 ENCOUNTER — Encounter (INDEPENDENT_AMBULATORY_CARE_PROVIDER_SITE_OTHER): Payer: Self-pay | Admitting: Vascular Surgery

## 2018-01-09 ENCOUNTER — Ambulatory Visit (INDEPENDENT_AMBULATORY_CARE_PROVIDER_SITE_OTHER): Payer: Medicare Other | Admitting: Vascular Surgery

## 2018-01-09 VITALS — BP 123/74 | HR 68 | Resp 16 | Ht 60.0 in | Wt 210.0 lb

## 2018-01-09 DIAGNOSIS — I10 Essential (primary) hypertension: Secondary | ICD-10-CM | POA: Diagnosis not present

## 2018-01-09 DIAGNOSIS — I890 Lymphedema, not elsewhere classified: Secondary | ICD-10-CM

## 2018-01-09 DIAGNOSIS — T148XXA Other injury of unspecified body region, initial encounter: Secondary | ICD-10-CM

## 2018-01-09 NOTE — Progress Notes (Signed)
Subjective:    Patient ID: Debra Butler, female    DOB: July 21, 1921, 84 y.o.   MRN: [id number] Chief Complaint  Patient presents with  . Follow-up    right leg unna check    HPI   Patient is seen for follow up evaluation of leg pain and swelling associated with venous ulceration. The patient was recently seen here and started on Unna boot therapy.  The swelling has continued and the right is greater than left.  The pain and swelling worsens with prolonged dependency and improves with elevation.  The patient notes that in the morning the legs are better but the leg symptoms worsened throughout the course of the day.   The patient notes that her ulcer has improved, however is not fully healed yet.   There is a moderate amount of drainage associated with the open area.  The wound is also very painful.  The patient states that they have been elevating as much as possible. The patient denies any recent changes in medications.   The patient denies a history of DVT or PE. There is no prior history of phlebitis. There is no history of primary lymphedema.  No SOB or increased cough.  No sputum production.  No recent episodes of CHF exacerbation.     Constitutional: [] Weight loss  [] Fever  [] Chills Cardiac: [] Chest pain   [] Chest pressure   [] Palpitations   [] Shortness of breath when laying flat   [] Shortness of breath with exertion. Vascular:  [] Pain in legs with walking   [] Pain in legs with standing  [] History of DVT   [] Phlebitis   [x] Swelling in legs   [] Varicose veins   [x] Non-healing ulcers Pulmonary:   [] Uses home oxygen   [] Productive cough   [] Hemoptysis   [] Wheeze  [] COPD   [] Asthma Neurologic:  [] Dizziness   [] Seizures   [] History of stroke   [] History of TIA  [] Aphasia   [] Vissual changes   [] Weakness or numbness in arm   [] Weakness or numbness in leg Musculoskeletal:   [] Joint swelling   [] Joint pain   [] Low back pain Hematologic:  [] Easy bruising  [] Easy bleeding    [] Hypercoagulable state   [] Anemic Gastrointestinal:  [] Diarrhea   [] Vomiting  [] Gastroesophageal reflux/heartburn   [] Difficulty swallowing. Genitourinary:  [] Chronic kidney disease   [] Difficult urination  [] Frequent urination   [] Blood in urine Skin:  [] Rashes   [] Ulcers  Psychological:  [] History of anxiety   []  History of major depression.     Objective:   Physical Exam  BP 123/74 (BP Location: Left Arm)   Pulse 68   Resp 16   Ht 5' (1.524 m)   Wt 210 lb (95.3 kg)   BMI 41.01 kg/m   Past Medical History:  Diagnosis Date  . Arthritis   . Blood clot in vein 2013   left leg  . Diabetes mellitus without complication ([place]) 8891  . Hypertension 1988  . IBS (irritable bowel syndrome)   . Myocardial infarction ([place])      Gen: WD/WN, NAD Head: [place]/AT, No temporalis wasting.  Ear/Nose/Throat: Hearing grossly intact, nares w/o erythema or drainage, poor dentition Eyes: PER, EOMI, sclera nonicteric.  Neck: Supple, no masses.  No bruit or JVD.  Pulmonary:  Good air movement, clear to auscultation bilaterally, no use of accessory muscles.  Cardiac: RRR, normal S1, S2,  Vascular:  Vessel Right Left  Radial 2+ 2+  DP Hard to palpate Hard to palpate  Gastrointestinal: soft, non-distended. No guarding/no peritoneal  signs.  Musculoskeletal: M/S 5/5 throughout.  No deformity or atrophy.  Neurologic: CN 2-12 intact. Pain and light touch intact in extremities.  Symmetrical.  Speech is fluent. Motor exam as listed above. Psychiatric: Judgment intact, Mood & affect appropriate for pt's clinical situation. Dermatologic:  ulcers noted.  No changes consistent with cellulitis. Lymph : No Cervical lymphadenopathy, no lichenification or skin changes of chronic lymphedema.         Social History   Socioeconomic History  . Marital status: Married    Spouse name: Not on file  . Number of children: Not on file  . Years of education: Not on file  . Highest education level: Not on file    Occupational History  . Not on file  Social Needs  . Financial resource strain: Not on file  . Food insecurity:    Worry: Not on file    Inability: Not on file  . Transportation needs:    Medical: Not on file    Non-medical: Not on file  Tobacco Use  . Smoking status: Never Smoker  . Smokeless tobacco: Never Used  Substance and Sexual Activity  . Alcohol use: No  . Drug use: No  . Sexual activity: Not on file  Lifestyle  . Physical activity:    Days per week: Not on file    Minutes per session: Not on file  . Stress: Not on file  Relationships  . Social connections:    Talks on phone: Not on file    Gets together: Not on file    Attends religious service: Not on file    Active member of club or organization: Not on file    Attends meetings of clubs or organizations: Not on file    Relationship status: Not on file  . Intimate partner violence:    Fear of current or ex partner: Not on file    Emotionally abused: Not on file    Physically abused: Not on file    Forced sexual activity: Not on file  Other Topics Concern  . Not on file  Social History Narrative  . Not on file    Past Surgical History:  Procedure Laterality Date  . ABDOMINAL HYSTERECTOMY  age 18   partial, still has ovaries and tubes  . APPENDECTOMY    . BACK SURGERY     screws and titanium  . BREAST BIOPSY     at age 78, benign  . CARPAL TUNNEL RELEASE Right 2010  . CARPAL TUNNEL RELEASE Left 07/25/2017   Procedure: CARPAL TUNNEL RELEASE;  Surgeon: Earnestine Leys, MD;  Location: [facility] ORS;  Service: Orthopedics;  Laterality: Left;  . CHOLECYSTECTOMY    . COLON SURGERY  1984  . COLONOSCOPY  2007, 2016   Dr Jamal Collin  . FOOT SURGERY    . FRACTURE SURGERY  2010   hip  . FRACTURE SURGERY  2009   femur  . JOINT REPLACEMENT     both knee  . PACEMAKER PLACEMENT  2013  . TONSILLECTOMY    . UPPER GI ENDOSCOPY  2016   Dr Jamal Collin    No family history on file.  Allergies  Allergen Reactions  .  Sulphur [Sulfur] Hives    And seizure  . Phenergan [Promethazine] Other (See Comments)    Altered mental status       Assessment & Plan:   1. Lymphedema-stable  No surgery or intervention at this point in time.    I have had a long  discussion with the patient regarding venous insufficiency and why it  causes symptoms, specifically venous ulceration . I have discussed with the patient the chronic skin changes that accompany venous insufficiency and the long term sequela such as infection and recurring  ulceration.  Patient will be placed in [employer] which will be changed weekly drainage permitting.  In addition, behavioral modification including several periods of elevation of the lower extremities during the day will be continued. Achieving a position with the ankles at heart level was stressed to the patient  The patient has a lymph pump but has admitted to not using it.  I have stressed that usage of her lymph pump will help her wound heal faster as the increase swelling will delay her wound healing.  The patient states she understands and she will attempt to find her lymph pump from storage.  2. Blister-stable  See above  3. Essential hypertension-stable  Continue antihypertensive medications as already ordered, these medications have been reviewed and there are no changes at this time.    Current Outpatient Medications on File Prior to Visit  Medication Sig Dispense Refill  . aspirin 81 MG tablet Take 81 mg by mouth daily.    . cholecalciferol (VITAMIN D) 1000 units tablet Take 1,000 Units by mouth daily.    . cyanocobalamin (,VITAMIN B-12,) 1000 MCG/ML injection Inject 1,000 mcg into the muscle every 30 (thirty) days.    Marland Kitchen diltiazem (DILACOR XR) 180 MG 24 hr capsule Take 180 mg by mouth daily.    . furosemide (LASIX) 80 MG tablet Take 80 mg by mouth 2 (two) times daily. In the morning & 4pm in the afternoon.    Marland Kitchen glipiZIDE (GLUCOTROL XL) 5 MG 24 hr tablet Take 5 mg by mouth  daily.    Marland Kitchen HYDROcodone-acetaminophen (NORCO) 5-325 MG tablet Take 1 tablet by mouth every 6 (six) hours as needed. 40 tablet 0  . HYDROcodone-acetaminophen (NORCO/VICODIN) 5-325 MG tablet Take 1 tablet by mouth every 4 (four) hours as needed (for pain.).     Marland Kitchen levothyroxine (SYNTHROID, LEVOTHROID) 112 MCG tablet Take 112 mcg by mouth daily before breakfast.    . metFORMIN (GLUCOPHAGE) 1000 MG tablet Take 1,000 mg by mouth 2 (two) times daily. With Breakfast & with supper    . nitroGLYCERIN (NITROSTAT) 0.4 MG SL tablet Place 0.4 mg under the tongue every 5 (five) minutes as needed for chest pain.    . potassium chloride SA (K-DUR,KLOR-CON) 20 MEQ tablet Take 20 mEq by mouth 2 (two) times daily.    . Pramox-PE-Glycerin-Petrolatum (PREPARATION H) 1-0.25-14.4-15 % CREA Place 1 application rectally 3 (three) times daily as needed (for rectal discomfort/hemmorroids.).    Marland Kitchen pregabalin (LYRICA) 100 MG capsule Take 100 mg by mouth 2 (two) times daily.    Marland Kitchen warfarin (COUMADIN) 6 MG tablet Take 6 mg by mouth daily with supper.     No current facility-administered medications on file prior to visit.     There are no Patient Instructions on file for this visit. No follow-ups on file.   Kris Hartmann, NP

## 2018-01-16 ENCOUNTER — Encounter (INDEPENDENT_AMBULATORY_CARE_PROVIDER_SITE_OTHER): Payer: Self-pay

## 2018-01-16 ENCOUNTER — Ambulatory Visit (INDEPENDENT_AMBULATORY_CARE_PROVIDER_SITE_OTHER): Payer: Medicare Other | Admitting: Nurse Practitioner

## 2018-01-16 VITALS — BP 112/65 | HR 62 | Resp 16 | Ht 60.0 in | Wt 210.0 lb

## 2018-01-16 DIAGNOSIS — I890 Lymphedema, not elsewhere classified: Secondary | ICD-10-CM

## 2018-01-16 NOTE — Progress Notes (Signed)
History of Present Illness  There is no documented history at this time  Assessments & Plan   There are no diagnoses linked to this encounter.    Additional instructions  Subjective:  Patient presents with venous ulcer of the Right lower extremity.    Procedure:  3 layer unna wrap was placed Right lower extremity.   Plan:   Follow up in one week.   

## 2018-01-23 ENCOUNTER — Encounter (INDEPENDENT_AMBULATORY_CARE_PROVIDER_SITE_OTHER): Payer: Self-pay

## 2018-01-23 ENCOUNTER — Ambulatory Visit (INDEPENDENT_AMBULATORY_CARE_PROVIDER_SITE_OTHER): Payer: Medicare Other | Admitting: Nurse Practitioner

## 2018-01-23 VITALS — BP 118/72 | HR 74 | Resp 16 | Ht 60.0 in

## 2018-01-23 DIAGNOSIS — R600 Localized edema: Secondary | ICD-10-CM

## 2018-01-23 NOTE — Progress Notes (Signed)
History of Present Illness  There is no documented history at this time  Assessments & Plan   There are no diagnoses linked to this encounter.    Additional instructions  Subjective:  Patient presents with venous ulcer of the Right lower extremity.    Procedure:  3 layer unna wrap was placed Right lower extremity.   Plan:   Follow up in one week.   

## 2018-01-30 ENCOUNTER — Ambulatory Visit (INDEPENDENT_AMBULATORY_CARE_PROVIDER_SITE_OTHER): Payer: Medicare Other | Admitting: Nurse Practitioner

## 2018-01-30 ENCOUNTER — Encounter (INDEPENDENT_AMBULATORY_CARE_PROVIDER_SITE_OTHER): Payer: Self-pay

## 2018-01-30 VITALS — BP 132/70 | HR 68 | Resp 15 | Ht 61.0 in | Wt 210.0 lb

## 2018-01-30 DIAGNOSIS — M7989 Other specified soft tissue disorders: Secondary | ICD-10-CM

## 2018-01-30 DIAGNOSIS — R600 Localized edema: Secondary | ICD-10-CM

## 2018-01-30 DIAGNOSIS — T148XXA Other injury of unspecified body region, initial encounter: Secondary | ICD-10-CM

## 2018-01-30 NOTE — Progress Notes (Signed)
History of Present Illness  There is no documented history at this time  Assessments & Plan   There are no diagnoses linked to this encounter.    Additional instructions  Subjective:  Patient presents with venous ulcer of the Right lower extremity.    Procedure:  3 layer unna wrap was placed Right lower extremity.   Plan:   Follow up in one week.   

## 2018-02-05 ENCOUNTER — Ambulatory Visit (INDEPENDENT_AMBULATORY_CARE_PROVIDER_SITE_OTHER): Payer: Medicare Other | Admitting: Vascular Surgery

## 2018-02-06 ENCOUNTER — Ambulatory Visit (INDEPENDENT_AMBULATORY_CARE_PROVIDER_SITE_OTHER): Payer: Medicare Other | Admitting: Vascular Surgery

## 2018-02-06 ENCOUNTER — Encounter (INDEPENDENT_AMBULATORY_CARE_PROVIDER_SITE_OTHER): Payer: Self-pay

## 2018-02-06 ENCOUNTER — Encounter (INDEPENDENT_AMBULATORY_CARE_PROVIDER_SITE_OTHER): Payer: Medicare Other

## 2018-05-07 ENCOUNTER — Ambulatory Visit (INDEPENDENT_AMBULATORY_CARE_PROVIDER_SITE_OTHER): Payer: Medicare Other | Admitting: Nurse Practitioner

## 2018-05-07 ENCOUNTER — Encounter (INDEPENDENT_AMBULATORY_CARE_PROVIDER_SITE_OTHER): Payer: Self-pay | Admitting: Nurse Practitioner

## 2018-05-07 VITALS — BP 137/89 | HR 93 | Ht <= 58 in | Wt 194.0 lb

## 2018-05-07 DIAGNOSIS — I890 Lymphedema, not elsewhere classified: Secondary | ICD-10-CM | POA: Diagnosis not present

## 2018-05-07 DIAGNOSIS — L03119 Cellulitis of unspecified part of limb: Secondary | ICD-10-CM | POA: Diagnosis not present

## 2018-05-07 DIAGNOSIS — S81811A Laceration without foreign body, right lower leg, initial encounter: Secondary | ICD-10-CM | POA: Diagnosis not present

## 2018-05-07 DIAGNOSIS — I10 Essential (primary) hypertension: Secondary | ICD-10-CM | POA: Diagnosis not present

## 2018-05-07 MED ORDER — CEPHALEXIN 500 MG PO CAPS: 500.0000 mg | ORAL_CAPSULE | Freq: Two times a day (BID) | ORAL | 0 refills | Status: DC

## 2018-05-08 ENCOUNTER — Encounter (INDEPENDENT_AMBULATORY_CARE_PROVIDER_SITE_OTHER): Payer: Self-pay | Admitting: Nurse Practitioner

## 2018-05-08 NOTE — Progress Notes (Signed)
Subjective:    Patient ID: Debra Butler, female    DOB: [birth year], 84 y.o.   MRN: [id number] Chief Complaint  Patient presents with  . Follow-up    Bleeding from RL    HPI  Debra Butler is a 84 y.o. female that presents to the office with complaint of bleeding from her right lower extremity.  Patient initially called to say that there was a fall which caused her bleeding, however once in the office she stated that she felt something sharp rub against her leg and she suddenly began to bleed.  Upon assessment it appears to be a skin tear.  The patient states that it is very tender to the touch as is her entire leg.  She states that her left leg is tender to the touch as well.  She states that she has had little blisters that come up on her legs which she treats with over-the-counter antibiotic ointment.  The patient states that she has been wearing her compression stockings daily however the stockings have now began to cut into her upper leg and cause her significant pain.  The patient denies any fever, chills, nausea, vomiting or diarrhea.  Patient denies any chest pain or shortness of breath.  She denies any TIA-like symptoms or shortness of breath.  The patient is also present with her caretaker.  Past Medical History:  Diagnosis Date  . Arthritis   . Blood clot in vein 2013   left leg  . Diabetes mellitus without complication ([place]) 0938  . Hypertension 1988  . IBS (irritable bowel syndrome)   . Myocardial infarction [facility])     Past Surgical History:  Procedure Laterality Date  . ABDOMINAL HYSTERECTOMY  age 89   partial, still has ovaries and tubes  . APPENDECTOMY    . BACK SURGERY     screws and titanium  . BREAST BIOPSY     at age 87, benign  . CARPAL TUNNEL RELEASE Right 2010  . CARPAL TUNNEL RELEASE Left 07/25/2017   Procedure: CARPAL TUNNEL RELEASE;  Surgeon: Earnestine Leys, MD;  Location: [facility] ORS;  Service: Orthopedics;  Laterality: Left;  . CHOLECYSTECTOMY      . COLON SURGERY  1984  . COLONOSCOPY  2007, 2016   Dr Jamal Collin  . FOOT SURGERY    . FRACTURE SURGERY  2010   hip  . FRACTURE SURGERY  2009   femur  . JOINT REPLACEMENT     both knee  . PACEMAKER PLACEMENT  2013  . TONSILLECTOMY    . UPPER GI ENDOSCOPY  2016   Dr Jamal Collin    Social History   Socioeconomic History  . Marital status: Married    Spouse name: Not on file  . Number of children: Not on file  . Years of education: Not on file  . Highest education level: Not on file  Occupational History  . Not on file  Social Needs  . Financial resource strain: Not on file  . Food insecurity:    Worry: Not on file    Inability: Not on file  . Transportation needs:    Medical: Not on file    Non-medical: Not on file  Tobacco Use  . Smoking status: Never Smoker  . Smokeless tobacco: Never Used  Substance and Sexual Activity  . Alcohol use: No  . Drug use: No  . Sexual activity: Not on file  Lifestyle  . Physical activity:    Days per week: Not on  file    Minutes per session: Not on file  . Stress: Not on file  Relationships  . Social connections:    Talks on phone: Not on file    Gets together: Not on file    Attends religious service: Not on file    Active member of club or organization: Not on file    Attends meetings of clubs or organizations: Not on file    Relationship status: Not on file  . Intimate partner violence:    Fear of current or ex partner: Not on file    Emotionally abused: Not on file    Physically abused: Not on file    Forced sexual activity: Not on file  Other Topics Concern  . Not on file  Social History Narrative  . Not on file    History reviewed. No pertinent family history.  Allergies  Allergen Reactions  . Sulphur [Sulfur] Hives    And seizure  . Phenergan [Promethazine] Other (See Comments)    Altered mental status     Review of Systems   Review of Systems: Negative Unless Checked Constitutional: [] Weight loss  [] Fever   [] Chills Cardiac: [] Chest pain   []  Atrial Fibrillation  [] Palpitations   [] Shortness of breath when laying flat   [] Shortness of breath with exertion. Vascular:  [] Pain in legs with walking   [] Pain in legs with standing  [] History of DVT   [] Phlebitis   [x] Swelling in legs   [x] Varicose veins   [] Non-healing ulcers Pulmonary:   [] Uses home oxygen   [] Productive cough   [] Hemoptysis   [] Wheeze  [] COPD   [] Asthma Neurologic:  [] Dizziness   [] Seizures   [] History of stroke   [] History of TIA  [] Aphasia   [] Vissual changes   [] Weakness or numbness in arm   [x] Weakness or numbness in leg Musculoskeletal:   [] Joint swelling   [] Joint pain   [] Low back pain  []  History of Knee Replacement Hematologic:  [x] Easy bruising  [x] Easy bleeding   [] Hypercoagulable state   [] Anemic Gastrointestinal:  [] Diarrhea   [] Vomiting  [] Gastroesophageal reflux/heartburn   [] Difficulty swallowing. Genitourinary:  [] Chronic kidney disease   [] Difficult urination  [] Anuric   [] Blood in urine Skin:  [x] Rashes   [] Ulcers  Psychological:  [] History of anxiety   []  History of major depression  []  Memory Difficulties     Objective:   Physical Exam  BP 137/89 (BP Location: Right Arm, Patient Position: Sitting)   Pulse 93   Ht 4\' 4"  (1.321 m)   Wt 194 lb (88 kg)   SpO2 (!) 18%   BMI 50.44 kg/m   Gen: WD/WN, NAD Head: [place]/AT, No temporalis wasting.  Ear/Nose/Throat: Hearing grossly intact, nares w/o erythema or drainage Eyes: PER, EOMI, sclera nonicteric.  Neck: Supple, no masses.  No JVD.  Pulmonary:  Good air movement, no use of accessory muscles.  Cardiac: RRR Vascular:  Bilateral 2+ pitting edema Vessel Right Left  Radial Palpable Palpable   Gastrointestinal: soft, non-distended. No guarding/no peritoneal signs.  Musculoskeletal: Uses wheelchair for transportation. No deformity or atrophy.  Neurologic: Pain and light touch intact in extremities.  Symmetrical.  Speech is fluent. Motor exam as listed  above. Psychiatric: Judgment intact, Mood & affect appropriate for pt's clinical situation. Dermatologic:  Large quarter sized skin tear on right anterior calf, leg is red tender to touch.  Several purulent blisters present. Lymph : No Cervical lymphadenopathy, no lichenification or skin changes of chronic lymphedema.  Assessment & Plan:   1. Essential hypertension Continue antihypertensive medications as already ordered, these medications have been reviewed and there are no changes at this time.   2. Lymphedema Patient currently been wearing compression stockings, however I suspect that the stockings that she was wearing were not doing very much compression as she was very edematous today and there was evidence of multiple blisters.  As mentioned below we will place her in bilateral Unna wrap to gain control of her swelling as well as to help prevent further recurrence of cellulitis.  We will wrap her weekly and reassess within 4 weeks to assess the amount of swelling as well as her skin tear.  3. Cellulitis of lower extremity, unspecified laterality Patient's legs bilaterally were very red very tender to the touch.  She also had several blisters, some filled with purulent drainage.  We will treat with a short course of antibiotics to assist with cellulitis. - cephALEXin (KEFLEX) 500 MG capsule; Take 1 capsule (500 mg total) by mouth 2 (two) times daily.  Dispense: 14 capsule; Refill: 0  4. Skin tear of lower leg without complication, right, initial encounter Patient initially came to [place] due to her skin tear.  On presentation in the office it has ceased bleeding.  Swelling likely contributed to skin tear.  Patient placed in bilateral Unna wraps to assist with skin healing as well as edema.   Current Outpatient Medications on File Prior to Visit  Medication Sig Dispense Refill  . aspirin 81 MG tablet Take 81 mg by mouth daily.    . cholecalciferol (VITAMIN D) 1000 units tablet Take  1,000 Units by mouth daily.    . cyanocobalamin (,VITAMIN B-12,) 1000 MCG/ML injection Inject 1,000 mcg into the muscle every 30 (thirty) days.    . furosemide (LASIX) 80 MG tablet Take 80 mg by mouth 2 (two) times daily. In the morning & 4pm in the afternoon.    Marland Kitchen glipiZIDE (GLUCOTROL XL) 5 MG 24 hr tablet Take 5 mg by mouth daily.    Marland Kitchen HYDROcodone-acetaminophen (NORCO) 5-325 MG tablet Take 1 tablet by mouth every 6 (six) hours as needed. 40 tablet 0  . levothyroxine (SYNTHROID, LEVOTHROID) 112 MCG tablet Take 112 mcg by mouth daily before breakfast.    . metFORMIN (GLUCOPHAGE) 1000 MG tablet Take 1,000 mg by mouth 2 (two) times daily. With Breakfast & with supper    . nitroGLYCERIN (NITROSTAT) 0.4 MG SL tablet Place 0.4 mg under the tongue every 5 (five) minutes as needed for chest pain.    . potassium chloride SA (K-DUR,KLOR-CON) 20 MEQ tablet Take 20 mEq by mouth 2 (two) times daily.    . Pramox-PE-Glycerin-Petrolatum (PREPARATION H) 1-0.25-14.4-15 % CREA Place 1 application rectally 3 (three) times daily as needed (for rectal discomfort/hemmorroids.).    Marland Kitchen pregabalin (LYRICA) 100 MG capsule Take 100 mg by mouth 2 (two) times daily.    Marland Kitchen warfarin (COUMADIN) 6 MG tablet Take 6 mg by mouth daily with supper.     No current facility-administered medications on file prior to visit.     There are no Patient Instructions on file for this visit. Return in about 1 week (around 05/14/2018).   Kris Hartmann, NP  This note was completed with Sales executive.  Any errors are purely unintentional.

## 2018-05-14 ENCOUNTER — Encounter (INDEPENDENT_AMBULATORY_CARE_PROVIDER_SITE_OTHER): Payer: Medicare Other | Admitting: Nurse Practitioner

## 2018-05-14 ENCOUNTER — Encounter (INDEPENDENT_AMBULATORY_CARE_PROVIDER_SITE_OTHER): Payer: Self-pay

## 2018-05-14 ENCOUNTER — Ambulatory Visit (INDEPENDENT_AMBULATORY_CARE_PROVIDER_SITE_OTHER): Payer: Medicare Other | Admitting: Nurse Practitioner

## 2018-05-14 VITALS — BP 108/69 | HR 87 | Resp 18 | Ht <= 58 in | Wt 191.0 lb

## 2018-05-14 DIAGNOSIS — I890 Lymphedema, not elsewhere classified: Secondary | ICD-10-CM

## 2018-05-14 NOTE — Progress Notes (Signed)
..  History of Present Illness  There is no documented history at this time  Assessments & Plan   There are no diagnoses linked to this encounter.    Additional instructions  Subjective:  Patient presents with venous ulcer of the right lower extremity.    Procedure:  3 layer unna wrap was placed right lower extremity.   Plan:   Follow up in one week.  

## 2018-05-20 ENCOUNTER — Encounter (INDEPENDENT_AMBULATORY_CARE_PROVIDER_SITE_OTHER): Payer: Self-pay | Admitting: Nurse Practitioner

## 2018-05-20 ENCOUNTER — Ambulatory Visit (INDEPENDENT_AMBULATORY_CARE_PROVIDER_SITE_OTHER): Payer: Medicare Other | Admitting: Nurse Practitioner

## 2018-05-20 VITALS — BP 130/81 | HR 96 | Wt 197.0 lb

## 2018-05-20 DIAGNOSIS — M7989 Other specified soft tissue disorders: Secondary | ICD-10-CM

## 2018-05-20 NOTE — Progress Notes (Signed)
History of Present Illness  There is no documented history at this time  Assessments & Plan   There are no diagnoses linked to this encounter.    Additional instructions  Subjective:  Patient presents with venous ulcer of the Left lower extremity.    Procedure:  3 layer unna wrap was placed Left lower extremity.   Plan:   Follow up in one week.  

## 2018-05-28 ENCOUNTER — Encounter (INDEPENDENT_AMBULATORY_CARE_PROVIDER_SITE_OTHER): Payer: Self-pay

## 2018-05-28 ENCOUNTER — Ambulatory Visit (INDEPENDENT_AMBULATORY_CARE_PROVIDER_SITE_OTHER): Payer: Medicare Other | Admitting: Nurse Practitioner

## 2018-05-28 VITALS — BP 134/83 | HR 69 | Resp 18 | Wt 197.0 lb

## 2018-05-28 DIAGNOSIS — I890 Lymphedema, not elsewhere classified: Secondary | ICD-10-CM | POA: Diagnosis not present

## 2018-05-28 NOTE — Progress Notes (Signed)
History of Present Illness  There is no documented history at this time  Assessments & Plan   There are no diagnoses linked to this encounter.    Additional instructions  Subjective:  Patient presents with venous ulcer of the Right lower extremity.    Procedure:  3 layer unna wrap was placed Right lower extremity.   Plan:   Follow up in one week.   

## 2018-06-04 ENCOUNTER — Encounter (INDEPENDENT_AMBULATORY_CARE_PROVIDER_SITE_OTHER): Payer: Self-pay | Admitting: Nurse Practitioner

## 2018-06-04 ENCOUNTER — Ambulatory Visit (INDEPENDENT_AMBULATORY_CARE_PROVIDER_SITE_OTHER): Payer: Medicare Other | Admitting: Nurse Practitioner

## 2018-06-04 VITALS — BP 115/72 | HR 108 | Resp 16

## 2018-06-04 DIAGNOSIS — I10 Essential (primary) hypertension: Secondary | ICD-10-CM | POA: Diagnosis not present

## 2018-06-04 DIAGNOSIS — I890 Lymphedema, not elsewhere classified: Secondary | ICD-10-CM

## 2018-06-04 DIAGNOSIS — L97919 Non-pressure chronic ulcer of unspecified part of right lower leg with unspecified severity: Secondary | ICD-10-CM | POA: Diagnosis not present

## 2018-06-04 DIAGNOSIS — I83019 Varicose veins of right lower extremity with ulcer of unspecified site: Secondary | ICD-10-CM

## 2018-06-10 ENCOUNTER — Encounter (INDEPENDENT_AMBULATORY_CARE_PROVIDER_SITE_OTHER): Payer: Self-pay | Admitting: Nurse Practitioner

## 2018-06-10 DIAGNOSIS — I83019 Varicose veins of right lower extremity with ulcer of unspecified site: Secondary | ICD-10-CM | POA: Insufficient documentation

## 2018-06-10 DIAGNOSIS — L97919 Non-pressure chronic ulcer of unspecified part of right lower leg with unspecified severity: Secondary | ICD-10-CM

## 2018-06-10 NOTE — Progress Notes (Signed)
Subjective:    Patient ID: Debra Butler, female    DOB: 09/19/1921, 85 y.o.   MRN: [id number] Chief Complaint  Patient presents with  . Follow-up    UNNA BOOT    HPI  Debra Butler is a 85 y.o. female presents today to evaluate her right lower extremity wound following Unna wrap therapy.  The wound originated after a fall at her home.  She had what look looked like a skin tear, she was placed in [place] wraps in order to control her edema as well as to try to heal her wound.  The patient states that she has been tolerating Unna boot therapy with no issues.  Denies any fever, chills, nausea, vomiting or diarrhea.   Past Medical History:  Diagnosis Date  . Arthritis   . Blood clot in vein 2013   left leg  . Diabetes mellitus without complication ([place]) 0109  . Hypertension 1988  . IBS (irritable bowel syndrome)   . Myocardial infarction [facility])     Past Surgical History:  Procedure Laterality Date  . ABDOMINAL HYSTERECTOMY  age 56   partial, still has ovaries and tubes  . APPENDECTOMY    . BACK SURGERY     screws and titanium  . BREAST BIOPSY     at age 16, benign  . CARPAL TUNNEL RELEASE Right 2010  . CARPAL TUNNEL RELEASE Left 07/25/2017   Procedure: CARPAL TUNNEL RELEASE;  Surgeon: Earnestine Leys, MD;  Location: [facility] ORS;  Service: Orthopedics;  Laterality: Left;  . CHOLECYSTECTOMY    . COLON SURGERY  1984  . COLONOSCOPY  2007, 2016   Dr Jamal Collin  . FOOT SURGERY    . FRACTURE SURGERY  2010   hip  . FRACTURE SURGERY  2009   femur  . JOINT REPLACEMENT     both knee  . PACEMAKER PLACEMENT  2013  . TONSILLECTOMY    . UPPER GI ENDOSCOPY  2016   Dr Jamal Collin    Social History   Socioeconomic History  . Marital status: Married    Spouse name: Not on file  . Number of children: Not on file  . Years of education: Not on file  . Highest education level: Not on file  Occupational History  . Not on file  Social Needs  . Financial resource strain: Not on file  .  Food insecurity:    Worry: Not on file    Inability: Not on file  . Transportation needs:    Medical: Not on file    Non-medical: Not on file  Tobacco Use  . Smoking status: Never Smoker  . Smokeless tobacco: Never Used  Substance and Sexual Activity  . Alcohol use: No  . Drug use: No  . Sexual activity: Not on file  Lifestyle  . Physical activity:    Days per week: Not on file    Minutes per session: Not on file  . Stress: Not on file  Relationships  . Social connections:    Talks on phone: Not on file    Gets together: Not on file    Attends religious service: Not on file    Active member of club or organization: Not on file    Attends meetings of clubs or organizations: Not on file    Relationship status: Not on file  . Intimate partner violence:    Fear of current or ex partner: Not on file    Emotionally abused: Not on file  Physically abused: Not on file    Forced sexual activity: Not on file  Other Topics Concern  . Not on file  Social History Narrative  . Not on file    History reviewed. No pertinent family history.  Allergies  Allergen Reactions  . Sulphur [Sulfur] Hives    And seizure  . Phenergan [Promethazine] Other (See Comments)    Altered mental status     Review of Systems   Review of Systems: Negative Unless Checked Constitutional: [] Weight loss  [] Fever  [] Chills Cardiac: [] Chest pain   []  Atrial Fibrillation  [] Palpitations   [] Shortness of breath when laying flat   [] Shortness of breath with exertion. [] Shortness of breath at rest Vascular:  [] Pain in legs with walking   [] Pain in legs with standing [] Pain in legs when laying flat   [] Claudication    [] Pain in feet when laying flat    [] History of DVT   [] Phlebitis   [x] Swelling in legs   [] Varicose veins   [x] Non-healing ulcers Pulmonary:   [] Uses home oxygen   [] Productive cough   [] Hemoptysis   [] Wheeze  [] COPD   [] Asthma Neurologic:  [] Dizziness   [] Seizures  [] Blackouts [] History of  stroke   [] History of TIA  [] Aphasia   [] Temporary Blindness   [] Weakness or numbness in arm   [x] Weakness or numbness in leg Musculoskeletal:   [] Joint swelling   [] Joint pain   [] Low back pain  []  History of Knee Replacement [] Arthritis [] back Surgeries  []  Spinal Stenosis    Hematologic:  [] Easy bruising  [] Easy bleeding   [] Hypercoagulable state   [] Anemic Gastrointestinal:  [] Diarrhea   [] Vomiting  [] Gastroesophageal reflux/heartburn   [] Difficulty swallowing. [] Abdominal pain Genitourinary:  [] Chronic kidney disease   [] Difficult urination  [] Anuric   [] Blood in urine [] Frequent urination  [] Burning with urination   [] Hematuria Skin:  [] Rashes   [] Ulcers [] Wounds Psychological:  [] History of anxiety   []  History of major depression  []  Memory Difficulties     Objective:   Physical Exam  BP 115/72 (BP Location: Right Arm, Patient Position: Sitting)   Pulse (!) 108   Resp 16   Gen: WD/WN, NAD Head: [place]/AT, No temporalis wasting.  Ear/Nose/Throat: Hearing grossly intact, nares w/o erythema or drainage Eyes: PER, EOMI, sclera nonicteric.  Neck: Supple, no masses.  No JVD.  Pulmonary:  Good air movement, no use of accessory muscles.  Cardiac: RRR Vascular:  Patient has half dollar sized wound on right lateral calf. Vessel Right Left  Radial Palpable Palpable  Dorsalis Pedis Palpable Palpable  Posterior Tibial Palpable Palpable   Gastrointestinal: soft, non-distended. No guarding/no peritoneal signs.  Musculoskeletal: M/S 5/5 throughout.  No deformity or atrophy.  Neurologic: Pain and light touch intact in extremities.  Symmetrical.  Speech is fluent. Motor exam as listed above. Psychiatric: Judgment intact, Mood & affect appropriate for pt's clinical situation. Dermatologic: No Venous rashes. No Ulcers Noted.  No changes consistent with cellulitis. Lymph : No Cervical lymphadenopathy, no lichenification or skin changes of chronic lymphedema.      Assessment & Plan:   1. Venous  ulcer of right leg (HCC) The patient's ulceration resulted from a skin tear that she suffered after a fall at her home.  Today it appears as if that scan has fully taken.  The wound is approximately the size of a half dollar.  We will continue to place her in [place] wraps of her right lower extremity.  We will also obtain ABIs in order  to ensure that she has adequate blood flow in order to heal her lower extremity wound.  2. Essential hypertension Continue antihypertensive medications as already ordered, these medications have been reviewed and there are no changes at this time.   3. Lymphedema No surgery or intervention at this point in time.    I have had a long discussion with the patient regarding venous insufficiency and why it  causes symptoms, specifically venous ulceration . I have discussed with the patient the chronic skin changes that accompany venous insufficiency and the long term sequela such as infection and recurring  ulceration.  Patient will be placed in [employer] which will be changed weekly drainage permitting.  In addition, behavioral modification including several periods of elevation of the lower extremities during the day will be continued. Achieving a position with the ankles at heart level was stressed to the patient  The patient is instructed to begin routine exercise, especially walking on a daily basis     Current Outpatient Medications on File Prior to Visit  Medication Sig Dispense Refill  . aspirin 81 MG tablet Take 81 mg by mouth daily.    . cephALEXin (KEFLEX) 500 MG capsule Take 1 capsule (500 mg total) by mouth 2 (two) times daily. 14 capsule 0  . cholecalciferol (VITAMIN D) 1000 units tablet Take 1,000 Units by mouth daily.    . cyanocobalamin (,VITAMIN B-12,) 1000 MCG/ML injection Inject 1,000 mcg into the muscle every 30 (thirty) days.    . furosemide (LASIX) 80 MG tablet Take 80 mg by mouth 2 (two) times daily. In the morning & 4pm in the afternoon.     Marland Kitchen glipiZIDE (GLUCOTROL XL) 5 MG 24 hr tablet Take 5 mg by mouth daily.    Marland Kitchen HYDROcodone-acetaminophen (NORCO) 5-325 MG tablet Take 1 tablet by mouth every 6 (six) hours as needed. 40 tablet 0  . levothyroxine (SYNTHROID, LEVOTHROID) 112 MCG tablet Take 112 mcg by mouth daily before breakfast.    . metFORMIN (GLUCOPHAGE) 1000 MG tablet Take 1,000 mg by mouth 2 (two) times daily. With Breakfast & with supper    . nitroGLYCERIN (NITROSTAT) 0.4 MG SL tablet Place 0.4 mg under the tongue every 5 (five) minutes as needed for chest pain.    . potassium chloride SA (K-DUR,KLOR-CON) 20 MEQ tablet Take 20 mEq by mouth 2 (two) times daily.    . Pramox-PE-Glycerin-Petrolatum (PREPARATION H) 1-0.25-14.4-15 % CREA Place 1 application rectally 3 (three) times daily as needed (for rectal discomfort/hemmorroids.).    Marland Kitchen pregabalin (LYRICA) 100 MG capsule Take 100 mg by mouth 2 (two) times daily.    Marland Kitchen warfarin (COUMADIN) 6 MG tablet Take 6 mg by mouth daily with supper.     No current facility-administered medications on file prior to visit.     There are no Patient Instructions on file for this visit. No follow-ups on file.   Kris Hartmann, NP  This note was completed with Sales executive.  Any errors are purely unintentional.

## 2018-06-11 ENCOUNTER — Ambulatory Visit (INDEPENDENT_AMBULATORY_CARE_PROVIDER_SITE_OTHER): Payer: Medicare Other | Admitting: Nurse Practitioner

## 2018-06-11 DIAGNOSIS — I83019 Varicose veins of right lower extremity with ulcer of unspecified site: Secondary | ICD-10-CM | POA: Diagnosis not present

## 2018-06-11 DIAGNOSIS — L97919 Non-pressure chronic ulcer of unspecified part of right lower leg with unspecified severity: Secondary | ICD-10-CM | POA: Diagnosis not present

## 2018-06-12 ENCOUNTER — Encounter (INDEPENDENT_AMBULATORY_CARE_PROVIDER_SITE_OTHER): Payer: Self-pay | Admitting: Nurse Practitioner

## 2018-06-12 NOTE — Progress Notes (Signed)
History of Present Illness  There is no documented history at this time  Assessments & Plan   There are no diagnoses linked to this encounter.    Additional instructions  Subjective:  Patient presents with venous ulcer of the Right lower extremity.    Procedure:  3 layer unna wrap was placed Right lower extremity.   Plan:   Follow up in one week.   

## 2018-06-18 ENCOUNTER — Ambulatory Visit (INDEPENDENT_AMBULATORY_CARE_PROVIDER_SITE_OTHER): Payer: Medicare Other | Admitting: Vascular Surgery

## 2018-06-18 VITALS — BP 144/87 | HR 87 | Resp 16 | Ht <= 58 in | Wt 180.0 lb

## 2018-06-18 DIAGNOSIS — I83019 Varicose veins of right lower extremity with ulcer of unspecified site: Secondary | ICD-10-CM | POA: Diagnosis not present

## 2018-06-18 DIAGNOSIS — L97919 Non-pressure chronic ulcer of unspecified part of right lower leg with unspecified severity: Secondary | ICD-10-CM | POA: Diagnosis not present

## 2018-06-18 NOTE — Progress Notes (Signed)
History of Present Illness  Venous ulcer of the right leg  Assessments & Plan   Venous ulcer of the right leg    Additional instructions  Subjective:  Patient presents with venous ulcer of the Right lower extremity.    Procedure:  3 layer unna wrap was placed Right lower extremity.   Plan:   Follow up in one week.

## 2018-06-24 ENCOUNTER — Other Ambulatory Visit (INDEPENDENT_AMBULATORY_CARE_PROVIDER_SITE_OTHER): Payer: Self-pay | Admitting: Nurse Practitioner

## 2018-06-24 DIAGNOSIS — I83019 Varicose veins of right lower extremity with ulcer of unspecified site: Secondary | ICD-10-CM

## 2018-06-24 DIAGNOSIS — L97919 Non-pressure chronic ulcer of unspecified part of right lower leg with unspecified severity: Principal | ICD-10-CM

## 2018-06-24 DIAGNOSIS — I10 Essential (primary) hypertension: Secondary | ICD-10-CM

## 2018-06-24 DIAGNOSIS — I890 Lymphedema, not elsewhere classified: Secondary | ICD-10-CM

## 2018-06-25 ENCOUNTER — Ambulatory Visit (INDEPENDENT_AMBULATORY_CARE_PROVIDER_SITE_OTHER): Payer: Medicare Other

## 2018-06-25 ENCOUNTER — Encounter (INDEPENDENT_AMBULATORY_CARE_PROVIDER_SITE_OTHER): Payer: Self-pay | Admitting: Nurse Practitioner

## 2018-06-25 ENCOUNTER — Ambulatory Visit (INDEPENDENT_AMBULATORY_CARE_PROVIDER_SITE_OTHER): Payer: Medicare Other | Admitting: Nurse Practitioner

## 2018-06-25 VITALS — BP 112/74 | HR 96 | Resp 16 | Ht <= 58 in

## 2018-06-25 DIAGNOSIS — I10 Essential (primary) hypertension: Secondary | ICD-10-CM | POA: Diagnosis not present

## 2018-06-25 DIAGNOSIS — I83018 Varicose veins of right lower extremity with ulcer other part of lower leg: Secondary | ICD-10-CM | POA: Diagnosis not present

## 2018-06-25 DIAGNOSIS — I890 Lymphedema, not elsewhere classified: Secondary | ICD-10-CM

## 2018-06-25 DIAGNOSIS — I83019 Varicose veins of right lower extremity with ulcer of unspecified site: Secondary | ICD-10-CM

## 2018-06-25 DIAGNOSIS — L97819 Non-pressure chronic ulcer of other part of right lower leg with unspecified severity: Secondary | ICD-10-CM | POA: Diagnosis not present

## 2018-06-25 DIAGNOSIS — L97919 Non-pressure chronic ulcer of unspecified part of right lower leg with unspecified severity: Principal | ICD-10-CM

## 2018-06-26 ENCOUNTER — Encounter (INDEPENDENT_AMBULATORY_CARE_PROVIDER_SITE_OTHER): Payer: Self-pay | Admitting: Nurse Practitioner

## 2018-06-26 NOTE — Progress Notes (Addendum)
Subjective:    Patient ID: Debra Butler, female    DOB: 11-Jul-1921, 85 y.o.   MRN: [id number] Chief Complaint  Patient presents with  . Follow-up    HPI  Debra Butler is a 85 y.o. female presents today for evaluation of her right lower extremity ulceration.  Today it appears markedly better.  The wound is approximately the size of a dime.  Her swelling is much better.  Denies any issues with Unna wrap therapy.  She does endorse that she would like to be out of Unna wraps by her birthday, which is possible based on the wound healing.  She denies any fever, chills, nausea, vomiting or diarrhea.  She denies any chest pain or shortness of breath.  She underwent bilateral ABIs today which were noncompressible.  However she had triphasic waveforms in the bilateral tibial arteries.  She had strong digit waveforms.  Her TBI on her right lower extremity was 0.77 and her TBI her left is 0.71.  Past Medical History:  Diagnosis Date  . Arthritis   . Blood clot in vein 2013   left leg  . Diabetes mellitus without complication ([place]) 8101  . Hypertension 1988  . IBS (irritable bowel syndrome)   . Myocardial infarction [facility])     Past Surgical History:  Procedure Laterality Date  . ABDOMINAL HYSTERECTOMY  age 87   partial, still has ovaries and tubes  . APPENDECTOMY    . BACK SURGERY     screws and titanium  . BREAST BIOPSY     at age 61, benign  . CARPAL TUNNEL RELEASE Right 2010  . CARPAL TUNNEL RELEASE Left 07/25/2017   Procedure: CARPAL TUNNEL RELEASE;  Surgeon: Earnestine Leys, MD;  Location: [facility] ORS;  Service: Orthopedics;  Laterality: Left;  . CHOLECYSTECTOMY    . COLON SURGERY  1984  . COLONOSCOPY  2007, 2016   Dr Jamal Collin  . FOOT SURGERY    . FRACTURE SURGERY  2010   hip  . FRACTURE SURGERY  2009   femur  . JOINT REPLACEMENT     both knee  . PACEMAKER PLACEMENT  2013  . TONSILLECTOMY    . UPPER GI ENDOSCOPY  2016   Dr Jamal Collin    Social History   Socioeconomic  History  . Marital status: Married    Spouse name: Not on file  . Number of children: Not on file  . Years of education: Not on file  . Highest education level: Not on file  Occupational History  . Not on file  Social Needs  . Financial resource strain: Not on file  . Food insecurity:    Worry: Not on file    Inability: Not on file  . Transportation needs:    Medical: Not on file    Non-medical: Not on file  Tobacco Use  . Smoking status: Never Smoker  . Smokeless tobacco: Never Used  Substance and Sexual Activity  . Alcohol use: No  . Drug use: No  . Sexual activity: Not on file  Lifestyle  . Physical activity:    Days per week: Not on file    Minutes per session: Not on file  . Stress: Not on file  Relationships  . Social connections:    Talks on phone: Not on file    Gets together: Not on file    Attends religious service: Not on file    Active member of club or organization: Not on file  Attends meetings of clubs or organizations: Not on file    Relationship status: Not on file  . Intimate partner violence:    Fear of current or ex partner: Not on file    Emotionally abused: Not on file    Physically abused: Not on file    Forced sexual activity: Not on file  Other Topics Concern  . Not on file  Social History Narrative  . Not on file    History reviewed. No pertinent family history.  Allergies  Allergen Reactions  . Sulphur [Sulfur] Hives    And seizure  . Phenergan [Promethazine] Other (See Comments)    Altered mental status     Review of Systems   Review of Systems: Negative Unless Checked Constitutional: [] Weight loss  [] Fever  [] Chills Cardiac: [] Chest pain   []  Atrial Fibrillation  [] Palpitations   [] Shortness of breath when laying flat   [] Shortness of breath with exertion. [] Shortness of breath at rest Vascular:  [] Pain in legs with walking   [] Pain in legs with standing [] Pain in legs when laying flat   [] Claudication    [] Pain in feet  when laying flat    [] History of DVT   [] Phlebitis   [x] Swelling in legs   [x] Varicose veins   [] Non-healing ulcers Pulmonary:   [] Uses home oxygen   [] Productive cough   [] Hemoptysis   [] Wheeze  [] COPD   [] Asthma Neurologic:  [] Dizziness   [] Seizures  [] Blackouts [] History of stroke   [] History of TIA  [] Aphasia   [] Temporary Blindness   [] Weakness or numbness in arm   [] Weakness or numbness in leg Musculoskeletal:   [] Joint swelling   [] Joint pain   [] Low back pain  []  History of Knee Replacement [] Arthritis [] back Surgeries  []  Spinal Stenosis    Hematologic:  [] Easy bruising  [] Easy bleeding   [] Hypercoagulable state   [] Anemic Gastrointestinal:  [] Diarrhea   [] Vomiting  [] Gastroesophageal reflux/heartburn   [] Difficulty swallowing. [] Abdominal pain Genitourinary:  [] Chronic kidney disease   [] Difficult urination  [] Anuric   [] Blood in urine [] Frequent urination  [] Burning with urination   [] Hematuria Skin:  [] Rashes   [x] Ulcers [] Wounds Psychological:  [] History of anxiety   []  History of major depression  []  Memory Difficulties     Objective:   Physical Exam  BP 112/74 (BP Location: Left Arm, Patient Position: Sitting, Cuff Size: Large)   Pulse 96   Resp 16   Ht 4\' 1"  (1.245 m)   BMI 52.71 kg/m   Gen: WD/WN, NAD Head: [place]/AT, No temporalis wasting.  Ear/Nose/Throat: Hearing grossly intact, nares w/o erythema or drainage Eyes: PER, EOMI, sclera nonicteric.  Neck: Supple, no masses.  No JVD.  Pulmonary:  Good air movement, no use of accessory muscles.  Cardiac: RRR Vascular:  Vessel Right Left  Radial Palpable Palpable  Dorsalis Pedis Palpable Palpable  Posterior Tibial Palpable Palpable   Gastrointestinal: soft, non-distended. No guarding/no peritoneal signs.  Musculoskeletal: Uses wheelchair for mobility. no deformity or atrophy.  Neurologic: Pain and light touch intact in extremities.  Symmetrical.  Speech is fluent. Motor exam as listed above. Psychiatric: Judgment intact,  Mood & affect appropriate for pt's clinical situation. Dermatologic:  Dime sized ulcer on right lower extremity, on shin. No changes consistent with cellulitis. Lymph : No Cervical lymphadenopathy, no lichenification or skin changes of chronic lymphedema.      Assessment & Plan:   1. Venous ulcer of right leg (HCC) Noninvasive studies reveal that there is not an arterial bone  into her wound.  She should have more than enough adequate blood flow to heal her wound.  Ulceration appears much better with Unna wrap therapy.  Is not quite healed but close.  We will continue to have her in [place] wraps and we will have her follow-up in 3 weeks for evaluation.  Also stressed to the patient that she should begin wearing her medical grade 1 compression stockings as soon as she is out of Unna wraps to prevent further swelling and possible re-ulceration.  Patient is in agreement with the plan she will return to the office weekly to have her own wraps placed.  2. Essential hypertension Continue antihypertensive medications as already ordered, these medications have been reviewed and there are no changes at this time.   3. Lymphedema Swelling is much more under control bilaterally.  The patient continues to wear her medical grade 1 compression stocking on her left lower extremity.  She continues to elevate her legs and exercise as much as possible.   Current Outpatient Medications on File Prior to Visit  Medication Sig Dispense Refill  . aspirin 81 MG tablet Take 81 mg by mouth daily.    . cholecalciferol (VITAMIN D) 1000 units tablet Take 1,000 Units by mouth daily.    . cyanocobalamin (,VITAMIN B-12,) 1000 MCG/ML injection Inject 1,000 mcg into the muscle every 30 (thirty) days.    . furosemide (LASIX) 80 MG tablet Take 80 mg by mouth 2 (two) times daily. In the morning & 4pm in the afternoon.    Marland Kitchen glipiZIDE (GLUCOTROL XL) 5 MG 24 hr tablet Take 5 mg by mouth daily.    Marland Kitchen HYDROcodone-acetaminophen  (NORCO) 5-325 MG tablet Take 1 tablet by mouth every 6 (six) hours as needed. 40 tablet 0  . levothyroxine (SYNTHROID, LEVOTHROID) 112 MCG tablet Take 112 mcg by mouth daily before breakfast.    . metFORMIN (GLUCOPHAGE) 1000 MG tablet Take 1,000 mg by mouth 2 (two) times daily. With Breakfast & with supper    . nitroGLYCERIN (NITROSTAT) 0.4 MG SL tablet Place 0.4 mg under the tongue every 5 (five) minutes as needed for chest pain.    . potassium chloride SA (K-DUR,KLOR-CON) 20 MEQ tablet Take 20 mEq by mouth 2 (two) times daily.    . Pramox-PE-Glycerin-Petrolatum (PREPARATION H) 1-0.25-14.4-15 % CREA Place 1 application rectally 3 (three) times daily as needed (for rectal discomfort/hemmorroids.).    Marland Kitchen pregabalin (LYRICA) 100 MG capsule Take 100 mg by mouth 2 (two) times daily.    Marland Kitchen warfarin (COUMADIN) 6 MG tablet Take 6 mg by mouth daily with supper.     No current facility-administered medications on file prior to visit.     There are no Patient Instructions on file for this visit. No follow-ups on file.   Kris Hartmann, NP  This note was completed with Sales executive.  Any errors are purely unintentional.

## 2018-07-01 ENCOUNTER — Telehealth (INDEPENDENT_AMBULATORY_CARE_PROVIDER_SITE_OTHER): Payer: Self-pay | Admitting: Nurse Practitioner

## 2018-07-02 ENCOUNTER — Encounter (INDEPENDENT_AMBULATORY_CARE_PROVIDER_SITE_OTHER): Payer: Self-pay

## 2018-07-02 ENCOUNTER — Encounter (INDEPENDENT_AMBULATORY_CARE_PROVIDER_SITE_OTHER): Payer: Medicare Other

## 2018-07-04 ENCOUNTER — Ambulatory Visit (INDEPENDENT_AMBULATORY_CARE_PROVIDER_SITE_OTHER): Payer: Medicare Other | Admitting: Nurse Practitioner

## 2018-07-04 ENCOUNTER — Encounter (INDEPENDENT_AMBULATORY_CARE_PROVIDER_SITE_OTHER): Payer: Self-pay | Admitting: Nurse Practitioner

## 2018-07-04 VITALS — BP 112/74 | HR 94 | Resp 16

## 2018-07-04 DIAGNOSIS — L03119 Cellulitis of unspecified part of limb: Secondary | ICD-10-CM

## 2018-07-04 DIAGNOSIS — L97929 Non-pressure chronic ulcer of unspecified part of left lower leg with unspecified severity: Secondary | ICD-10-CM | POA: Diagnosis not present

## 2018-07-04 DIAGNOSIS — I83029 Varicose veins of left lower extremity with ulcer of unspecified site: Secondary | ICD-10-CM

## 2018-07-04 DIAGNOSIS — I10 Essential (primary) hypertension: Secondary | ICD-10-CM

## 2018-07-04 DIAGNOSIS — I83019 Varicose veins of right lower extremity with ulcer of unspecified site: Secondary | ICD-10-CM | POA: Diagnosis not present

## 2018-07-04 DIAGNOSIS — L97919 Non-pressure chronic ulcer of unspecified part of right lower leg with unspecified severity: Secondary | ICD-10-CM

## 2018-07-04 MED ORDER — CEPHALEXIN 500 MG PO CAPS: 500.0000 mg | ORAL_CAPSULE | Freq: Two times a day (BID) | ORAL | 0 refills | Status: DC

## 2018-07-09 ENCOUNTER — Encounter (INDEPENDENT_AMBULATORY_CARE_PROVIDER_SITE_OTHER): Payer: Self-pay | Admitting: Nurse Practitioner

## 2018-07-09 ENCOUNTER — Encounter (INDEPENDENT_AMBULATORY_CARE_PROVIDER_SITE_OTHER): Payer: Medicare Other

## 2018-07-09 DIAGNOSIS — I83029 Varicose veins of left lower extremity with ulcer of unspecified site: Secondary | ICD-10-CM | POA: Insufficient documentation

## 2018-07-09 DIAGNOSIS — L97929 Non-pressure chronic ulcer of unspecified part of left lower leg with unspecified severity: Secondary | ICD-10-CM

## 2018-07-09 NOTE — Progress Notes (Signed)
Subjective:    Patient ID: Debra Butler, female    DOB: 1922-01-24, 85 y.o.   MRN: [id number] Chief Complaint  Patient presents with  . Follow-up    UNNA BOOT FOLLOW UP    HPI  Debra Butler is a 85 y.o. female that presents today with concerns for a left lower extremity ulceration.  The patient was previously in [place] wraps due to a wound on the right lower extremity.  The wound had healed to the point where there was less than a dime sized amount of ulceration left.  The patient requested to transition to compression wraps with topical wound care.  Due to the size of the wound this was reasonable at the time.  However, a few days later she developed a blister on her left lower extremity.  The blister was approximately the size of a $0.50 piece with the fullness of a golf ball.  She is unaware of what caused it to develop.  The patient denies any fever, chills, nausea, vomiting or diarrhea.  She denies any chest pain or shortness of breath.  She denies any TIA-like symptoms or amaurosis fugax.  Past Medical History:  Diagnosis Date  . Arthritis   . Blood clot in vein 2013   left leg  . Diabetes mellitus without complication ([place]) 6767  . Hypertension 1988  . IBS (irritable bowel syndrome)   . Myocardial infarction [facility])     Past Surgical History:  Procedure Laterality Date  . ABDOMINAL HYSTERECTOMY  age 82   partial, still has ovaries and tubes  . APPENDECTOMY    . BACK SURGERY     screws and titanium  . BREAST BIOPSY     at age 59, benign  . CARPAL TUNNEL RELEASE Right 2010  . CARPAL TUNNEL RELEASE Left 07/25/2017   Procedure: CARPAL TUNNEL RELEASE;  Surgeon: Earnestine Leys, MD;  Location: [facility] ORS;  Service: Orthopedics;  Laterality: Left;  . CHOLECYSTECTOMY    . COLON SURGERY  1984  . COLONOSCOPY  2007, 2016   Dr Jamal Collin  . FOOT SURGERY    . FRACTURE SURGERY  2010   hip  . FRACTURE SURGERY  2009   femur  . JOINT REPLACEMENT     both knee  . PACEMAKER PLACEMENT   2013  . TONSILLECTOMY    . UPPER GI ENDOSCOPY  2016   Dr Jamal Collin    Social History   Socioeconomic History  . Marital status: Married    Spouse name: Not on file  . Number of children: Not on file  . Years of education: Not on file  . Highest education level: Not on file  Occupational History  . Not on file  Social Needs  . Financial resource strain: Not on file  . Food insecurity:    Worry: Not on file    Inability: Not on file  . Transportation needs:    Medical: Not on file    Non-medical: Not on file  Tobacco Use  . Smoking status: Never Smoker  . Smokeless tobacco: Never Used  Substance and Sexual Activity  . Alcohol use: No  . Drug use: No  . Sexual activity: Not on file  Lifestyle  . Physical activity:    Days per week: Not on file    Minutes per session: Not on file  . Stress: Not on file  Relationships  . Social connections:    Talks on phone: Not on file    Gets together:  Not on file    Attends religious service: Not on file    Active member of club or organization: Not on file    Attends meetings of clubs or organizations: Not on file    Relationship status: Not on file  . Intimate partner violence:    Fear of current or ex partner: Not on file    Emotionally abused: Not on file    Physically abused: Not on file    Forced sexual activity: Not on file  Other Topics Concern  . Not on file  Social History Narrative  . Not on file    History reviewed. No pertinent family history.  Allergies  Allergen Reactions  . Sulphur [Sulfur] Hives    And seizure  . Phenergan [Promethazine] Other (See Comments)    Altered mental status     Review of Systems   Review of Systems: Negative Unless Checked Constitutional: [] Weight loss  [] Fever  [] Chills Cardiac: [] Chest pain   []  Atrial Fibrillation  [] Palpitations   [] Shortness of breath when laying flat   [] Shortness of breath with exertion. [] Shortness of breath at rest Vascular:  [] Pain in legs with  walking   [] Pain in legs with standing [] Pain in legs when laying flat   [] Claudication    [] Pain in feet when laying flat    [] History of DVT   [] Phlebitis   [x] Swelling in legs   [] Varicose veins   [] Non-healing ulcers Pulmonary:   [] Uses home oxygen   [] Productive cough   [] Hemoptysis   [] Wheeze  [] COPD   [] Asthma Neurologic:  [] Dizziness   [] Seizures  [] Blackouts [] History of stroke   [] History of TIA  [] Aphasia   [] Temporary Blindness   [] Weakness or numbness in arm   [] Weakness or numbness in leg Musculoskeletal:   [] Joint swelling   [] Joint pain   [] Low back pain  []  History of Knee Replacement [] Arthritis [] back Surgeries  []  Spinal Stenosis    Hematologic:  [x] Easy bruising  [] Easy bleeding   [] Hypercoagulable state   [] Anemic Gastrointestinal:  [] Diarrhea   [] Vomiting  [] Gastroesophageal reflux/heartburn   [] Difficulty swallowing. [] Abdominal pain Genitourinary:  [] Chronic kidney disease   [] Difficult urination  [] Anuric   [] Blood in urine [] Frequent urination  [] Burning with urination   [] Hematuria Skin:  [] Rashes   [x] Ulcers [] Wounds Psychological:  [x] History of anxiety   []  History of major depression  []  Memory Difficulties     Objective:   Physical Exam  BP 112/74 (BP Location: Right Arm, Patient Position: Sitting)   Pulse 94   Resp 16   Gen: WD/WN, NAD Head: [place]/AT, No temporalis wasting.  Ear/Nose/Throat: Hearing grossly intact, nares w/o erythema or drainage Eyes: PER, EOMI, sclera nonicteric.  Neck: Supple, no masses.  No JVD.  Pulmonary:  Good air movement, no use of accessory muscles.  Cardiac: RRR Vascular:  Small dime sized ulceration on right lower extremity, large blister on left lower extremity near ankle Vessel Right Left  Radial Palpable Palpable  Dorsalis Pedis Palpable Palpable  Posterior Tibial Palpable Palpable   Gastrointestinal: soft, non-distended. No guarding/no peritoneal signs.  Musculoskeletal: M/S 5/5 throughout.  No deformity or atrophy.    Neurologic: Pain and light touch intact in extremities.  Symmetrical.  Speech is fluent. Motor exam as listed above. Psychiatric: Judgment intact, Mood & affect appropriate for pt's clinical situation. Dermatologic:   No changes consistent with cellulitis. Lymph : No Cervical lymphadenopathy, no lichenification or skin changes of chronic lymphedema.      Assessment &  Plan:   1. Cellulitis of lower extremity, unspecified laterality Oral antibiotics were given as a prophylactic measure, due to the fact that the blister was ruptured today in office.  This was done using aseptic technique however due to the patient's age, an abundance of caution was taken and she was given a short course of antibiotics to prevent infection due to opening of this blister.  2. Venous ulcer of right leg (HCC) We will place the patient in bilateral Unna wraps today in order to attempt to gain control of her lower extremity swelling, as well as the ulcerations.  She will present in office weekly to have her dressings changed.  She should wear them continuously for a week not to get them wet.  In 4 weeks she will present to the office to have her wound evaluated to determine whether she can transition into medical grade 1 compression therapy again.  3. Venous ulcer of left leg ([place]) See above  4. Essential hypertension Continue antihypertensive medications as already ordered, these medications have been reviewed and there are no changes at this time.    Current Outpatient Medications on File Prior to Visit  Medication Sig Dispense Refill  . aspirin 81 MG tablet Take 81 mg by mouth daily.    . cholecalciferol (VITAMIN D) 1000 units tablet Take 1,000 Units by mouth daily.    . cyanocobalamin (,VITAMIN B-12,) 1000 MCG/ML injection Inject 1,000 mcg into the muscle every 30 (thirty) days.    . furosemide (LASIX) 80 MG tablet Take 80 mg by mouth 2 (two) times daily. In the morning & 4pm in the afternoon.    Marland Kitchen  glipiZIDE (GLUCOTROL XL) 5 MG 24 hr tablet Take 5 mg by mouth daily.    Marland Kitchen HYDROcodone-acetaminophen (NORCO) 5-325 MG tablet Take 1 tablet by mouth every 6 (six) hours as needed. 40 tablet 0  . levothyroxine (SYNTHROID, LEVOTHROID) 112 MCG tablet Take 112 mcg by mouth daily before breakfast.    . metFORMIN (GLUCOPHAGE) 1000 MG tablet Take 1,000 mg by mouth 2 (two) times daily. With Breakfast & with supper    . nitroGLYCERIN (NITROSTAT) 0.4 MG SL tablet Place 0.4 mg under the tongue every 5 (five) minutes as needed for chest pain.    . potassium chloride SA (K-DUR,KLOR-CON) 20 MEQ tablet Take 20 mEq by mouth 2 (two) times daily.    . Pramox-PE-Glycerin-Petrolatum (PREPARATION H) 1-0.25-14.4-15 % CREA Place 1 application rectally 3 (three) times daily as needed (for rectal discomfort/hemmorroids.).    Marland Kitchen pregabalin (LYRICA) 100 MG capsule Take 100 mg by mouth 2 (two) times daily.    Marland Kitchen warfarin (COUMADIN) 6 MG tablet Take 6 mg by mouth daily with supper.     No current facility-administered medications on file prior to visit.     There are no Patient Instructions on file for this visit. No follow-ups on file.   Kris Hartmann, NP  This note was completed with Sales executive.  Any errors are purely unintentional.

## 2018-07-11 ENCOUNTER — Ambulatory Visit (INDEPENDENT_AMBULATORY_CARE_PROVIDER_SITE_OTHER): Payer: Medicare Other | Admitting: Nurse Practitioner

## 2018-07-11 DIAGNOSIS — L97919 Non-pressure chronic ulcer of unspecified part of right lower leg with unspecified severity: Secondary | ICD-10-CM | POA: Diagnosis not present

## 2018-07-11 DIAGNOSIS — L97929 Non-pressure chronic ulcer of unspecified part of left lower leg with unspecified severity: Secondary | ICD-10-CM | POA: Diagnosis not present

## 2018-07-11 DIAGNOSIS — I83019 Varicose veins of right lower extremity with ulcer of unspecified site: Secondary | ICD-10-CM

## 2018-07-11 DIAGNOSIS — I83029 Varicose veins of left lower extremity with ulcer of unspecified site: Secondary | ICD-10-CM

## 2018-07-11 NOTE — Progress Notes (Signed)
History of Present Illness  There is no documented history at this time  Assessments & Plan   There are no diagnoses linked to this encounter.    Additional instructions  Subjective:  Patient presents with venous ulcer of the Bilateral lower extremity.    Procedure:  3 layer unna wrap was placed Bilateral lower extremity.   Plan:   Follow up in one week.  

## 2018-07-16 ENCOUNTER — Ambulatory Visit (INDEPENDENT_AMBULATORY_CARE_PROVIDER_SITE_OTHER): Payer: Medicare Other | Admitting: Nurse Practitioner

## 2018-07-18 ENCOUNTER — Ambulatory Visit (INDEPENDENT_AMBULATORY_CARE_PROVIDER_SITE_OTHER): Payer: Medicare Other | Admitting: Nurse Practitioner

## 2018-07-18 DIAGNOSIS — I83019 Varicose veins of right lower extremity with ulcer of unspecified site: Secondary | ICD-10-CM

## 2018-07-18 DIAGNOSIS — I83029 Varicose veins of left lower extremity with ulcer of unspecified site: Secondary | ICD-10-CM

## 2018-07-18 DIAGNOSIS — L97919 Non-pressure chronic ulcer of unspecified part of right lower leg with unspecified severity: Principal | ICD-10-CM

## 2018-07-18 DIAGNOSIS — L97929 Non-pressure chronic ulcer of unspecified part of left lower leg with unspecified severity: Secondary | ICD-10-CM

## 2018-07-18 NOTE — Progress Notes (Signed)
History of Present Illness  There is no documented history at this time  Assessments & Plan   There are no diagnoses linked to this encounter.    Additional instructions  Subjective:  Patient presents with venous ulcer of the Bilateral lower extremity.    Procedure:  3 layer unna wrap was placed Bilateral lower extremity.   Plan:   Follow up in one week.  

## 2018-07-22 ENCOUNTER — Encounter (INDEPENDENT_AMBULATORY_CARE_PROVIDER_SITE_OTHER): Payer: Self-pay | Admitting: Nurse Practitioner

## 2018-07-25 ENCOUNTER — Ambulatory Visit (INDEPENDENT_AMBULATORY_CARE_PROVIDER_SITE_OTHER): Payer: Medicare Other | Admitting: Nurse Practitioner

## 2018-07-25 ENCOUNTER — Encounter (INDEPENDENT_AMBULATORY_CARE_PROVIDER_SITE_OTHER): Payer: Self-pay | Admitting: Nurse Practitioner

## 2018-07-25 VITALS — BP 90/65 | HR 75 | Resp 16 | Ht 61.0 in

## 2018-07-25 DIAGNOSIS — I10 Essential (primary) hypertension: Secondary | ICD-10-CM | POA: Diagnosis not present

## 2018-07-25 DIAGNOSIS — I890 Lymphedema, not elsewhere classified: Secondary | ICD-10-CM

## 2018-07-25 DIAGNOSIS — Z79899 Other long term (current) drug therapy: Secondary | ICD-10-CM

## 2018-07-25 DIAGNOSIS — I83019 Varicose veins of right lower extremity with ulcer of unspecified site: Secondary | ICD-10-CM

## 2018-07-25 DIAGNOSIS — L97919 Non-pressure chronic ulcer of unspecified part of right lower leg with unspecified severity: Secondary | ICD-10-CM

## 2018-07-25 DIAGNOSIS — I83029 Varicose veins of left lower extremity with ulcer of unspecified site: Secondary | ICD-10-CM | POA: Diagnosis not present

## 2018-07-25 DIAGNOSIS — L97929 Non-pressure chronic ulcer of unspecified part of left lower leg with unspecified severity: Secondary | ICD-10-CM

## 2018-08-08 ENCOUNTER — Encounter (INDEPENDENT_AMBULATORY_CARE_PROVIDER_SITE_OTHER): Payer: Self-pay | Admitting: Nurse Practitioner

## 2018-08-08 NOTE — Progress Notes (Signed)
SUBJECTIVE:  Patient ID: Debra Butler, female    DOB: 11/04/1921, 85 y.o.   MRN: [id number] Chief Complaint  Patient presents with  . Follow-up    unna check    HPI  Debra Butler is a 85 y.o. female presents today for evaluation of lower extremity following Unna wraps.  Previously she had a small ulceration on her right lower extremity.  It was approximately the size of a dime.  She also had a blister that was ruptured on her left lower extremity.  Both of these previous wounds are resolved today.  Patient has no evidence of cellulitis.  Minimal edema.  She denies any fever, chills, nausea, vomiting or diarrhea.  Denies any chest pain or shortness of breath.  Denies any TIA-like symptoms.  She is present today with her caretaker.  Past Medical History:  Diagnosis Date  . Arthritis   . Blood clot in vein 2013   left leg  . Diabetes mellitus without complication ([place]) 9794  . Hypertension 1988  . IBS (irritable bowel syndrome)   . Myocardial infarction [facility])     Past Surgical History:  Procedure Laterality Date  . ABDOMINAL HYSTERECTOMY  age 86   partial, still has ovaries and tubes  . APPENDECTOMY    . BACK SURGERY     screws and titanium  . BREAST BIOPSY     at age 72, benign  . CARPAL TUNNEL RELEASE Right 2010  . CARPAL TUNNEL RELEASE Left 07/25/2017   Procedure: CARPAL TUNNEL RELEASE;  Surgeon: Earnestine Leys, MD;  Location: [facility] ORS;  Service: Orthopedics;  Laterality: Left;  . CHOLECYSTECTOMY    . COLON SURGERY  1984  . COLONOSCOPY  2007, 2016   Dr Jamal Collin  . FOOT SURGERY    . FRACTURE SURGERY  2010   hip  . FRACTURE SURGERY  2009   femur  . JOINT REPLACEMENT     both knee  . PACEMAKER PLACEMENT  2013  . TONSILLECTOMY    . UPPER GI ENDOSCOPY  2016   Dr Jamal Collin    Social History   Socioeconomic History  . Marital status: Married    Spouse name: Not on file  . Number of children: Not on file  . Years of education: Not on file  . Highest education  level: Not on file  Occupational History  . Not on file  Social Needs  . Financial resource strain: Not on file  . Food insecurity:    Worry: Not on file    Inability: Not on file  . Transportation needs:    Medical: Not on file    Non-medical: Not on file  Tobacco Use  . Smoking status: Never Smoker  . Smokeless tobacco: Never Used  Substance and Sexual Activity  . Alcohol use: No  . Drug use: No  . Sexual activity: Not on file  Lifestyle  . Physical activity:    Days per week: Not on file    Minutes per session: Not on file  . Stress: Not on file  Relationships  . Social connections:    Talks on phone: Not on file    Gets together: Not on file    Attends religious service: Not on file    Active member of club or organization: Not on file    Attends meetings of clubs or organizations: Not on file    Relationship status: Not on file  . Intimate partner violence:    Fear of current or  ex partner: Not on file    Emotionally abused: Not on file    Physically abused: Not on file    Forced sexual activity: Not on file  Other Topics Concern  . Not on file  Social History Narrative  . Not on file    History reviewed. No pertinent family history.  Allergies  Allergen Reactions  . Sulphur [Sulfur] Hives    And seizure  . Phenergan [Promethazine] Other (See Comments)    Altered mental status     Review of Systems   Review of Systems: Negative Unless Checked Constitutional: [] Weight loss  [] Fever  [] Chills Cardiac: [] Chest pain   []  Atrial Fibrillation  [] Palpitations   [] Shortness of breath when laying flat   [] Shortness of breath with exertion. [] Shortness of breath at rest Vascular:  [] Pain in legs with walking   [] Pain in legs with standing [] Pain in legs when laying flat   [] Claudication    [] Pain in feet when laying flat    [x] History of DVT   [] Phlebitis   [x] Swelling in legs   [x] Varicose veins   [] Non-healing ulcers Pulmonary:   [] Uses home oxygen    [] Productive cough   [] Hemoptysis   [] Wheeze  [] COPD   [] Asthma Neurologic:  [] Dizziness   [] Seizures  [] Blackouts [] History of stroke   [] History of TIA  [] Aphasia   [] Temporary Blindness   [] Weakness or numbness in arm   [x] Weakness or numbness in leg Musculoskeletal:   [] Joint swelling   [] Joint pain   [] Low back pain  []  History of Knee Replacement [] Arthritis [] back Surgeries  []  Spinal Stenosis    Hematologic:  [] Easy bruising  [] Easy bleeding   [] Hypercoagulable state   [] Anemic Gastrointestinal:  [] Diarrhea   [] Vomiting  [] Gastroesophageal reflux/heartburn   [] Difficulty swallowing. [] Abdominal pain Genitourinary:  [] Chronic kidney disease   [] Difficult urination  [] Anuric   [] Blood in urine [] Frequent urination  [] Burning with urination   [] Hematuria Skin:  [] Rashes   [] Ulcers [] Wounds Psychological:  [] History of anxiety   []  History of major depression  []  Memory Difficulties      OBJECTIVE:   Physical Exam  BP 90/65 (BP Location: Right Arm)   Pulse 75   Resp 16   Ht 5\' 1"  (1.549 m)   BMI 34.01 kg/m   Gen: WD/WN, NAD Head: [place]/AT, No temporalis wasting.  Ear/Nose/Throat: Hearing grossly intact, nares w/o erythema or drainage Eyes: PER, EOMI, sclera nonicteric.  Neck: Supple, no masses.  No JVD.  Pulmonary:  Good air movement, no use of accessory muscles.  Cardiac: RRR Vascular:  Minimal edema Vessel Right Left  Radial Palpable Palpable  Dorsalis Pedis Palpable Palpable  Posterior Tibial Palpable Palpable   Gastrointestinal: soft, non-distended. No guarding/no peritoneal signs.  Musculoskeletal: M/S 5/5 throughout.  No deformity or atrophy.  Neurologic: Pain and light touch intact in extremities.  Symmetrical.  Speech is fluent. Motor exam as listed above. Psychiatric: Judgment intact, Mood & affect appropriate for pt's clinical situation. Dermatologic: No Venous rashes. No Ulcers Noted.  No changes consistent with cellulitis. Lymph : No Cervical lymphadenopathy, no  lichenification or skin changes of chronic lymphedema.       ASSESSMENT AND PLAN:  1. Venous ulcer of left leg (HCC) Ulceration currently resolved.  The patient will go without any wraps.  The patient will transition back to medical grade 1 compression stockings.  She will also continue with conservative therapy as outlined below.  2. Venous ulcer of right leg ([place]) See above  3. Lymphedema Recommend:  No surgery or intervention at this point in time.    I have reviewed my previous discussion with the patient regarding swelling and why it causes symptoms.  Patient will continue wearing graduated compression stockings class 1 (20-30 mmHg) on a daily basis. The patient will  beginning wearing the stockings first thing in the morning and removing them in the evening. The patient is instructed specifically not to sleep in the stockings.    In addition, behavioral modification including several periods of elevation of the lower extremities during the day will be continued.  This was reviewed with the patient during the initial visit.  The patient will also continue routine exercise, especially walking on a daily basis as was discussed during the initial visit.    Patient should follow-up in six months     4. Essential hypertension Continue antihypertensive medications as already ordered, these medications have been reviewed and there are no changes at this time.    Current Outpatient Medications on File Prior to Visit  Medication Sig Dispense Refill  . aspirin 81 MG tablet Take 81 mg by mouth daily.    . cephALEXin (KEFLEX) 500 MG capsule Take 1 capsule (500 mg total) by mouth 2 (two) times daily. 10 capsule 0  . cholecalciferol (VITAMIN D) 1000 units tablet Take 1,000 Units by mouth daily.    . cyanocobalamin (,VITAMIN B-12,) 1000 MCG/ML injection Inject 1,000 mcg into the muscle every 30 (thirty) days.    . furosemide (LASIX) 80 MG tablet Take 80 mg by mouth 2 (two) times daily. In  the morning & 4pm in the afternoon.    Marland Kitchen glipiZIDE (GLUCOTROL XL) 5 MG 24 hr tablet Take 5 mg by mouth daily.    Marland Kitchen HYDROcodone-acetaminophen (NORCO) 5-325 MG tablet Take 1 tablet by mouth every 6 (six) hours as needed. 40 tablet 0  . levothyroxine (SYNTHROID, LEVOTHROID) 112 MCG tablet Take 112 mcg by mouth daily before breakfast.    . metFORMIN (GLUCOPHAGE) 1000 MG tablet Take 1,000 mg by mouth 2 (two) times daily. With Breakfast & with supper    . nitroGLYCERIN (NITROSTAT) 0.4 MG SL tablet Place 0.4 mg under the tongue every 5 (five) minutes as needed for chest pain.    . potassium chloride SA (K-DUR,KLOR-CON) 20 MEQ tablet Take 20 mEq by mouth 2 (two) times daily.    . Pramox-PE-Glycerin-Petrolatum (PREPARATION H) 1-0.25-14.4-15 % CREA Place 1 application rectally 3 (three) times daily as needed (for rectal discomfort/hemmorroids.).    Marland Kitchen pregabalin (LYRICA) 100 MG capsule Take 100 mg by mouth 2 (two) times daily.    Marland Kitchen warfarin (COUMADIN) 6 MG tablet Take 6 mg by mouth daily with supper.     No current facility-administered medications on file prior to visit.     There are no Patient Instructions on file for this visit. No follow-ups on file.   Kris Hartmann, NP  This note was completed with Sales executive.  Any errors are purely unintentional.

## 2018-08-13 ENCOUNTER — Telehealth (INDEPENDENT_AMBULATORY_CARE_PROVIDER_SITE_OTHER): Payer: Self-pay

## 2018-08-14 NOTE — Telephone Encounter (Signed)
Patient will be coming in the office 08/16/18 to see Arna Medici (N.P)

## 2018-08-14 NOTE — Telephone Encounter (Signed)
We can see her friday

## 2018-08-16 ENCOUNTER — Ambulatory Visit (INDEPENDENT_AMBULATORY_CARE_PROVIDER_SITE_OTHER): Payer: Medicare Other | Admitting: Nurse Practitioner

## 2018-08-16 ENCOUNTER — Encounter (INDEPENDENT_AMBULATORY_CARE_PROVIDER_SITE_OTHER): Payer: Self-pay | Admitting: Nurse Practitioner

## 2018-08-16 ENCOUNTER — Other Ambulatory Visit: Payer: Self-pay

## 2018-08-16 VITALS — BP 154/72 | HR 52 | Resp 12 | Ht 60.0 in | Wt 191.0 lb

## 2018-08-16 DIAGNOSIS — I83019 Varicose veins of right lower extremity with ulcer of unspecified site: Secondary | ICD-10-CM | POA: Diagnosis not present

## 2018-08-16 DIAGNOSIS — I10 Essential (primary) hypertension: Secondary | ICD-10-CM

## 2018-08-16 DIAGNOSIS — Z79899 Other long term (current) drug therapy: Secondary | ICD-10-CM

## 2018-08-16 DIAGNOSIS — I890 Lymphedema, not elsewhere classified: Secondary | ICD-10-CM | POA: Diagnosis not present

## 2018-08-16 DIAGNOSIS — L97919 Non-pressure chronic ulcer of unspecified part of right lower leg with unspecified severity: Secondary | ICD-10-CM | POA: Diagnosis not present

## 2018-08-19 ENCOUNTER — Encounter (INDEPENDENT_AMBULATORY_CARE_PROVIDER_SITE_OTHER): Payer: Self-pay | Admitting: Nurse Practitioner

## 2018-08-19 NOTE — Progress Notes (Signed)
SUBJECTIVE:  Patient ID: Debra Butler, female    DOB: Mar 07, 1922, 85 y.o.   MRN: [id number] Chief Complaint  Patient presents with  . Follow-up    HPI  Debra Butler is a 85 y.o. female presents today with complaints of pain in her right foot, as well as lower extremity edema.  The patient was previously in a wraps for several weeks due to bilateral lower extremity ulcerations.  However today her right lower extremity is severely edematous with bruising near the proximal portion of her calf.  She states that it feels as if her foot is "full of water".  She has a small nodule near the middle of her foot which is hard and tender to palpation.  This is been there for some time however recently is just began to get more noticeable and painful.  She denies any fever, chills, nausea, vomiting or diarrhea.  She denies any chest pain or shortness of breath.  Past Medical History:  Diagnosis Date  . Arthritis   . Blood clot in vein 2013   left leg  . Diabetes mellitus without complication ([place]) 9323  . Hypertension 1988  . IBS (irritable bowel syndrome)   . Myocardial infarction [facility])     Past Surgical History:  Procedure Laterality Date  . ABDOMINAL HYSTERECTOMY  age 7   partial, still has ovaries and tubes  . APPENDECTOMY    . BACK SURGERY     screws and titanium  . BREAST BIOPSY     at age 89, benign  . CARPAL TUNNEL RELEASE Right 2010  . CARPAL TUNNEL RELEASE Left 07/25/2017   Procedure: CARPAL TUNNEL RELEASE;  Surgeon: Earnestine Leys, MD;  Location: [facility] ORS;  Service: Orthopedics;  Laterality: Left;  . CHOLECYSTECTOMY    . COLON SURGERY  1984  . COLONOSCOPY  2007, 2016   Dr Jamal Collin  . FOOT SURGERY    . FRACTURE SURGERY  2010   hip  . FRACTURE SURGERY  2009   femur  . JOINT REPLACEMENT     both knee  . PACEMAKER PLACEMENT  2013  . TONSILLECTOMY    . UPPER GI ENDOSCOPY  2016   Dr Jamal Collin    Social History   Socioeconomic History  . Marital status: Married   Spouse name: Not on file  . Number of children: Not on file  . Years of education: Not on file  . Highest education level: Not on file  Occupational History  . Not on file  Social Needs  . Financial resource strain: Not on file  . Food insecurity:    Worry: Not on file    Inability: Not on file  . Transportation needs:    Medical: Not on file    Non-medical: Not on file  Tobacco Use  . Smoking status: Never Smoker  . Smokeless tobacco: Never Used  Substance and Sexual Activity  . Alcohol use: No  . Drug use: No  . Sexual activity: Not on file  Lifestyle  . Physical activity:    Days per week: Not on file    Minutes per session: Not on file  . Stress: Not on file  Relationships  . Social connections:    Talks on phone: Not on file    Gets together: Not on file    Attends religious service: Not on file    Active member of club or organization: Not on file    Attends meetings of clubs or organizations: Not on  file    Relationship status: Not on file  . Intimate partner violence:    Fear of current or ex partner: Not on file    Emotionally abused: Not on file    Physically abused: Not on file    Forced sexual activity: Not on file  Other Topics Concern  . Not on file  Social History Narrative  . Not on file    History reviewed. No pertinent family history.  Allergies  Allergen Reactions  . Sulphur [Sulfur] Hives    And seizure  . Phenergan [Promethazine] Other (See Comments)    Altered mental status     Review of Systems   Review of Systems: Negative Unless Checked Constitutional: [] Weight loss  [] Fever  [] Chills Cardiac: [] Chest pain   []  Atrial Fibrillation  [] Palpitations   [] Shortness of breath when laying flat   [] Shortness of breath with exertion. [] Shortness of breath at rest Vascular:  [] Pain in legs with walking   [] Pain in legs with standing [] Pain in legs when laying flat   [] Claudication    [] Pain in feet when laying flat    [] History of DVT    [] Phlebitis   [x] Swelling in legs   [x] Varicose veins   [] Non-healing ulcers Pulmonary:   [] Uses home oxygen   [] Productive cough   [] Hemoptysis   [] Wheeze  [] COPD   [] Asthma Neurologic:  [] Dizziness   [] Seizures  [] Blackouts [] History of stroke   [] History of TIA  [] Aphasia   [] Temporary Blindness   [] Weakness or numbness in arm   [] Weakness or numbness in leg Musculoskeletal:   [] Joint swelling   [] Joint pain   [] Low back pain  []  History of Knee Replacement [x] Arthritis [] back Surgeries  []  Spinal Stenosis    Hematologic:  [] Easy bruising  [] Easy bleeding   [] Hypercoagulable state   [] Anemic Gastrointestinal:  [] Diarrhea   [] Vomiting  [] Gastroesophageal reflux/heartburn   [] Difficulty swallowing. [] Abdominal pain Genitourinary:  [] Chronic kidney disease   [] Difficult urination  [] Anuric   [] Blood in urine [] Frequent urination  [] Burning with urination   [] Hematuria Skin:  [] Rashes   [] Ulcers [] Wounds Psychological:  [] History of anxiety   []  History of major depression  []  Memory Difficulties      OBJECTIVE:   Physical Exam  BP (!) 154/72 (BP Location: Left Arm, Patient Position: Sitting, Cuff Size: Large)   Pulse (!) 52   Resp 12   Ht 5' (1.524 m)   Wt 191 lb (86.6 kg)   BMI 37.30 kg/m   Gen: WD/WN, NAD Head: [place]/AT, No temporalis wasting.  Ear/Nose/Throat: Hearing grossly intact, nares w/o erythema or drainage Eyes: PER, EOMI, sclera nonicteric.  Neck: Supple, no masses.  No JVD.  Pulmonary:  Good air movement, no use of accessory muscles.  Cardiac: RRR Vascular:  3+ pitting edema on right lower extremity Vessel Right Left  Radial Palpable Palpable  Dorsalis Pedis Palpable Palpable  Posterior Tibial Palpable Palpable   Gastrointestinal: soft, non-distended. No guarding/no peritoneal signs.  Musculoskeletal: M/S 5/5 throughout.  No deformity or atrophy.  Neurologic: Pain and light touch intact in extremities.  Symmetrical.  Speech is fluent. Motor exam as listed above.  Psychiatric: Judgment intact, Mood & affect appropriate for pt's clinical situation. Dermatologic:  Several blisters however not infected. No changes consistent with cellulitis. Lymph : No Cervical lymphadenopathy, no lichenification or skin changes of chronic lymphedema.       ASSESSMENT AND PLAN:  1. Essential hypertension Continue antihypertensive medications as already ordered, these medications have been  reviewed and there are no changes at this time.   2. Lymphedema  No surgery or intervention at this point in time.    I have reviewed my previous discussion with the patient regarding swelling and why it  causes symptoms.  The patient is doing well with compression and will continue wearing graduated compression stockings class 1 (20-30 mmHg) on a daily basis a prescription was given. The patient will  continue wearing the stockings first thing in the morning and removing them in the evening. The patient is instructed specifically not to sleep in the stockings.    In addition, behavioral modification including elevation during the day and exercise will be continued.    While, the patient is in a wraps she will follow conservative therapy methods however once she is out of Unna wraps she will transition back into medical grade 1 compression stockings.  She will continue to wear medical grade 1 compression stockings on her left lower extremity.  3. Venous ulcer of left leg (HCC) No surgery or intervention at this point in time.    I have had a long discussion with the patient regarding venous insufficiency and why it  causes symptoms, specifically venous ulceration . I have discussed with the patient the chronic skin changes that accompany venous insufficiency and the long term sequela such as infection and recurring  ulceration.  Patient will be placed in [employer] which will be changed weekly drainage permitting.  In addition, behavioral modification including several periods of  elevation of the lower extremities during the day will be continued. Achieving a position with the ankles at heart level was stressed to the patient  The patient is instructed to begin routine exercise, especially walking on a daily basis  Patient should undergo duplex ultrasound of the venous system to ensure that DVT or reflux is not present.  Patient is continuing to have issues with mobility therefore we will request home health.  We will have home health come out on a weekly basis to change the wraps and have her follow-up in office within 4 weeks.    Current Outpatient Medications on File Prior to Visit  Medication Sig Dispense Refill  . aspirin 81 MG tablet Take 81 mg by mouth daily.    . cholecalciferol (VITAMIN D) 1000 units tablet Take 1,000 Units by mouth daily.    . cyanocobalamin (,VITAMIN B-12,) 1000 MCG/ML injection Inject 1,000 mcg into the muscle every 30 (thirty) days.    . furosemide (LASIX) 80 MG tablet Take 80 mg by mouth 2 (two) times daily. In the morning & 4pm in the afternoon.    Marland Kitchen glipiZIDE (GLUCOTROL XL) 5 MG 24 hr tablet Take 5 mg by mouth daily.    Marland Kitchen HYDROcodone-acetaminophen (NORCO) 5-325 MG tablet Take 1 tablet by mouth every 6 (six) hours as needed. 40 tablet 0  . levothyroxine (SYNTHROID, LEVOTHROID) 112 MCG tablet Take 112 mcg by mouth daily before breakfast.    . metFORMIN (GLUCOPHAGE) 1000 MG tablet Take 1,000 mg by mouth 2 (two) times daily. With Breakfast & with supper    . nitroGLYCERIN (NITROSTAT) 0.4 MG SL tablet Place 0.4 mg under the tongue every 5 (five) minutes as needed for chest pain.    . potassium chloride SA (K-DUR,KLOR-CON) 20 MEQ tablet Take 20 mEq by mouth 2 (two) times daily.    . Pramox-PE-Glycerin-Petrolatum (PREPARATION H) 1-0.25-14.4-15 % CREA Place 1 application rectally 3 (three) times daily as needed (for rectal discomfort/hemmorroids.).    Marland Kitchen  pregabalin (LYRICA) 100 MG capsule Take 100 mg by mouth 2 (two) times daily.    Marland Kitchen warfarin  (COUMADIN) 6 MG tablet Take 6 mg by mouth daily with supper.    . cephALEXin (KEFLEX) 500 MG capsule Take 1 capsule (500 mg total) by mouth 2 (two) times daily. (Patient not taking: Reported on 08/16/2018) 10 capsule 0   No current facility-administered medications on file prior to visit.     There are no Patient Instructions on file for this visit. No follow-ups on file.   Kris Hartmann, NP  This note was completed with Sales executive.  Any errors are purely unintentional.

## 2018-08-23 ENCOUNTER — Other Ambulatory Visit: Payer: Medicare Other | Admitting: Primary Care

## 2018-08-23 ENCOUNTER — Other Ambulatory Visit: Payer: Self-pay

## 2018-08-23 DIAGNOSIS — I83019 Varicose veins of right lower extremity with ulcer of unspecified site: Secondary | ICD-10-CM

## 2018-08-23 DIAGNOSIS — M79604 Pain in right leg: Secondary | ICD-10-CM

## 2018-08-23 DIAGNOSIS — M79605 Pain in left leg: Secondary | ICD-10-CM

## 2018-08-23 DIAGNOSIS — Z515 Encounter for palliative care: Secondary | ICD-10-CM

## 2018-08-23 DIAGNOSIS — L97919 Non-pressure chronic ulcer of unspecified part of right lower leg with unspecified severity: Secondary | ICD-10-CM

## 2018-08-23 NOTE — Progress Notes (Signed)
[facility] Palliative Care Consult Note Telephone: [phone]  Fax: [phone]  PATIENT NAME: Debra Butler DOB: Jul 10, 1921 MRN: [id number]  PRIMARY CARE PROVIDER:   Lenard Simmer, MD  REFERRING PROVIDER:  Lenard Simmer, MD [address] [place], [place] [address]  RESPONSIBLE PARTY:   Extended Emergency Contact Information Primary Emergency Contact: Mikki Harbor" Address: [address]          [place], [place] [address] Johnnette Litter of [place] Phone: [phone] Work Phone: [phone] Mobile Phone: [phone] Relation: Friend Secondary Emergency Contact: Taylor,Butch Address: [place], [place] [address] Johnnette Litter of [place] Phone: [phone] Mobile Phone: [phone] Relation: Other  Palliative care is providing a consultation for Debra Butler. A caregiver called recently to set up the appointment. Covid screening questions performed before visit.   ASSESSMENT and RECOMMENDATIONS:   1. Incontinence: Describes this as significant problem. Has been to urology but not recently. Would encourage another consultation as she is not taking any urologic medications. She states she uses depends at this time.  2. Muscle strength:  [place] a year ago, hit her head. No falls since.Ambluates with walker in home with pain and instability. Requires stand by assist. Able to get in and out of chair with stand by assistance for safety.May benefit from home health PT if she has not recently had this service.   3. Goals of Care: Caregiver/family  Is asking about hospice to assist in care help.  POA identifies increasing care needs and she is inquiring about additional services. We discussed the purpose of hospice and the certification of terminal illness required to elect services. Debra Butler is elderly but has no clear disease processes that would limit her life in 6 months.  She would be appropriate for  assisted living, or an independent type apartment with additional aide services. Will discuss advance directives on next visit.   4. Financial  Looking for in home care, had hoped she could qualify for hospice services. She would not yet qualify for a limited life span due to disease process. We discussed her POA looking into medicaid qualification. We also discussed PACE as a service. POA will inquire.  5. Pain management: Taking hydrocodone  5/325 3-4 times a day as needed. We discussed the impact pain has on quality of life and mobility.  I would like to increase her pain medication with the goal of improving her function, safety and quality of life but she refuses any increase in her narcotic. She may benefit from an increase in lyrica. She could add 325 mg acetaminophen to her hydrocodone 5/ 325 mg for better pain management. I would suggest she take this daily 4 times a day, and not PRN.   Palliative care will follow for goals of care clarification, symptom management. Return 4 weeks or prn.  I spent 60 minutes providing this consultation,  from 1100 to 1200. More than 50% of the time in this consultation was spent coordinating communication.   HISTORY OF PRESENT ILLNESS:  Debra Butler is a 85 y.o. year old female with multiple medical problems including OA, HTN, IBS, DM. Palliative Care was asked to help address goals of care.   CODE STATUS: FULL PPS: 40% HOSPICE ELIGIBILITY/DIAGNOSIS: TBD  PAST MEDICAL HISTORY:  Past Medical History:  Diagnosis Date  . Arthritis   . Blood clot in vein 2013   left leg  . Diabetes  mellitus without complication ([place]) 9518  . Hypertension 1988  . IBS (irritable bowel syndrome)   . Myocardial infarction [facility])     SOCIAL HX:  Social History   Tobacco Use  . Smoking status: Never Smoker  . Smokeless tobacco: Never Used  Substance Use Topics  . Alcohol use: No    ALLERGIES:  Allergies  Allergen Reactions  . Sulphur [Sulfur] Hives    And  seizure  . Phenergan [Promethazine] Other (See Comments)    Altered mental status     PERTINENT MEDICATIONS:  Outpatient Encounter Medications as of 08/23/2018  Medication Sig  . aspirin 81 MG tablet Take 81 mg by mouth daily.  . cephALEXin (KEFLEX) 500 MG capsule Take 1 capsule (500 mg total) by mouth 2 (two) times daily. (Patient not taking: Reported on 08/16/2018)  . cholecalciferol (VITAMIN D) 1000 units tablet Take 1,000 Units by mouth daily.  . cyanocobalamin (,VITAMIN B-12,) 1000 MCG/ML injection Inject 1,000 mcg into the muscle every 30 (thirty) days.  . furosemide (LASIX) 80 MG tablet Take 80 mg by mouth 2 (two) times daily. In the morning & 4pm in the afternoon.  Marland Kitchen glipiZIDE (GLUCOTROL XL) 5 MG 24 hr tablet Take 5 mg by mouth daily.  Marland Kitchen HYDROcodone-acetaminophen (NORCO) 5-325 MG tablet Take 1 tablet by mouth every 6 (six) hours as needed.  Marland Kitchen levothyroxine (SYNTHROID, LEVOTHROID) 112 MCG tablet Take 112 mcg by mouth daily before breakfast.  . metFORMIN (GLUCOPHAGE) 1000 MG tablet Take 1,000 mg by mouth 2 (two) times daily. With Breakfast & with supper  . nitroGLYCERIN (NITROSTAT) 0.4 MG SL tablet Place 0.4 mg under the tongue every 5 (five) minutes as needed for chest pain.  . potassium chloride SA (K-DUR,KLOR-CON) 20 MEQ tablet Take 20 mEq by mouth 2 (two) times daily.  . Pramox-PE-Glycerin-Petrolatum (PREPARATION H) 1-0.25-14.4-15 % CREA Place 1 application rectally 3 (three) times daily as needed (for rectal discomfort/hemmorroids.).  Marland Kitchen pregabalin (LYRICA) 100 MG capsule Take 100 mg by mouth 2 (two) times daily.  Marland Kitchen warfarin (COUMADIN) 6 MG tablet Take 6 mg by mouth daily with supper.   No facility-administered encounter medications on file as of 08/23/2018.     PHYSICAL EXAM:  HR 76 Resp. 18  Weight 201 lb , stable  General: NAD, frail appearing,obese, appetite fair Cardiovascular: regular rate and rhythm, pacemaker, S1S2 Pulmonary: clear all  Fields, never smoked, no cough  Abdomen: soft, nontender, + bowel sounds, nausea ,endorses loose stools GU: no suprapubic tenderness,endorses  incontinence Extremities: 1+ edema, joint deformities 2/2   OA Skin: no rashes, has painful lumps over body, right LE wound managed by home health, healing well Neurological: Weakness , good historian, c/o pain which impedes movement.  Cyndia Skeeters DNP, AGPCNP-BC

## 2018-08-27 ENCOUNTER — Telehealth (INDEPENDENT_AMBULATORY_CARE_PROVIDER_SITE_OTHER): Payer: Self-pay

## 2018-08-27 NOTE — Telephone Encounter (Signed)
I spoke with [name] from [facility] and she stated that one of the nurses had some of the supplies and was able to give some to patient assign nurse but the nurse is aware of what Arna Medici NP recommended

## 2018-08-27 NOTE — Telephone Encounter (Signed)
What supplies do they have? The could do compression wraps with kerlix and coband if they have it

## 2018-09-10 ENCOUNTER — Encounter (INDEPENDENT_AMBULATORY_CARE_PROVIDER_SITE_OTHER): Payer: Self-pay | Admitting: Vascular Surgery

## 2018-09-10 ENCOUNTER — Other Ambulatory Visit: Payer: Self-pay

## 2018-09-10 ENCOUNTER — Ambulatory Visit (INDEPENDENT_AMBULATORY_CARE_PROVIDER_SITE_OTHER): Payer: Medicare Other | Admitting: Vascular Surgery

## 2018-09-10 VITALS — BP 133/78 | HR 98 | Resp 16

## 2018-09-10 DIAGNOSIS — I10 Essential (primary) hypertension: Secondary | ICD-10-CM

## 2018-09-10 DIAGNOSIS — E119 Type 2 diabetes mellitus without complications: Secondary | ICD-10-CM

## 2018-09-10 DIAGNOSIS — I890 Lymphedema, not elsewhere classified: Secondary | ICD-10-CM | POA: Diagnosis not present

## 2018-09-10 DIAGNOSIS — I83019 Varicose veins of right lower extremity with ulcer of unspecified site: Secondary | ICD-10-CM

## 2018-09-10 DIAGNOSIS — M7989 Other specified soft tissue disorders: Secondary | ICD-10-CM | POA: Diagnosis not present

## 2018-09-10 DIAGNOSIS — L97919 Non-pressure chronic ulcer of unspecified part of right lower leg with unspecified severity: Secondary | ICD-10-CM

## 2018-09-10 NOTE — Progress Notes (Signed)
MRN : [id number]  Debra Butler is a 85 y.o. (May 12, 1922) female who presents with chief complaint of  Chief Complaint  Patient presents with  . Follow-up     unna check  .  History of Present Illness: Patient returns today in follow up of her leg swelling and ulceration.  She is doing well today.  She has been in [employer] for several months but has healed her ulcerations and her swelling is improved although still significant.  She is wearing compression socks today and has no weeping or drainage.  No erythema or signs of infection.  No fevers or chills.  Current Outpatient Medications  Medication Sig Dispense Refill  . aspirin 81 MG tablet Take 81 mg by mouth daily.    . cholecalciferol (VITAMIN D) 1000 units tablet Take 1,000 Units by mouth daily.    . cyanocobalamin (,VITAMIN B-12,) 1000 MCG/ML injection Inject 1,000 mcg into the muscle every 30 (thirty) days.    . furosemide (LASIX) 80 MG tablet Take 80 mg by mouth 2 (two) times daily. In the morning & 4pm in the afternoon.    Marland Kitchen glipiZIDE (GLUCOTROL XL) 5 MG 24 hr tablet Take 5 mg by mouth daily.    Marland Kitchen HYDROcodone-acetaminophen (NORCO) 5-325 MG tablet Take 1 tablet by mouth every 6 (six) hours as needed. 40 tablet 0  . levothyroxine (SYNTHROID, LEVOTHROID) 112 MCG tablet Take 112 mcg by mouth daily before breakfast.    . metFORMIN (GLUCOPHAGE) 1000 MG tablet Take 1,000 mg by mouth 2 (two) times daily. With Breakfast & with supper    . nitroGLYCERIN (NITROSTAT) 0.4 MG SL tablet Place 0.4 mg under the tongue every 5 (five) minutes as needed for chest pain.    . potassium chloride SA (K-DUR,KLOR-CON) 20 MEQ tablet Take 20 mEq by mouth 2 (two) times daily.    . Pramox-PE-Glycerin-Petrolatum (PREPARATION H) 1-0.25-14.4-15 % CREA Place 1 application rectally 3 (three) times daily as needed (for rectal discomfort/hemmorroids.).    Marland Kitchen pregabalin (LYRICA) 100 MG capsule Take 100 mg by mouth 2 (two) times daily.    Marland Kitchen warfarin (COUMADIN) 6  MG tablet Take 6 mg by mouth daily with supper.    . cephALEXin (KEFLEX) 500 MG capsule Take 1 capsule (500 mg total) by mouth 2 (two) times daily. (Patient not taking: Reported on 08/16/2018) 10 capsule 0   No current facility-administered medications for this visit.     Past Medical History:  Diagnosis Date  . Arthritis   . Blood clot in vein 2013   left leg  . Diabetes mellitus without complication ([place]) 7902  . Hypertension 1988  . IBS (irritable bowel syndrome)   . Myocardial infarction [facility])     Past Surgical History:  Procedure Laterality Date  . ABDOMINAL HYSTERECTOMY  age 59   partial, still has ovaries and tubes  . APPENDECTOMY    . BACK SURGERY     screws and titanium  . BREAST BIOPSY     at age 60, benign  . CARPAL TUNNEL RELEASE Right 2010  . CARPAL TUNNEL RELEASE Left 07/25/2017   Procedure: CARPAL TUNNEL RELEASE;  Surgeon: Earnestine Leys, MD;  Location: [facility] ORS;  Service: Orthopedics;  Laterality: Left;  . CHOLECYSTECTOMY    . COLON SURGERY  1984  . COLONOSCOPY  2007, 2016   Dr Jamal Collin  . FOOT SURGERY    . FRACTURE SURGERY  2010   hip  . FRACTURE SURGERY  2009   femur  .  JOINT REPLACEMENT     both knee  . PACEMAKER PLACEMENT  2013  . TONSILLECTOMY    . UPPER GI ENDOSCOPY  2016   Dr Jamal Collin   Social History  Substance Use Topics  . Smoking status: Never Smoker  . Smokeless tobacco: Never Used  . Alcohol use No    Family History No bleeding or clotting disorders       Allergies  Allergen Reactions  . Phenergan [Promethazine] Other (See Comments)    Altered mental status  . Sulphur [Sulfur]      REVIEW OF SYSTEMS(Negative unless checked)  Constitutional: [] ?Weight loss[] ?Fever[] ?Chills Cardiac:[] ?Chest pain[] ?Chest pressure[x] ?Palpitations [] ?Shortness of breath when laying flat [] ?Shortness of breath at rest [] ?Shortness of breath with exertion. Vascular: [] ?Pain in legs with walking[] ?Pain in legsat  rest[] ?Pain in legs when laying flat [] ?Claudication [] ?Pain in feet when walking [] ?Pain in feet at rest [] ?Pain in feet when laying flat [x] ?History of DVT [] ?Phlebitis [x] ?Swelling in legs [] ?Varicose veins [x] ?Non-healing ulcers Pulmonary: [] ?Uses home oxygen [] ?Productive cough[] ?Hemoptysis [] ?Wheeze [] ?COPD [] ?Asthma Neurologic: [] ?Dizziness [] ?Blackouts [] ?Seizures [] ?History of stroke [] ?History of TIA[] ?Aphasia [] ?Temporary blindness[] ?Dysphagia [] ?Weaknessor numbness in arms [] ?Weakness or numbnessin legs Musculoskeletal: [x] ?Arthritis [] ?Joint swelling [] ?Joint pain [] ?Low back pain Hematologic:[] ?Easy bruising[] ?Easy bleeding [] ?Hypercoagulable state [] ?Anemic  Gastrointestinal:[] ?Blood in stool[] ?Vomiting blood[] ?Gastroesophageal reflux/heartburn[] ?Abdominal pain Genitourinary: [] ?Chronic kidney disease [] ?Difficulturination [] ?Frequenturination [] ?Burning with urination[] ?Hematuria Skin: [] ?Rashes [x] ?Ulcers [x] ?Wounds Psychological: [] ?History of anxiety[] ?History of major depression.    Physical Examination  BP 133/78 (BP Location: Left Arm)   Pulse 98   Resp 16  Gen:  WD/WN, NAD Head: [place]/AT, No temporalis wasting. Ear/Nose/Throat: Hearing grossly intact, nares w/o erythema or drainage Eyes: Conjunctiva clear. Sclera non-icteric Neck: Supple.  Trachea midline Pulmonary:  Good air movement, no use of accessory muscles.  Cardiac: RRR, no JVD Vascular:  Vessel Right Left  Radial Palpable Palpable                          PT Palpable Palpable  DP Palpable Palpable    Musculoskeletal: M/S 5/5 throughout.  No deformity or atrophy.  Previous ulceration on the right calf has healed.  1-2+ right lower extremity edema, 1-2+ left lower extremity edema. Neurologic: Sensation grossly intact in extremities.  Symmetrical.  Speech is fluent.  Psychiatric: Judgment intact, Mood &  affect appropriate for pt's clinical situation. Dermatologic: No rashes or ulcers noted.  No cellulitis or open wounds.       Labs No results found for this or any previous visit (from the past 2160 hour(s)).  Radiology No results found.  Assessment/Plan Diabetes (HCC) blood glucose control important in reducing the progression of atherosclerotic disease. Also, involved in wound healing. On appropriate medications.  Hypertension blood pressure control important in reducing the progression of atherosclerotic disease. On appropriate oral medications.  Venous ulcer of right leg (HCC) Previous ulcer has healed.  Were going to try coming out of Unna boots but if it recurs, she will contact our office and we will replace the Unna boots.  Lymphedema She has an exercise machine she is going to use to try to get more movement in her legs.  She will continue compression and elevation.  Swelling of limb Improved but not entirely resolved.  Try to elevate as much as possible and continue using her compression stockings.  If she has recurrent weeping or ulceration, we will replace Unna boots.  Otherwise we will recheck her in 2 to 3 months.    Leotis Pain, MD  09/10/2018 10:57  AM    This note was created with Dragon medical transcription system.  Any errors from dictation are purely unintentional

## 2018-09-10 NOTE — Assessment & Plan Note (Signed)
Improved but not entirely resolved.  Try to elevate as much as possible and continue using her compression stockings.  If she has recurrent weeping or ulceration, we will replace Unna boots.  Otherwise we will recheck her in 2 to 3 months.

## 2018-09-10 NOTE — Assessment & Plan Note (Signed)
She has an exercise machine she is going to use to try to get more movement in her legs.  She will continue compression and elevation.

## 2018-09-10 NOTE — Assessment & Plan Note (Signed)
Previous ulcer has healed.  Were going to try coming out of Unna boots but if it recurs, she will contact our office and we will replace the Unna boots.

## 2018-09-13 ENCOUNTER — Ambulatory Visit (INDEPENDENT_AMBULATORY_CARE_PROVIDER_SITE_OTHER): Payer: Medicare Other | Admitting: Nurse Practitioner

## 2018-09-20 ENCOUNTER — Telehealth: Payer: Self-pay | Admitting: Primary Care

## 2018-09-20 NOTE — Telephone Encounter (Signed)
T/c to caregiver, she states they are not needing a visit at this time. She is being followed by MD for pain. Caregiver states she would like a call back in 8-12 weeks to check in on patient's status.

## 2018-10-15 ENCOUNTER — Other Ambulatory Visit: Payer: Self-pay

## 2018-10-15 ENCOUNTER — Encounter (INDEPENDENT_AMBULATORY_CARE_PROVIDER_SITE_OTHER): Payer: Self-pay | Admitting: Vascular Surgery

## 2018-10-15 ENCOUNTER — Ambulatory Visit (INDEPENDENT_AMBULATORY_CARE_PROVIDER_SITE_OTHER): Payer: Medicare Other | Admitting: Vascular Surgery

## 2018-10-15 VITALS — BP 146/90 | HR 96 | Resp 16

## 2018-10-15 DIAGNOSIS — Z7984 Long term (current) use of oral hypoglycemic drugs: Secondary | ICD-10-CM

## 2018-10-15 DIAGNOSIS — I890 Lymphedema, not elsewhere classified: Secondary | ICD-10-CM

## 2018-10-15 DIAGNOSIS — I10 Essential (primary) hypertension: Secondary | ICD-10-CM

## 2018-10-15 DIAGNOSIS — Z79899 Other long term (current) drug therapy: Secondary | ICD-10-CM

## 2018-10-15 DIAGNOSIS — R600 Localized edema: Secondary | ICD-10-CM

## 2018-10-15 DIAGNOSIS — I83019 Varicose veins of right lower extremity with ulcer of unspecified site: Secondary | ICD-10-CM | POA: Diagnosis not present

## 2018-10-15 DIAGNOSIS — L97919 Non-pressure chronic ulcer of unspecified part of right lower leg with unspecified severity: Secondary | ICD-10-CM

## 2018-10-15 DIAGNOSIS — M7989 Other specified soft tissue disorders: Secondary | ICD-10-CM

## 2018-10-15 DIAGNOSIS — E119 Type 2 diabetes mellitus without complications: Secondary | ICD-10-CM | POA: Diagnosis not present

## 2018-10-15 NOTE — Patient Instructions (Signed)

## 2018-10-15 NOTE — Assessment & Plan Note (Signed)
Unna boot placed today.  Swelling has been reasonably well-controlled until this large blister developed.

## 2018-10-15 NOTE — Assessment & Plan Note (Signed)
A 3 layer Unna boot was placed today.  We will continue to wrap this weekly for 2 to 3 weeks and then recheck her leg and see if it has healed.

## 2018-10-15 NOTE — Progress Notes (Signed)
MRN : [id number]  Debra Butler is a 85 y.o. (05/14/1922) female who presents with chief complaint of  Chief Complaint  Patient presents with  . Follow-up    right leg blister  .  History of Present Illness: Patient returns today in follow up of her right leg swelling and lymphedema.  Her leg was doing really well until yesterday when a blister developed on the anterior right lower leg.  It has gotten quite large.  It is basically the size of a small plate at this point.  Not really that painful to her.  She is a very tough woman.  No fevers or chills.  Current Outpatient Medications  Medication Sig Dispense Refill  . aspirin 81 MG tablet Take 81 mg by mouth daily.    . cholecalciferol (VITAMIN D) 1000 units tablet Take 1,000 Units by mouth daily.    . cyanocobalamin (,VITAMIN B-12,) 1000 MCG/ML injection Inject 1,000 mcg into the muscle every 30 (thirty) days.    . furosemide (LASIX) 80 MG tablet Take 80 mg by mouth 2 (two) times daily. In the morning & 4pm in the afternoon.    Marland Kitchen glipiZIDE (GLUCOTROL XL) 5 MG 24 hr tablet Take 5 mg by mouth daily.    Marland Kitchen levothyroxine (SYNTHROID, LEVOTHROID) 112 MCG tablet Take 112 mcg by mouth daily before breakfast.    . metFORMIN (GLUCOPHAGE) 1000 MG tablet Take 1,000 mg by mouth 2 (two) times daily. With Breakfast & with supper    . nitroGLYCERIN (NITROSTAT) 0.4 MG SL tablet Place 0.4 mg under the tongue every 5 (five) minutes as needed for chest pain.    . potassium chloride SA (K-DUR,KLOR-CON) 20 MEQ tablet Take 20 mEq by mouth 2 (two) times daily.    . Pramox-PE-Glycerin-Petrolatum (PREPARATION H) 1-0.25-14.4-15 % CREA Place 1 application rectally 3 (three) times daily as needed (for rectal discomfort/hemmorroids.).    Marland Kitchen pregabalin (LYRICA) 100 MG capsule Take 100 mg by mouth 2 (two) times daily.    Marland Kitchen warfarin (COUMADIN) 6 MG tablet Take 6 mg by mouth daily with supper.    . cephALEXin (KEFLEX) 500 MG capsule Take 1 capsule (500 mg total) by  mouth 2 (two) times daily. (Patient not taking: Reported on 08/16/2018) 10 capsule 0  . HYDROcodone-acetaminophen (NORCO) 5-325 MG tablet Take 1 tablet by mouth every 6 (six) hours as needed. (Patient not taking: Reported on 10/15/2018) 40 tablet 0   No current facility-administered medications for this visit.     Past Medical History:  Diagnosis Date  . Arthritis   . Blood clot in vein 2013   left leg  . Diabetes mellitus without complication ([place]) 4008  . Hypertension 1988  . IBS (irritable bowel syndrome)   . Myocardial infarction [facility])     Past Surgical History:  Procedure Laterality Date  . ABDOMINAL HYSTERECTOMY  age 79   partial, still has ovaries and tubes  . APPENDECTOMY    . BACK SURGERY     screws and titanium  . BREAST BIOPSY     at age 64, benign  . CARPAL TUNNEL RELEASE Right 2010  . CARPAL TUNNEL RELEASE Left 07/25/2017   Procedure: CARPAL TUNNEL RELEASE;  Surgeon: Earnestine Leys, MD;  Location: [facility] ORS;  Service: Orthopedics;  Laterality: Left;  . CHOLECYSTECTOMY    . COLON SURGERY  1984  . COLONOSCOPY  2007, 2016   Dr Jamal Collin  . FOOT SURGERY    . FRACTURE SURGERY  2010   hip  .  FRACTURE SURGERY  2009   femur  . JOINT REPLACEMENT     both knee  . PACEMAKER PLACEMENT  2013  . TONSILLECTOMY    . UPPER GI ENDOSCOPY  2016   Dr Jamal Collin    Social History  Substance Use Topics  . Smoking status: Never Smoker  . Smokeless tobacco: Never Used  . Alcohol use No    Family History No bleeding or clotting disorders       Allergies  Allergen Reactions  . Phenergan [Promethazine] Other (See Comments)    Altered mental status  . Sulphur [Sulfur]      REVIEW OF SYSTEMS(Negative unless checked)  Constitutional: [] ??Weight loss[] ??Fever[] ??Chills Cardiac:[] ??Chest pain[] ??Chest pressure[x] ??Palpitations [] ??Shortness of breath when laying flat [] ??Shortness of breath at rest [] ??Shortness of breath with exertion. Vascular:  [] ??Pain in legs with walking[] ??Pain in legsat rest[] ??Pain in legs when laying flat [] ??Claudication [] ??Pain in feet when walking [] ??Pain in feet at rest [] ??Pain in feet when laying flat [x] ??History of DVT [] ??Phlebitis [x] ??Swelling in legs [] ??Varicose veins [x] ??Non-healing ulcers Pulmonary: [] ??Uses home oxygen [] ??Productive cough[] ??Hemoptysis [] ??Wheeze [] ??COPD [] ??Asthma Neurologic: [] ??Dizziness [] ??Blackouts [] ??Seizures [] ??History of stroke [] ??History of TIA[] ??Aphasia [] ??Temporary blindness[] ??Dysphagia [] ??Weaknessor numbness in arms [] ??Weakness or numbnessin legs Musculoskeletal: [x] ??Arthritis [] ??Joint swelling [] ??Joint pain [] ??Low back pain Hematologic:[] ??Easy bruising[] ??Easy bleeding [] ??Hypercoagulable state [] ??Anemic  Gastrointestinal:[] ??Blood in stool[] ??Vomiting blood[] ??Gastroesophageal reflux/heartburn[] ??Abdominal pain Genitourinary: [] ??Chronic kidney disease [] ??Difficulturination [] ??Frequenturination [] ??Burning with urination[] ??Hematuria Skin: [] ??Rashes [x] ??Ulcers [x] ??Wounds Psychological: [] ??History of anxiety[] ??History of major depression.    Physical Examination  BP (!) 146/90 (BP Location: Right Arm)   Pulse 96   Resp 16  Gen:  WD/WN, NAD Head: [place]/AT, No temporalis wasting. Ear/Nose/Throat: Hearing grossly intact, nares w/o erythema or drainage Eyes: Conjunctiva clear. Sclera non-icteric Neck: Supple.  Trachea midline Pulmonary:  Good air movement, no use of accessory muscles.  Cardiac: Irregular Vascular:  Vessel Right Left  Radial Palpable Palpable                       Musculoskeletal: M/S 5/5 throughout.  No deformity or atrophy.  1+ bilateral lower extremity edema.  There is a large blister on the right lower leg that is basically the size of a small plate. Neurologic: Sensation grossly intact in extremities.   Symmetrical.  Speech is fluent.  Psychiatric: Judgment intact, Mood & affect appropriate for pt's clinical situation. Dermatologic: No rashes or ulcers noted.  No cellulitis or open wounds.       Labs No results found for this or any previous visit (from the past 2160 hour(s)).  Radiology No results found.  Assessment/Plan Diabetes (HCC) blood glucose control important in reducing the progression of atherosclerotic disease. Also, involved in wound healing. On appropriate medications.  Hypertension blood pressure control important in reducing the progression of atherosclerotic disease. On appropriate oral medications.  Swelling of limb Unna boot placed today.  Swelling has been reasonably well-controlled until this large blister developed.  Venous ulcer of right leg (HCC) A 3 layer Unna boot was placed today.  We will continue to wrap this weekly for 2 to 3 weeks and then recheck her leg and see if it has healed.    Leotis Pain, MD  10/15/2018 1:39 PM    This note was created with Dragon medical transcription system.  Any errors from dictation are purely unintentional

## 2018-10-22 ENCOUNTER — Other Ambulatory Visit: Payer: Self-pay

## 2018-10-22 ENCOUNTER — Ambulatory Visit (INDEPENDENT_AMBULATORY_CARE_PROVIDER_SITE_OTHER): Payer: Medicare Other | Admitting: Nurse Practitioner

## 2018-10-22 ENCOUNTER — Encounter (INDEPENDENT_AMBULATORY_CARE_PROVIDER_SITE_OTHER): Payer: Self-pay

## 2018-10-22 VITALS — BP 127/71 | HR 84 | Resp 16

## 2018-10-22 DIAGNOSIS — I83019 Varicose veins of right lower extremity with ulcer of unspecified site: Secondary | ICD-10-CM | POA: Diagnosis not present

## 2018-10-22 DIAGNOSIS — L97919 Non-pressure chronic ulcer of unspecified part of right lower leg with unspecified severity: Secondary | ICD-10-CM

## 2018-10-22 NOTE — Progress Notes (Signed)
History of Present Illness  There is no documented history at this time  Assessments & Plan   There are no diagnoses linked to this encounter.    Additional instructions  Subjective:  Patient presents with venous ulcer of the Right lower extremity.    Procedure:  3 layer unna wrap was placed Right lower extremity.   Plan:   Follow up in one week.   

## 2018-10-25 ENCOUNTER — Other Ambulatory Visit: Payer: Self-pay

## 2018-10-25 ENCOUNTER — Encounter (INDEPENDENT_AMBULATORY_CARE_PROVIDER_SITE_OTHER): Payer: Self-pay

## 2018-10-25 ENCOUNTER — Telehealth (INDEPENDENT_AMBULATORY_CARE_PROVIDER_SITE_OTHER): Payer: Self-pay

## 2018-10-25 ENCOUNTER — Ambulatory Visit (INDEPENDENT_AMBULATORY_CARE_PROVIDER_SITE_OTHER): Payer: Medicare Other | Admitting: Nurse Practitioner

## 2018-10-25 VITALS — BP 125/75 | HR 76 | Resp 16

## 2018-10-25 DIAGNOSIS — L03115 Cellulitis of right lower limb: Secondary | ICD-10-CM

## 2018-10-25 DIAGNOSIS — L97919 Non-pressure chronic ulcer of unspecified part of right lower leg with unspecified severity: Secondary | ICD-10-CM | POA: Diagnosis not present

## 2018-10-25 MED ORDER — CEPHALEXIN 500 MG PO CAPS: 500.0000 mg | ORAL_CAPSULE | Freq: Two times a day (BID) | ORAL | 0 refills | Status: DC

## 2018-10-25 NOTE — Progress Notes (Signed)
History of Present Illness  There is no documented history at this time  Assessments & Plan   1. Cellulitis of right lower extremity Will reassess at next unna wrap visit - cephALEXin (KEFLEX) 500 MG capsule; Take 1 capsule (500 mg total) by mouth 2 (two) times daily.  Dispense: 20 capsule; Refill: 0     Additional instructions  Subjective:  Patient presents with venous ulcer of the Right lower extremity.    Procedure:  3 layer unna wrap was placed Right lower extremity.   Plan:   Follow up in one week.

## 2018-10-29 ENCOUNTER — Ambulatory Visit (INDEPENDENT_AMBULATORY_CARE_PROVIDER_SITE_OTHER): Payer: Medicare Other | Admitting: Nurse Practitioner

## 2018-10-29 ENCOUNTER — Encounter (INDEPENDENT_AMBULATORY_CARE_PROVIDER_SITE_OTHER): Payer: Self-pay

## 2018-10-29 ENCOUNTER — Other Ambulatory Visit: Payer: Self-pay

## 2018-10-29 VITALS — BP 113/68 | HR 86 | Resp 16

## 2018-10-29 DIAGNOSIS — I83019 Varicose veins of right lower extremity with ulcer of unspecified site: Secondary | ICD-10-CM | POA: Diagnosis not present

## 2018-10-29 DIAGNOSIS — L97919 Non-pressure chronic ulcer of unspecified part of right lower leg with unspecified severity: Secondary | ICD-10-CM

## 2018-10-29 NOTE — Progress Notes (Signed)
History of Present Illness  There is no documented history at this time  Assessments & Plan   There are no diagnoses linked to this encounter.    Additional instructions  Subjective:  Patient presents with venous ulcer of the Right lower extremity.    Procedure:  3 layer unna wrap was placed Right lower extremity.   Plan:   Follow up in one week.   

## 2018-11-05 ENCOUNTER — Ambulatory Visit (INDEPENDENT_AMBULATORY_CARE_PROVIDER_SITE_OTHER): Payer: Medicare Other | Admitting: Vascular Surgery

## 2018-11-05 ENCOUNTER — Other Ambulatory Visit: Payer: Self-pay

## 2018-11-05 ENCOUNTER — Encounter (INDEPENDENT_AMBULATORY_CARE_PROVIDER_SITE_OTHER): Payer: Self-pay | Admitting: Vascular Surgery

## 2018-11-05 VITALS — BP 121/83 | HR 108 | Resp 12 | Ht 60.0 in | Wt 201.0 lb

## 2018-11-05 DIAGNOSIS — Z7984 Long term (current) use of oral hypoglycemic drugs: Secondary | ICD-10-CM

## 2018-11-05 DIAGNOSIS — E119 Type 2 diabetes mellitus without complications: Secondary | ICD-10-CM | POA: Diagnosis not present

## 2018-11-05 DIAGNOSIS — L97919 Non-pressure chronic ulcer of unspecified part of right lower leg with unspecified severity: Secondary | ICD-10-CM

## 2018-11-05 DIAGNOSIS — I890 Lymphedema, not elsewhere classified: Secondary | ICD-10-CM

## 2018-11-05 DIAGNOSIS — I10 Essential (primary) hypertension: Secondary | ICD-10-CM | POA: Diagnosis not present

## 2018-11-05 DIAGNOSIS — I83019 Varicose veins of right lower extremity with ulcer of unspecified site: Secondary | ICD-10-CM

## 2018-11-05 DIAGNOSIS — Z79899 Other long term (current) drug therapy: Secondary | ICD-10-CM

## 2018-11-05 NOTE — Assessment & Plan Note (Signed)
Swelling under good control with Unna boot but there is still skin breakdown.  We will continue the Unna boot for that reason.

## 2018-11-05 NOTE — Progress Notes (Signed)
MRN : [id number]  Debra Butler is a 85 y.o. (1921/07/13) female who presents with chief complaint of  Chief Complaint  Patient presents with  . Follow-up  .  History of Present Illness: Patient returns today in follow up of her right leg ulceration, leg swelling and lymphedema.  She has now been in [employer] for 3 weeks and the right leg is much better but not entirely healed.  Her left leg swelling is stable with her compression stockings.  We have not been doing Unna boots there as there is no skin breakdown.  The right leg is still painful.  No fevers or chills.  No signs of systemic infection.  Current Outpatient Medications  Medication Sig Dispense Refill  . aspirin 81 MG tablet Take 81 mg by mouth daily.    . cephALEXin (KEFLEX) 500 MG capsule Take 1 capsule (500 mg total) by mouth 2 (two) times daily. 20 capsule 0  . cholecalciferol (VITAMIN D) 1000 units tablet Take 1,000 Units by mouth daily.    . cyanocobalamin (,VITAMIN B-12,) 1000 MCG/ML injection Inject 1,000 mcg into the muscle every 30 (thirty) days.    . furosemide (LASIX) 80 MG tablet Take 80 mg by mouth 2 (two) times daily. In the morning & 4pm in the afternoon.    Marland Kitchen glipiZIDE (GLUCOTROL XL) 5 MG 24 hr tablet Take 5 mg by mouth daily.    Marland Kitchen levothyroxine (SYNTHROID, LEVOTHROID) 112 MCG tablet Take 112 mcg by mouth daily before breakfast.    . metFORMIN (GLUCOPHAGE) 1000 MG tablet Take 1,000 mg by mouth 2 (two) times daily. With Breakfast & with supper    . nitroGLYCERIN (NITROSTAT) 0.4 MG SL tablet Place 0.4 mg under the tongue every 5 (five) minutes as needed for chest pain.    . potassium chloride SA (K-DUR,KLOR-CON) 20 MEQ tablet Take 20 mEq by mouth 2 (two) times daily.    . Pramox-PE-Glycerin-Petrolatum (PREPARATION H) 1-0.25-14.4-15 % CREA Place 1 application rectally 3 (three) times daily as needed (for rectal discomfort/hemmorroids.).    Marland Kitchen pregabalin (LYRICA) 100 MG capsule Take 100 mg by mouth 2 (two) times  daily.    . traMADol (ULTRAM) 50 MG tablet Take by mouth every 6 (six) hours as needed.    . warfarin (COUMADIN) 6 MG tablet Take 6 mg by mouth daily with supper.    Marland Kitchen HYDROcodone-acetaminophen (NORCO) 5-325 MG tablet Take 1 tablet by mouth every 6 (six) hours as needed. (Patient not taking: Reported on 10/15/2018) 40 tablet 0   No current facility-administered medications for this visit.     Past Medical History:  Diagnosis Date  . Arthritis   . Blood clot in vein 2013   left leg  . Diabetes mellitus without complication ([place]) 4259  . Hypertension 1988  . IBS (irritable bowel syndrome)   . Myocardial infarction [facility])     Past Surgical History:  Procedure Laterality Date  . ABDOMINAL HYSTERECTOMY  age 71   partial, still has ovaries and tubes  . APPENDECTOMY    . BACK SURGERY     screws and titanium  . BREAST BIOPSY     at age 54, benign  . CARPAL TUNNEL RELEASE Right 2010  . CARPAL TUNNEL RELEASE Left 07/25/2017   Procedure: CARPAL TUNNEL RELEASE;  Surgeon: Earnestine Leys, MD;  Location: [facility] ORS;  Service: Orthopedics;  Laterality: Left;  . CHOLECYSTECTOMY    . COLON SURGERY  1984  . COLONOSCOPY  2007, 2016  Dr Jamal Collin  . FOOT SURGERY    . FRACTURE SURGERY  2010   hip  . FRACTURE SURGERY  2009   femur  . JOINT REPLACEMENT     both knee  . PACEMAKER PLACEMENT  2013  . TONSILLECTOMY    . UPPER GI ENDOSCOPY  2016   Dr Jamal Collin   Social History  Substance Use Topics  . Smoking status: Never Smoker  . Smokeless tobacco: Never Used  . Alcohol use No    Family History No bleeding or clotting disorders       Allergies  Allergen Reactions  . Phenergan [Promethazine] Other (See Comments)    Altered mental status  . Sulphur [Sulfur]      REVIEW OF SYSTEMS(Negative unless checked)  Constitutional: [] ???Weight loss[] ???Fever[] ???Chills Cardiac:[] ???Chest pain[] ???Chest pressure[x] ???Palpitations [] ???Shortness of breath when laying  flat [] ???Shortness of breath at rest [] ???Shortness of breath with exertion. Vascular: [] ???Pain in legs with walking[] ???Pain in legsat rest[] ???Pain in legs when laying flat [] ???Claudication [] ???Pain in feet when walking [] ???Pain in feet at rest [] ???Pain in feet when laying flat [x] ???History of DVT [] ???Phlebitis [x] ???Swelling in legs [] ???Varicose veins [x] ???Non-healing ulcers Pulmonary: [] ???Uses home oxygen [] ???Productive cough[] ???Hemoptysis [] ???Wheeze [] ???COPD [] ???Asthma Neurologic: [] ???Dizziness [] ???Blackouts [] ???Seizures [] ???History of stroke [] ???History of TIA[] ???Aphasia [] ???Temporary blindness[] ???Dysphagia [] ???Weaknessor numbness in arms [] ???Weakness or numbnessin legs Musculoskeletal: [x] ???Arthritis [] ???Joint swelling [] ???Joint pain [] ???Low back pain Hematologic:[] ???Easy bruising[] ???Easy bleeding [] ???Hypercoagulable state [] ???Anemic  Gastrointestinal:[] ???Blood in stool[] ???Vomiting blood[] ???Gastroesophageal reflux/heartburn[] ???Abdominal pain Genitourinary: [] ???Chronic kidney disease [] ???Difficulturination [] ???Frequenturination [] ???Burning with urination[] ???Hematuria Skin: [] ???Rashes [x] ???Ulcers [x] ???Wounds Psychological: [] ???History of anxiety[] ???History of major depression.    Physical Examination  BP 121/83 (BP Location: Left Arm, Patient Position: Sitting, Cuff Size: Large)   Pulse (!) 108   Resp 12   Ht 5' (1.524 m)   Wt 201 lb (91.2 kg)   BMI 39.26 kg/m  Gen:  WD/WN, NAD Head: [place]/AT, No temporalis wasting. Ear/Nose/Throat: Hearing grossly intact, nares w/o erythema or drainage Eyes: Conjunctiva clear. Sclera non-icteric Neck: Supple.  Trachea midline Pulmonary:  Good air movement, no use of accessory muscles.  Cardiac: RRR, no JVD Vascular:  Vessel Right Left  Radial Palpable Palpable                                     Musculoskeletal: M/S 5/5 throughout.  No deformity or atrophy.  Roughly quarter size superficial ulceration on the anterior medial right calf area.  The majority of the previous blistering has weathered away and there is thin skin in its place.  1+ right lower extremity edema, 2+ left lower extremity edema. Neurologic: Sensation grossly intact in extremities.  Symmetrical.  Speech is fluent.  Psychiatric: Judgment intact, Mood & affect appropriate for pt's clinical situation. Dermatologic: Right calf ulceration as above       Labs No results found for this or any previous visit (from the past 2160 hour(s)).  Radiology No results found.  Assessment/Plan Diabetes (HCC) blood glucose control important in reducing the progression of atherosclerotic disease. Also, involved in wound healing. On appropriate medications.  Hypertension blood pressure control important in reducing the progression of atherosclerotic disease. On appropriate oral medications.  Lymphedema Swelling under good control with Unna boot but there is still skin breakdown.  We will continue the Unna boot for that reason.  Venous ulcer of right leg (HCC) 3 weeks in an Unna boot has markedly improved the swelling and significantly improved the area of ulceration.  This  is now basically a quarter sized ulceration and the very large blister that was on there previously has weathered away.  It is still painful.  We will refill her tramadol again today.  We will also continue Unna boots for 2-3 more weeks and a 3 layer Unna boot was placed today.    Leotis Pain, MD  11/05/2018 11:43 AM    This note was created with Dragon medical transcription system.  Any errors from dictation are purely unintentional

## 2018-11-05 NOTE — Assessment & Plan Note (Signed)
3 weeks in an Unna boot has markedly improved the swelling and significantly improved the area of ulceration.  This is now basically a quarter sized ulceration and the very large blister that was on there previously has weathered away.  It is still painful.  We will refill her tramadol again today.  We will also continue Unna boots for 2-3 more weeks and a 3 layer Unna boot was placed today.

## 2018-11-12 ENCOUNTER — Ambulatory Visit (INDEPENDENT_AMBULATORY_CARE_PROVIDER_SITE_OTHER): Payer: Medicare Other | Admitting: Vascular Surgery

## 2018-11-12 ENCOUNTER — Other Ambulatory Visit: Payer: Self-pay

## 2018-11-12 ENCOUNTER — Encounter (INDEPENDENT_AMBULATORY_CARE_PROVIDER_SITE_OTHER): Payer: Self-pay

## 2018-11-12 ENCOUNTER — Ambulatory Visit (INDEPENDENT_AMBULATORY_CARE_PROVIDER_SITE_OTHER): Payer: Medicare Other | Admitting: Nurse Practitioner

## 2018-11-12 VITALS — BP 117/74 | HR 84 | Resp 16

## 2018-11-12 DIAGNOSIS — L97919 Non-pressure chronic ulcer of unspecified part of right lower leg with unspecified severity: Secondary | ICD-10-CM

## 2018-11-12 DIAGNOSIS — I83019 Varicose veins of right lower extremity with ulcer of unspecified site: Secondary | ICD-10-CM

## 2018-11-12 NOTE — Progress Notes (Signed)
History of Present Illness  There is no documented history at this time  Assessments & Plan   There are no diagnoses linked to this encounter.    Additional instructions  Subjective:  Patient presents with venous ulcer of the Right lower extremity.    Procedure:  3 layer unna wrap was placed Right lower extremity.   Plan:   Follow up in one week.   

## 2018-11-17 ENCOUNTER — Encounter (INDEPENDENT_AMBULATORY_CARE_PROVIDER_SITE_OTHER): Payer: Self-pay | Admitting: Nurse Practitioner

## 2018-11-19 ENCOUNTER — Ambulatory Visit (INDEPENDENT_AMBULATORY_CARE_PROVIDER_SITE_OTHER): Payer: Medicare Other | Admitting: Nurse Practitioner

## 2018-11-19 ENCOUNTER — Other Ambulatory Visit: Payer: Self-pay

## 2018-11-19 ENCOUNTER — Encounter (INDEPENDENT_AMBULATORY_CARE_PROVIDER_SITE_OTHER): Payer: Self-pay

## 2018-11-19 VITALS — BP 134/83 | HR 86 | Resp 16

## 2018-11-19 DIAGNOSIS — L97919 Non-pressure chronic ulcer of unspecified part of right lower leg with unspecified severity: Secondary | ICD-10-CM | POA: Diagnosis not present

## 2018-11-19 DIAGNOSIS — I83019 Varicose veins of right lower extremity with ulcer of unspecified site: Secondary | ICD-10-CM | POA: Diagnosis not present

## 2018-11-19 NOTE — Progress Notes (Signed)
History of Present Illness  There is no documented history at this time  Assessments & Plan   There are no diagnoses linked to this encounter.    Additional instructions  Subjective:  Patient presents with venous ulcer of the Right lower extremity.    Procedure:  3 layer unna wrap was placed Right lower extremity.   Plan:   Follow up in one week.   

## 2018-11-24 ENCOUNTER — Encounter (INDEPENDENT_AMBULATORY_CARE_PROVIDER_SITE_OTHER): Payer: Self-pay | Admitting: Nurse Practitioner

## 2018-11-26 ENCOUNTER — Other Ambulatory Visit: Payer: Self-pay

## 2018-11-26 ENCOUNTER — Encounter (INDEPENDENT_AMBULATORY_CARE_PROVIDER_SITE_OTHER): Payer: Self-pay | Admitting: Vascular Surgery

## 2018-11-26 ENCOUNTER — Ambulatory Visit (INDEPENDENT_AMBULATORY_CARE_PROVIDER_SITE_OTHER): Payer: Medicare Other | Admitting: Vascular Surgery

## 2018-11-26 VITALS — BP 108/63 | HR 92 | Resp 16

## 2018-11-26 DIAGNOSIS — Z79899 Other long term (current) drug therapy: Secondary | ICD-10-CM

## 2018-11-26 DIAGNOSIS — L97919 Non-pressure chronic ulcer of unspecified part of right lower leg with unspecified severity: Secondary | ICD-10-CM

## 2018-11-26 DIAGNOSIS — I10 Essential (primary) hypertension: Secondary | ICD-10-CM | POA: Diagnosis not present

## 2018-11-26 DIAGNOSIS — I83019 Varicose veins of right lower extremity with ulcer of unspecified site: Secondary | ICD-10-CM

## 2018-11-26 DIAGNOSIS — I890 Lymphedema, not elsewhere classified: Secondary | ICD-10-CM | POA: Diagnosis not present

## 2018-11-26 DIAGNOSIS — E119 Type 2 diabetes mellitus without complications: Secondary | ICD-10-CM

## 2018-11-26 DIAGNOSIS — Z7984 Long term (current) use of oral hypoglycemic drugs: Secondary | ICD-10-CM

## 2018-11-26 NOTE — Progress Notes (Signed)
MRN : [id number]  Debra Butler is a 85 y.o. (1921/12/28) female who presents with chief complaint of No chief complaint on file. Marland Kitchen  History of Present Illness: Patient returns today in follow up of her lymphedema and skin breakdown on the right leg.  Her skin is almost completely healed where the large blister was previously.  She still has some pain and tenderness there and has taken intermittent tramadol with good relief.  We have now been doing Unna boots for several weeks and this has markedly improved the situation.  Current Outpatient Medications  Medication Sig Dispense Refill  . aspirin 81 MG tablet Take 81 mg by mouth daily.    . cephALEXin (KEFLEX) 500 MG capsule Take 1 capsule (500 mg total) by mouth 2 (two) times daily. 20 capsule 0  . cholecalciferol (VITAMIN D) 1000 units tablet Take 1,000 Units by mouth daily.    . cyanocobalamin (,VITAMIN B-12,) 1000 MCG/ML injection Inject 1,000 mcg into the muscle every 30 (thirty) days.    . furosemide (LASIX) 80 MG tablet Take 80 mg by mouth 2 (two) times daily. In the morning & 4pm in the afternoon.    Marland Kitchen glipiZIDE (GLUCOTROL XL) 5 MG 24 hr tablet Take 5 mg by mouth daily.    Marland Kitchen HYDROcodone-acetaminophen (NORCO) 5-325 MG tablet Take 1 tablet by mouth every 6 (six) hours as needed. (Patient not taking: Reported on 10/15/2018) 40 tablet 0  . levothyroxine (SYNTHROID, LEVOTHROID) 112 MCG tablet Take 112 mcg by mouth daily before breakfast.    . metFORMIN (GLUCOPHAGE) 1000 MG tablet Take 1,000 mg by mouth 2 (two) times daily. With Breakfast & with supper    . nitroGLYCERIN (NITROSTAT) 0.4 MG SL tablet Place 0.4 mg under the tongue every 5 (five) minutes as needed for chest pain.    . potassium chloride SA (K-DUR,KLOR-CON) 20 MEQ tablet Take 20 mEq by mouth 2 (two) times daily.    . Pramox-PE-Glycerin-Petrolatum (PREPARATION H) 1-0.25-14.4-15 % CREA Place 1 application rectally 3 (three) times daily as needed (for rectal  discomfort/hemmorroids.).    Marland Kitchen pregabalin (LYRICA) 100 MG capsule Take 100 mg by mouth 2 (two) times daily.    . traMADol (ULTRAM) 50 MG tablet Take by mouth every 6 (six) hours as needed.    . warfarin (COUMADIN) 6 MG tablet Take 6 mg by mouth daily with supper.     No current facility-administered medications for this visit.     Past Medical History:  Diagnosis Date  . Arthritis   . Blood clot in vein 2013   left leg  . Diabetes mellitus without complication ([place]) 3818  . Hypertension 1988  . IBS (irritable bowel syndrome)   . Myocardial infarction [facility])     Past Surgical History:  Procedure Laterality Date  . ABDOMINAL HYSTERECTOMY  age 89   partial, still has ovaries and tubes  . APPENDECTOMY    . BACK SURGERY     screws and titanium  . BREAST BIOPSY     at age 53, benign  . CARPAL TUNNEL RELEASE Right 2010  . CARPAL TUNNEL RELEASE Left 07/25/2017   Procedure: CARPAL TUNNEL RELEASE;  Surgeon: Earnestine Leys, MD;  Location: [facility] ORS;  Service: Orthopedics;  Laterality: Left;  . CHOLECYSTECTOMY    . COLON SURGERY  1984  . COLONOSCOPY  2007, 2016   Dr Jamal Collin  . FOOT SURGERY    . FRACTURE SURGERY  2010   hip  . FRACTURE SURGERY  2009  femur  . JOINT REPLACEMENT     both knee  . PACEMAKER PLACEMENT  2013  . TONSILLECTOMY    . UPPER GI ENDOSCOPY  2016   Dr Jamal Collin    Social History  Substance Use Topics  . Smoking status: Never Smoker  . Smokeless tobacco: Never Used  . Alcohol use No    Family History No bleeding or clotting disorders       Allergies  Allergen Reactions  . Phenergan [Promethazine] Other (See Comments)    Altered mental status  . Sulphur [Sulfur]      REVIEW OF SYSTEMS(Negative unless checked)  Constitutional: [] ????Weight loss[] ????Fever[] ????Chills Cardiac:[] ????Chest pain[] ????Chest pressure[x] ????Palpitations [] ????Shortness of breath when laying flat [] ????Shortness of breath at rest  [] ????Shortness of breath with exertion. Vascular: [] ????Pain in legs with walking[] ????Pain in legsat rest[] ????Pain in legs when laying flat [] ????Claudication [] ????Pain in feet when walking [] ????Pain in feet at rest [] ????Pain in feet when laying flat [x] ????History of DVT [] ????Phlebitis [x] ????Swelling in legs [] ????Varicose veins [x] ????Non-healing ulcers Pulmonary: [] ????Uses home oxygen [] ????Productive cough[] ????Hemoptysis [] ????Wheeze [] ????COPD [] ????Asthma Neurologic: [] ????Dizziness [] ????Blackouts [] ????Seizures [] ????History of stroke [] ????History of TIA[] ????Aphasia [] ????Temporary blindness[] ????Dysphagia [] ????Weaknessor numbness in arms [] ????Weakness or numbnessin legs Musculoskeletal: [x] ????Arthritis [] ????Joint swelling [] ????Joint pain [] ????Low back pain Hematologic:[] ????Easy bruising[] ????Easy bleeding [] ????Hypercoagulable state [] ????Anemic  Gastrointestinal:[] ????Blood in stool[] ????Vomiting blood[] ????Gastroesophageal reflux/heartburn[] ????Abdominal pain Genitourinary: [] ????Chronic kidney disease [] ????Difficulturination [] ????Frequenturination [] ????Burning with urination[] ????Hematuria Skin: [] ????Rashes [x] ????Ulcers [x] ????Wounds Psychological: [] ????History of anxiety[] ????History of major depression.    Physical Examination  There were no vitals taken for this visit. Gen:  WD/WN, NAD.  Appears much younger than stated age Head: [place]/AT, No temporalis wasting. Ear/Nose/Throat: Hearing grossly intact, nares w/o erythema or drainage Eyes: Conjunctiva clear. Sclera non-icteric Neck: Supple.  Trachea midline Pulmonary:  Good air movement, no use of accessory muscles.  Cardiac: Irregular Vascular:  Vessel Right Left  Radial Palpable Palpable                          PT Palpable Palpable  DP Palpable Palpable   Musculoskeletal: M/S 5/5  throughout.  No deformity or atrophy.  Trace to 1+ bilateral lower extremity edema. Neurologic: Sensation grossly intact in extremities.  Symmetrical.  Speech is fluent.  Psychiatric: Judgment intact, Mood & affect appropriate for pt's clinical situation. Dermatologic: The area of the previous blister has almost completely epithelialized at this point.       Labs No results found for this or any previous visit (from the past 2160 hour(s)).  Radiology No results found.  Assessment/Plan Diabetes (HCC) blood glucose control important in reducing the progression of atherosclerotic disease. Also, involved in wound healing. On appropriate medications.  Hypertension blood pressure control important in reducing the progression of atherosclerotic disease. On appropriate oral medications.  Lymphedema Swelling under good control with Unna boot but there is still skin breakdown although it is minimal at this point.  We will continue the Unna boot for that reason.  Hope to come out of the [employer] next week.  No problem-specific Assessment & Plan notes found for this encounter.    Leotis Pain, MD  11/26/2018 11:21 AM    This note was created with Dragon medical transcription system.  Any errors from dictation are purely unintentional

## 2018-11-26 NOTE — Assessment & Plan Note (Signed)
Skin almost completely healed at this point.  1 more week of Unna boots and hope to transition out of Unna boots and then recheck her legs in 2 to 3 weeks.

## 2018-11-29 ENCOUNTER — Other Ambulatory Visit (INDEPENDENT_AMBULATORY_CARE_PROVIDER_SITE_OTHER): Payer: Self-pay | Admitting: Vascular Surgery

## 2018-12-03 ENCOUNTER — Other Ambulatory Visit: Payer: Self-pay

## 2018-12-03 ENCOUNTER — Encounter (INDEPENDENT_AMBULATORY_CARE_PROVIDER_SITE_OTHER): Payer: Self-pay

## 2018-12-03 ENCOUNTER — Other Ambulatory Visit (INDEPENDENT_AMBULATORY_CARE_PROVIDER_SITE_OTHER): Payer: Self-pay | Admitting: Vascular Surgery

## 2018-12-03 ENCOUNTER — Ambulatory Visit (INDEPENDENT_AMBULATORY_CARE_PROVIDER_SITE_OTHER): Payer: Medicare Other | Admitting: Nurse Practitioner

## 2018-12-03 VITALS — BP 125/77 | HR 82 | Resp 16

## 2018-12-03 DIAGNOSIS — I83019 Varicose veins of right lower extremity with ulcer of unspecified site: Secondary | ICD-10-CM

## 2018-12-03 DIAGNOSIS — L97919 Non-pressure chronic ulcer of unspecified part of right lower leg with unspecified severity: Secondary | ICD-10-CM

## 2018-12-03 NOTE — Progress Notes (Signed)
History of Present Illness  There is no documented history at this time  Assessments & Plan   There are no diagnoses linked to this encounter.    Additional instructions  Subjective:  Patient presents with venous ulcer of the Right lower extremity.    Procedure:  3 layer unna wrap was placed Right lower extremity.   Plan:   Follow up in one week.   

## 2018-12-05 ENCOUNTER — Ambulatory Visit (INDEPENDENT_AMBULATORY_CARE_PROVIDER_SITE_OTHER): Payer: Medicare Other | Admitting: Nurse Practitioner

## 2018-12-05 ENCOUNTER — Telehealth (INDEPENDENT_AMBULATORY_CARE_PROVIDER_SITE_OTHER): Payer: Self-pay

## 2018-12-05 ENCOUNTER — Other Ambulatory Visit: Payer: Self-pay

## 2018-12-05 VITALS — BP 147/84 | HR 94 | Resp 16

## 2018-12-05 DIAGNOSIS — I83029 Varicose veins of left lower extremity with ulcer of unspecified site: Secondary | ICD-10-CM

## 2018-12-05 DIAGNOSIS — L97929 Non-pressure chronic ulcer of unspecified part of left lower leg with unspecified severity: Secondary | ICD-10-CM

## 2018-12-05 NOTE — Telephone Encounter (Addendum)
Ms Debra Butler left a voicemail stating that Ms Debra Butler has a blister on her left leg and was trying to be seen today.I spoke with Eulogio Ditch NP and she recommended for the patient to come in for a unna wrap.I inform Ms Debra Butler with information and Ms Debra Butler is schedule to come in today

## 2018-12-05 NOTE — Progress Notes (Signed)
History of Present Illness  There is no documented history at this time  Assessments & Plan   There are no diagnoses linked to this encounter.    Additional instructions  Subjective:  Patient presents with venous ulcer of the Left lower extremity.    Procedure:  3 layer unna wrap was placed Left lower extremity.   Plan:   Follow up in one week.  

## 2018-12-07 ENCOUNTER — Other Ambulatory Visit: Payer: Self-pay

## 2018-12-07 ENCOUNTER — Emergency Department
Admission: EM | Admit: 2018-12-07 | Discharge: 2018-12-08 | Disposition: A | Discharge status: Home / Self Care | Payer: Medicare Other | Attending: Emergency Medicine | Admitting: Emergency Medicine

## 2018-12-07 ENCOUNTER — Encounter: Payer: Self-pay | Admitting: Emergency Medicine

## 2018-12-07 ENCOUNTER — Emergency Department: Payer: Medicare Other

## 2018-12-07 DIAGNOSIS — G8929 Other chronic pain: Secondary | ICD-10-CM

## 2018-12-07 DIAGNOSIS — E119 Type 2 diabetes mellitus without complications: Secondary | ICD-10-CM | POA: Diagnosis not present

## 2018-12-07 DIAGNOSIS — I252 Old myocardial infarction: Secondary | ICD-10-CM | POA: Insufficient documentation

## 2018-12-07 DIAGNOSIS — R079 Chest pain, unspecified: Secondary | ICD-10-CM | POA: Diagnosis not present

## 2018-12-07 DIAGNOSIS — I10 Essential (primary) hypertension: Secondary | ICD-10-CM | POA: Diagnosis not present

## 2018-12-07 DIAGNOSIS — Z9049 Acquired absence of other specified parts of digestive tract: Secondary | ICD-10-CM | POA: Insufficient documentation

## 2018-12-07 DIAGNOSIS — Z950 Presence of cardiac pacemaker: Secondary | ICD-10-CM | POA: Insufficient documentation

## 2018-12-07 DIAGNOSIS — M545 Low back pain: Secondary | ICD-10-CM | POA: Insufficient documentation

## 2018-12-07 DIAGNOSIS — W010XXA Fall on same level from slipping, tripping and stumbling without subsequent striking against object, initial encounter: Secondary | ICD-10-CM | POA: Insufficient documentation

## 2018-12-07 DIAGNOSIS — Z03818 Encounter for observation for suspected exposure to other biological agents ruled out: Secondary | ICD-10-CM | POA: Insufficient documentation

## 2018-12-07 DIAGNOSIS — Z96653 Presence of artificial knee joint, bilateral: Secondary | ICD-10-CM | POA: Diagnosis not present

## 2018-12-07 LAB — COMPREHENSIVE METABOLIC PANEL
ALT: 20 U/L (ref 0–44)
AST: 24 U/L (ref 15–41)
Albumin: 4.6 g/dL (ref 3.5–5.0)
Alkaline Phosphatase: 96 U/L (ref 38–126)
Anion gap: 16 — ABNORMAL HIGH (ref 5–15)
BUN: 36 mg/dL — ABNORMAL HIGH (ref 8–23)
CO2: 29 mmol/L (ref 22–32)
Calcium: 9.2 mg/dL (ref 8.9–10.3)
Chloride: 92 mmol/L — ABNORMAL LOW (ref 98–111)
Creatinine, Ser: 1.66 mg/dL — ABNORMAL HIGH (ref 0.44–1.00)
GFR calc Af Amer: 30 mL/min — ABNORMAL LOW (ref >60)
GFR calc non Af Amer: 26 mL/min — ABNORMAL LOW (ref >60)
Glucose, Bld: 336 mg/dL — ABNORMAL HIGH (ref 70–99)
Potassium: 4.3 mmol/L (ref 3.5–5.1)
Sodium: 137 mmol/L (ref 135–145)
Total Bilirubin: 0.7 mg/dL (ref 0.3–1.2)
Total Protein: 7.6 g/dL (ref 6.5–8.1)

## 2018-12-07 LAB — CBC
HCT: 37.9 % (ref 36.0–46.0)
Hemoglobin: 12.4 g/dL (ref 12.0–15.0)
MCH: 35.5 pg — ABNORMAL HIGH (ref 26.0–34.0)
MCHC: 32.7 g/dL (ref 30.0–36.0)
MCV: 108.6 fL — ABNORMAL HIGH (ref 80.0–100.0)
Platelets: 184 10*3/uL (ref 150–400)
RBC: 3.49 MIL/uL — ABNORMAL LOW (ref 3.87–5.11)
RDW: 15.9 % — ABNORMAL HIGH (ref 11.5–15.5)
WBC: 12.4 10*3/uL — ABNORMAL HIGH (ref 4.0–10.5)
nRBC: 0.3 % — ABNORMAL HIGH (ref 0.0–0.2)

## 2018-12-07 LAB — SARS CORONAVIRUS 2 BY RT PCR (HOSPITAL ORDER, PERFORMED IN ~~LOC~~ HOSPITAL LAB): SARS Coronavirus 2: NEGATIVE

## 2018-12-07 LAB — TROPONIN I (HIGH SENSITIVITY): Troponin I (High Sensitivity): 15 ng/L (ref <18)

## 2018-12-07 MED ORDER — SODIUM CHLORIDE 0.9 % IV BOLUS
500.0000 mL | Freq: Once | INTRAVENOUS | Status: AC
  Administered 2018-12-07: 500 mL via INTRAVENOUS

## 2018-12-07 NOTE — ED Notes (Addendum)
ED Provider at bedside.  Janora Norlander, [place], on phone with pt, [phone]

## 2018-12-07 NOTE — ED Notes (Signed)
ED Provider at bedside. 

## 2018-12-07 NOTE — ED Notes (Signed)
Pt fluids still running; arm straightened

## 2018-12-07 NOTE — ED Provider Notes (Signed)
[facility] Emergency Department Provider Note       Time seen: ----------------------------------------- 7:57 PM on 12/07/2018 -----------------------------------------   I have reviewed the triage vital signs and the nursing notes.  HISTORY   Chief Complaint Back Pain    HPI Debra Butler is a 85 y.o. female with a history of arthritis, DVT, diabetes, hypertension, IBS, MI who presents to the ED for low back pain.  Patient states it feels like a spasm.  She was brought in by EMS from home.  She fell several weeks ago and had a resulting negative x-ray taken as an outpatient.  Back pain became worse today.  She denies fevers, chills or other complaints.  Past Medical History:  Diagnosis Date  . Arthritis   . Blood clot in vein 2013   left leg  . Diabetes mellitus without complication ([employer]) 6203  . Hypertension 1988  . IBS (irritable bowel syndrome)   . Myocardial infarction [facility])     Patient Active Problem List   Diagnosis Date Noted  . Venous ulcer of left leg ([place]) 07/09/2018  . Venous ulcer of right leg ([place]) 06/10/2018  . Lower extremity pain, bilateral 11/20/2017  . Blister 10/31/2017  . Chest pain 09/06/2016  . Lower extremity edema 07/31/2016  . Diabetes ([place]) 04/03/2016  . Lymphedema 04/03/2016  . Swelling of limb 04/03/2016  . Hypertension 05/29/1986    Past Surgical History:  Procedure Laterality Date  . ABDOMINAL HYSTERECTOMY  age 68   partial, still has ovaries and tubes  . APPENDECTOMY    . BACK SURGERY     screws and titanium  . BREAST BIOPSY     at age 68, benign  . CARPAL TUNNEL RELEASE Right 2010  . CARPAL TUNNEL RELEASE Left 07/25/2017   Procedure: CARPAL TUNNEL RELEASE;  Surgeon: Earnestine Leys, MD;  Location: [facility] ORS;  Service: Orthopedics;  Laterality: Left;  . CHOLECYSTECTOMY    . COLON SURGERY  1984  . COLONOSCOPY  2007, 2016   Dr Jamal Collin  . FOOT SURGERY    . FRACTURE SURGERY  2010   hip  . FRACTURE  SURGERY  2009   femur  . JOINT REPLACEMENT     both knee  . PACEMAKER PLACEMENT  2013  . TONSILLECTOMY    . UPPER GI ENDOSCOPY  2016   Dr Jamal Collin    Allergies Sulphur [sulfur] and Phenergan [promethazine]  Social History Social History   Tobacco Use  . Smoking status: Never Smoker  . Smokeless tobacco: Never Used  Substance Use Topics  . Alcohol use: No  . Drug use: No    Review of Systems Constitutional: Negative for fever. Cardiovascular: Positive for chest pain Respiratory: Negative for shortness of breath. Gastrointestinal: Negative for abdominal pain, vomiting and diarrhea. Musculoskeletal: Positive for back pain Skin: Negative for rash. Neurological: Negative for headaches, focal weakness or numbness.  All systems negative/normal/unremarkable except as stated in the HPI  ____________________________________________   PHYSICAL EXAM:  VITAL SIGNS: ED Triage Vitals  Enc Vitals Group     BP 12/07/18 1950 (!) 179/93     Pulse Rate 12/07/18 1950 (!) 111     Resp 12/07/18 1950 20     Temp 12/07/18 1950 99.9 F (37.7 C)     Temp Source 12/07/18 1950 Oral     SpO2 12/07/18 1950 95 %     Weight 12/07/18 1952 201 lb (91.2 kg)     Height 12/07/18 1952 5' (1.524 m)  Head Circumference --      Peak Flow --      Pain Score 12/07/18 1951 5     Pain Loc --      Pain Edu? --      Excl. in [place]? --    Constitutional: Alert and oriented.  No acute distress Eyes: Conjunctivae are normal. Normal extraocular movements. ENT      Head: Normocephalic and atraumatic.      Nose: No congestion/rhinnorhea.      Mouth/Throat: Mucous membranes are moist.      Neck: No stridor. Cardiovascular: Normal rate, regular rhythm. No murmurs, rubs, or gallops. Respiratory: Normal respiratory effort without tachypnea nor retractions. Breath sounds are clear and equal bilaterally. No wheezes/rales/rhonchi. Gastrointestinal: Soft and nontender. Normal bowel sounds Musculoskeletal:  Nontender with normal range of motion in extremities. No lower extremity tenderness nor edema. Neurologic:  Normal speech and language. No gross focal neurologic deficits are appreciated.  Skin:  Skin is warm, dry and intact. No rash noted. Psychiatric: Mood and affect are normal. Speech and behavior are normal.  ____________________________________________  EKG: Interpreted by me.  Atrial fibrillation with a rate of 104 bpm, low voltage, nonspecific ST changes, normal axis  ____________________________________________  ED COURSE:  As part of my medical decision making, I reviewed the following data within the [place] History obtained from family if available, nursing notes, old chart and ekg, as well as notes from prior ED visits. Patient presented for back pain, we will assess with labs and imaging as indicated at this time. Clinical Course as of Dec 07 2155  Sat Dec 07, 2018  2131 Patient does not have any right arm pain.   [JW]    Clinical Course User Index [JW] Earleen Newport, MD   Procedures  Debra Butler was evaluated in Emergency Department on 12/07/2018 for the symptoms described in the history of present illness. She was evaluated in the context of the global COVID-19 pandemic, which necessitated consideration that the patient might be at risk for infection with the SARS-CoV-2 virus that causes COVID-19. Institutional protocols and algorithms that pertain to the evaluation of patients at risk for COVID-19 are in a state of rapid change based on information released by regulatory bodies including the CDC and federal and state organizations. These policies and algorithms were followed during the patient's care in the ED.  ____________________________________________   LABS (pertinent positives/negatives)  Labs Reviewed  CBC - Abnormal; Notable for the following components:      Result Value   WBC 12.4 (*)    RBC 3.49 (*)    MCV 108.6 (*)    MCH  35.5 (*)    RDW 15.9 (*)    nRBC 0.3 (*)    All other components within normal limits  COMPREHENSIVE METABOLIC PANEL - Abnormal; Notable for the following components:   Chloride 92 (*)    Glucose, Bld 336 (*)    BUN 36 (*)    Creatinine, Ser 1.66 (*)    GFR calc non Af Amer 26 (*)    GFR calc Af Amer 30 (*)    Anion gap 16 (*)    All other components within normal limits  SARS CORONAVIRUS 2 (HOSPITAL ORDER, [facility])  TROPONIN I (HIGH SENSITIVITY)    RADIOLOGY Images were viewed by me  Lumbar spine x-ray, chest x-ray IMPRESSION: 1. Cardiomegaly without acute airspace disease, pleural effusion or pneumothorax 2. Possible step-off/fracture deformity of  the proximal right humerus. Recommended dedicated right shoulder radiographs IMPRESSION: Chronic changes without acute abnormality. ____________________________________________   DIFFERENTIAL DIAGNOSIS   Chronic pain, spasm, fracture, dehydration, electrolyte abnormality, sepsis  FINAL ASSESSMENT AND PLAN  Low back pain   Plan: The patient had presented for low back pain. Patient's labs did reveal slightly elevated BUN and creatinine compared to prior for which she was given IV fluids. Patient's imaging did not reveal any acute process.  She has no right arm tenderness which was a concern on her chest x-ray.  She is cleared for outpatient follow-up.   Laurence Aly, MD    Note: This note was generated in part or whole with voice recognition software. Voice recognition is usually quite accurate but there are transcription errors that can and very often do occur. I apologize for any typographical errors that were not detected and corrected.     Earleen Newport, MD 12/07/18 2158

## 2018-12-07 NOTE — ED Triage Notes (Addendum)
Patient brought in by ems from home. Patient reports that she has chronic back pain. Patient states that she fell a couple of weeks ago. Patient states that her back pain became worse today. Patient states that it feels like muscle spasms. Patient with skin tear left wrist.

## 2018-12-08 ENCOUNTER — Encounter (INDEPENDENT_AMBULATORY_CARE_PROVIDER_SITE_OTHER): Payer: Self-pay | Admitting: Nurse Practitioner

## 2018-12-08 NOTE — ED Notes (Addendum)
Call from [place], caregiver, at [place],  pt's housecoat is at pt's house

## 2018-12-08 NOTE — ED Notes (Signed)
Right ear hearing aid came out and was found in the bedding and given to pt and ensured that was in pt's right ear at discharged.  Pt cleaned, new diaper applied, pt wheeled to car of Fidelis, caregiver, and transferred.  Pt reports missing housecoat, CT, XRAY and Selinda Eon, RN and Hustler, EDT contacted for whereabouts. All report unknown except Kadieja, who reports pt had only gown on when EKG was performed.  Pt given this RNs number to call when home to look for housecoat.  Peripheral IV discontinued. Catheter intact. No signs of infiltration or redness. Gauze applied to IV site.   Discharge instructions reviewed with patient. Questions fielded by this RN. Patient verbalizes understanding of instructions. Patient discharged home in stable condition per williams. No acute distress noted at time of discharge.

## 2018-12-09 ENCOUNTER — Encounter (INDEPENDENT_AMBULATORY_CARE_PROVIDER_SITE_OTHER): Payer: Self-pay | Admitting: Nurse Practitioner

## 2018-12-10 ENCOUNTER — Encounter (INDEPENDENT_AMBULATORY_CARE_PROVIDER_SITE_OTHER): Payer: Self-pay

## 2018-12-10 ENCOUNTER — Ambulatory Visit (INDEPENDENT_AMBULATORY_CARE_PROVIDER_SITE_OTHER): Payer: Medicare Other | Admitting: Nurse Practitioner

## 2018-12-10 ENCOUNTER — Other Ambulatory Visit: Payer: Self-pay

## 2018-12-10 VITALS — BP 140/85 | HR 59 | Resp 16

## 2018-12-10 DIAGNOSIS — I83019 Varicose veins of right lower extremity with ulcer of unspecified site: Secondary | ICD-10-CM

## 2018-12-10 DIAGNOSIS — L03115 Cellulitis of right lower limb: Secondary | ICD-10-CM

## 2018-12-10 DIAGNOSIS — I83029 Varicose veins of left lower extremity with ulcer of unspecified site: Secondary | ICD-10-CM | POA: Diagnosis not present

## 2018-12-10 DIAGNOSIS — L97919 Non-pressure chronic ulcer of unspecified part of right lower leg with unspecified severity: Secondary | ICD-10-CM

## 2018-12-10 DIAGNOSIS — L97929 Non-pressure chronic ulcer of unspecified part of left lower leg with unspecified severity: Secondary | ICD-10-CM

## 2018-12-10 MED ORDER — CEPHALEXIN 500 MG PO CAPS: 500.0000 mg | ORAL_CAPSULE | Freq: Two times a day (BID) | ORAL | 0 refills | Status: DC

## 2018-12-10 NOTE — Progress Notes (Signed)
History of Present Illness  There is no documented history at this time  Assessments & Plan   1. Cellulitis of right lower extremity The patient has a blister and the large ulceration on left lower extremity.  Patient will follow-up in 1 week for evaluation. - cephALEXin (KEFLEX) 500 MG capsule; Take 1 capsule (500 mg total) by mouth 2 (two) times daily.  Dispense: 20 capsule; Refill: 0     Additional instructions  Subjective:  Patient presents with venous ulcer of the Left lower extremity.    Procedure:  3 layer unna wrap was placed Left lower extremity.   Plan:   Follow up in one week.

## 2018-12-12 ENCOUNTER — Ambulatory Visit (INDEPENDENT_AMBULATORY_CARE_PROVIDER_SITE_OTHER): Payer: Medicare Other | Admitting: Nurse Practitioner

## 2018-12-12 ENCOUNTER — Other Ambulatory Visit (INDEPENDENT_AMBULATORY_CARE_PROVIDER_SITE_OTHER): Payer: Self-pay | Admitting: Nurse Practitioner

## 2018-12-12 ENCOUNTER — Other Ambulatory Visit: Payer: Self-pay

## 2018-12-12 ENCOUNTER — Encounter (INDEPENDENT_AMBULATORY_CARE_PROVIDER_SITE_OTHER): Payer: Self-pay

## 2018-12-12 VITALS — BP 147/62 | HR 98 | Resp 16

## 2018-12-12 DIAGNOSIS — L97919 Non-pressure chronic ulcer of unspecified part of right lower leg with unspecified severity: Secondary | ICD-10-CM

## 2018-12-12 DIAGNOSIS — I83029 Varicose veins of left lower extremity with ulcer of unspecified site: Secondary | ICD-10-CM | POA: Diagnosis not present

## 2018-12-12 DIAGNOSIS — I83019 Varicose veins of right lower extremity with ulcer of unspecified site: Secondary | ICD-10-CM | POA: Diagnosis not present

## 2018-12-12 DIAGNOSIS — L97929 Non-pressure chronic ulcer of unspecified part of left lower leg with unspecified severity: Secondary | ICD-10-CM | POA: Diagnosis not present

## 2018-12-12 NOTE — Progress Notes (Signed)
History of Present Illness  There is no documented history at this time  Assessments & Plan   There are no diagnoses linked to this encounter.    Additional instructions  Subjective:  Patient presents with venous ulcer of the Bilateral lower extremity.    Procedure:  3 layer unna wrap was placed Bilateral lower extremity.   Plan:   Follow up in one week.  

## 2018-12-15 ENCOUNTER — Encounter (INDEPENDENT_AMBULATORY_CARE_PROVIDER_SITE_OTHER): Payer: Self-pay | Admitting: Nurse Practitioner

## 2018-12-17 ENCOUNTER — Ambulatory Visit (INDEPENDENT_AMBULATORY_CARE_PROVIDER_SITE_OTHER): Payer: Medicare Other | Admitting: Vascular Surgery

## 2018-12-17 ENCOUNTER — Other Ambulatory Visit: Payer: Self-pay

## 2018-12-17 ENCOUNTER — Encounter (INDEPENDENT_AMBULATORY_CARE_PROVIDER_SITE_OTHER): Payer: Self-pay | Admitting: Vascular Surgery

## 2018-12-17 VITALS — BP 129/85 | HR 94 | Resp 16

## 2018-12-17 DIAGNOSIS — L97929 Non-pressure chronic ulcer of unspecified part of left lower leg with unspecified severity: Secondary | ICD-10-CM | POA: Diagnosis not present

## 2018-12-17 DIAGNOSIS — L97919 Non-pressure chronic ulcer of unspecified part of right lower leg with unspecified severity: Secondary | ICD-10-CM

## 2018-12-17 DIAGNOSIS — I83029 Varicose veins of left lower extremity with ulcer of unspecified site: Secondary | ICD-10-CM

## 2018-12-17 DIAGNOSIS — I890 Lymphedema, not elsewhere classified: Secondary | ICD-10-CM

## 2018-12-17 DIAGNOSIS — I83019 Varicose veins of right lower extremity with ulcer of unspecified site: Secondary | ICD-10-CM

## 2018-12-17 DIAGNOSIS — Z79899 Other long term (current) drug therapy: Secondary | ICD-10-CM

## 2018-12-17 DIAGNOSIS — E119 Type 2 diabetes mellitus without complications: Secondary | ICD-10-CM

## 2018-12-17 DIAGNOSIS — Z7984 Long term (current) use of oral hypoglycemic drugs: Secondary | ICD-10-CM

## 2018-12-17 DIAGNOSIS — I10 Essential (primary) hypertension: Secondary | ICD-10-CM

## 2018-12-17 NOTE — Progress Notes (Signed)
MRN : [id number]  Debra Butler is a 85 y.o. (04-22-1922) female who presents with chief complaint of  Chief Complaint  Patient presents with   Follow-up    unna check  .  History of Present Illness: Patient returns today in follow up of her leg swelling and ulceration.  Her left leg is now worse than her right leg.  Both legs have open ulcerations.  Her swelling seems to be more pronounced.  She is also having more bruising.  She went to the hospital about 2 weeks ago for a near syncopal episode.  Current Outpatient Medications  Medication Sig Dispense Refill   aspirin 81 MG tablet Take 81 mg by mouth daily.     cephALEXin (KEFLEX) 500 MG capsule Take 1 capsule (500 mg total) by mouth 2 (two) times daily. 20 capsule 0   cholecalciferol (VITAMIN D) 1000 units tablet Take 1,000 Units by mouth daily.     cyanocobalamin (,VITAMIN B-12,) 1000 MCG/ML injection Inject 1,000 mcg into the muscle every 30 (thirty) days.     furosemide (LASIX) 80 MG tablet Take 80 mg by mouth 2 (two) times daily. In the morning & 4pm in the afternoon.     glipiZIDE (GLUCOTROL XL) 5 MG 24 hr tablet Take 5 mg by mouth daily.     levothyroxine (SYNTHROID, LEVOTHROID) 112 MCG tablet Take 112 mcg by mouth daily before breakfast.     metFORMIN (GLUCOPHAGE) 1000 MG tablet Take 1,000 mg by mouth 2 (two) times daily. With Breakfast & with supper     nitroGLYCERIN (NITROSTAT) 0.4 MG SL tablet Place 0.4 mg under the tongue every 5 (five) minutes as needed for chest pain.     potassium chloride SA (K-DUR,KLOR-CON) 20 MEQ tablet Take 20 mEq by mouth 2 (two) times daily.     Pramox-PE-Glycerin-Petrolatum (PREPARATION H) 1-0.25-14.4-15 % CREA Place 1 application rectally 3 (three) times daily as needed (for rectal discomfort/hemmorroids.).     pregabalin (LYRICA) 100 MG capsule Take 100 mg by mouth 2 (two) times daily.     traMADol (ULTRAM) 50 MG tablet TAKE 1 TABLET BY MOUTH EVERY 6 HOURS AS NEEDED 30 tablet 0     warfarin (COUMADIN) 6 MG tablet Take 6 mg by mouth daily with supper.     HYDROcodone-acetaminophen (NORCO) 5-325 MG tablet Take 1 tablet by mouth every 6 (six) hours as needed. (Patient not taking: Reported on 10/15/2018) 40 tablet 0   No current facility-administered medications for this visit.     Past Medical History:  Diagnosis Date   Arthritis    Blood clot in vein 2013   left leg   Diabetes mellitus without complication ([place]) 6203   Hypertension 1988   IBS (irritable bowel syndrome)    Myocardial infarction [facility])     Past Surgical History:  Procedure Laterality Date   ABDOMINAL HYSTERECTOMY  age 89   partial, still has ovaries and tubes   APPENDECTOMY     BACK SURGERY     screws and titanium   BREAST BIOPSY     at age 27, benign   CARPAL TUNNEL RELEASE Right 2010   CARPAL TUNNEL RELEASE Left 07/25/2017   Procedure: CARPAL TUNNEL RELEASE;  Surgeon: Earnestine Leys, MD;  Location: [facility] ORS;  Service: Orthopedics;  Laterality: Left;   [place]   COLONOSCOPY  2007, 2016   Dr Jamal Collin   FOOT SURGERY     FRACTURE SURGERY  2010  hip   FRACTURE SURGERY  2009   [place]   JOINT REPLACEMENT     both knee   PACEMAKER PLACEMENT  2013   TONSILLECTOMY     UPPER GI ENDOSCOPY  2016   Dr Jamal Collin   Social History  Substance Use Topics   Smoking status: Never Smoker   Smokeless tobacco: Never Used   Alcohol use No    Family History No bleeding or clotting disorders       Allergies  Allergen Reactions   Phenergan [Promethazine] Other (See Comments)    Altered mental status   Sulphur [Sulfur]      REVIEW OF SYSTEMS(Negative unless checked)  Constitutional: [] ?????Weight loss[] ?????Fever[] ?????Chills Cardiac:[] ?????Chest pain[] ?????Chest pressure[x] ?????Palpitations [] ?????Shortness of breath when laying flat [] ?????Shortness of breath at rest [] ?????Shortness of breath with  exertion. Vascular: [] ?????Pain in legs with walking[] ?????Pain in legsat rest[] ?????Pain in legs when laying flat [] ?????Claudication [] ?????Pain in feet when walking [] ?????Pain in feet at rest [] ?????Pain in feet when laying flat [x] ?????History of DVT [] ?????Phlebitis [x] ?????Swelling in legs [] ?????Varicose veins [x] ?????Non-healing ulcers Pulmonary: [] ?????Uses home oxygen [] ?????Productive cough[] ?????Hemoptysis [] ?????Wheeze [] ?????COPD [] ?????Asthma Neurologic: [] ?????Dizziness [] ?????Blackouts [] ?????Seizures [] ?????History of stroke [] ?????History of TIA[] ?????Aphasia [] ?????Temporary blindness[] ?????Dysphagia [] ?????Weaknessor numbness in arms [] ?????Weakness or numbnessin legs Musculoskeletal: [x] ?????Arthritis [] ?????Joint swelling [] ?????Joint pain [] ?????Low back pain Hematologic:[] ?????Easy bruising[] ?????Easy bleeding [] ?????Hypercoagulable state [] ?????Anemic  Gastrointestinal:[] ?????Blood in stool[] ?????Vomiting blood[] ?????Gastroesophageal reflux/heartburn[] ?????Abdominal pain Genitourinary: [] ?????Chronic kidney disease [] ?????Difficulturination [] ?????Frequenturination [] ?????Burning with urination[] ?????Hematuria Skin: [] ?????Rashes [x] ?????Ulcers [x] ?????Wounds Psychological: [] ?????History of anxiety[] ?????History of major depression.    Physical Examination  BP 129/85 (BP Location: Right Arm)    Pulse 94    Resp 16  Gen:  WD/WN, NAD.  Much younger than stated age. Head: [place]/AT, No temporalis wasting. Ear/Nose/Throat: Hearing grossly intact, nares w/o erythema or drainage Eyes: Conjunctiva clear. Sclera non-icteric Neck: Supple.  Trachea midline Pulmonary:  Good air movement, no use of accessory muscles.  Cardiac: irregular Vascular:  Vessel Right Left  Radial Palpable Palpable                       Musculoskeletal: M/S 5/5 throughout.  No deformity or  atrophy.  1-2+ bilateral lower extremity edema.  Moderate sized ulcerations on both lower extremities.  Severe stasis dermatitis changes present bilaterally Neurologic: Sensation grossly intact in extremities.  Symmetrical.  Speech is fluent.  Psychiatric: Judgment intact, Mood & affect appropriate for pt's clinical situation. Dermatologic: Open ulcerations present to both lower extremities.       Labs Recent Results (from the past 2160 hour(s))  CBC     Status: Abnormal   Collection Time: 12/07/18  8:22 PM  Result Value Ref Range   WBC 12.4 (H) 4.0 - 10.5 K/uL   RBC 3.49 (L) 3.87 - 5.11 MIL/uL   Hemoglobin 12.4 12.0 - 15.0 g/dL   HCT 37.9 36.0 - 46.0 %   MCV 108.6 (H) 80.0 - 100.0 fL   MCH 35.5 (H) 26.0 - 34.0 pg   MCHC 32.7 30.0 - 36.0 g/dL   RDW 15.9 (H) 11.5 - 15.5 %   Platelets 184 150 - 400 K/uL   nRBC 0.3 (H) 0.0 - 0.2 %    Comment: Performed at [facility], [address] [place], [place] [address]  Troponin I (High Sensitivity)     Status: None   Collection Time: 12/07/18  8:22 PM  Result Value Ref Range   Troponin I (High Sensitivity) 15 <18 ng/L    Comment: (NOTE) Elevated high sensitivity troponin I (hsTnI) values and significant  changes across serial measurements may suggest ACS but many other  chronic and acute conditions are known to elevate hsTnI results.  Refer to the "Links" section for chest pain algorithms and additional  guidance. Performed at [facility], [address] [place], [place] [address]   Comprehensive metabolic panel     Status: Abnormal   Collection Time: 12/07/18  8:22 PM  Result Value Ref Range   Sodium 137 135 - 145 mmol/L   Potassium 4.3 3.5 - 5.1 mmol/L   Chloride 92 (L) 98 - 111 mmol/L   CO2 29 22 - 32 mmol/L   Glucose, Bld 336 (H) 70 - 99 mg/dL   BUN 36 (H) 8 - 23 mg/dL   Creatinine, Ser 1.66 (H) 0.44 - 1.00 mg/dL   Calcium 9.2 8.9 - 10.3 mg/dL   Total Protein 7.6 6.5 - 8.1 g/dL   Albumin 4.6 3.5 -  5.0 g/dL   AST 24 15 - 41 U/L   ALT 20 0 - 44 U/L   Alkaline Phosphatase 96 38 - 126 U/L   Total Bilirubin 0.7 0.3 - 1.2 mg/dL   GFR calc non Af Amer 26 (L) >60 mL/min   GFR calc Af Amer 30 (L) >60 mL/min   Anion gap 16 (H) 5 - 15    Comment: Performed at [facility], [address], [place], [place] [address]  SARS Coronavirus 2 (CEPHEID- Performed in [place] hospital lab), Hosp Order     Status: None   Collection Time: 12/07/18  8:22 PM   Specimen: Nasopharyngeal Swab  Result Value Ref Range   SARS Coronavirus 2 NEGATIVE NEGATIVE    Comment: (NOTE) If result is NEGATIVE SARS-CoV-2 target nucleic acids are NOT DETECTED. The SARS-CoV-2 RNA is generally detectable in upper and lower  respiratory specimens during the acute phase of infection. The lowest  concentration of SARS-CoV-2 viral copies this assay can detect is 250  copies / mL. A negative result does not preclude SARS-CoV-2 infection  and should not be used as the sole basis for treatment or other  patient management decisions.  A negative result may occur with  improper specimen collection / handling, submission of specimen other  than nasopharyngeal swab, presence of viral mutation(s) within the  areas targeted by this assay, and inadequate number of viral copies  (<250 copies / mL). A negative result must be combined with clinical  observations, patient history, and epidemiological information. If result is POSITIVE SARS-CoV-2 target nucleic acids are DETECTED. The SARS-CoV-2 RNA is generally detectable in upper and lower  respiratory specimens dur ing the acute phase of infection.  Positive  results are indicative of active infection with SARS-CoV-2.  Clinical  correlation with patient history and other diagnostic information is  necessary to determine patient infection status.  Positive results do  not rule out bacterial infection or co-infection with other viruses. If result is PRESUMPTIVE  POSTIVE SARS-CoV-2 nucleic acids MAY BE PRESENT.   A presumptive positive result was obtained on the submitted specimen  and confirmed on repeat testing.  While 2019 novel coronavirus  (SARS-CoV-2) nucleic acids may be present in the submitted sample  additional confirmatory testing may be necessary for epidemiological  and / or clinical management purposes  to differentiate between  SARS-CoV-2 and other Sarbecovirus currently known to infect humans.  If clinically indicated additional testing with an alternate test  methodology [phone]) is advised. The SARS-CoV-2 RNA is generally  detectable in upper and lower respiratory sp  ecimens during the acute  phase of infection. The expected result is Negative. Fact Sheet for Patients:  StrictlyIdeas.no Fact Sheet for Healthcare Providers: BankingDealers.co.za This test is not yet approved or cleared by the [place] FDA and has been authorized for detection and/or diagnosis of SARS-CoV-2 by FDA under an Emergency Use Authorization (EUA).  This EUA will remain in effect (meaning this test can be used) for the duration of the COVID-19 declaration under Section 564(b)(1) of the Act, 21 U.S.C. section 360bbb-3(b)(1), unless the authorization is terminated or revoked sooner. Performed at [facility], [address] [place], [place] [address]     Radiology Dg Chest 2 View  Result Date: 12/07/2018 CLINICAL DATA:  Recent fall with chest pain EXAM: CHEST - 2 VIEW COMPARISON:  04/05/2017 FINDINGS: Left-sided pacing device as before. No significant pleural effusion. Mild cardiomegaly. No acute airspace disease. No pneumothorax. Possible step-off deformity at the right humeral neck. Chronic deformity of the proximal left humerus. IMPRESSION: 1. Cardiomegaly without acute airspace disease, pleural effusion or pneumothorax 2. Possible step-off/fracture deformity of the proximal right humerus.  Recommended dedicated right shoulder radiographs Electronically Signed   By: Donavan Foil M.D.   On: 12/07/2018 20:37   Dg Lumbar Spine 2-3 Views  Result Date: 12/07/2018 CLINICAL DATA:  Recent fall with low back pain, initial encounter EXAM: LUMBAR SPINE - 3 VIEW COMPARISON:  CT from 07/04/2017 FINDINGS: Pedicle screws are noted from L2-L5. L2 compression deformity is noted. Degenerative changes at T12-L1 are seen. Chronic compression deformity at L4 is noted as well. No acute fracture is seen. No hardware failure is noted. IMPRESSION: Chronic changes without acute abnormality. Electronically Signed   By: Inez Catalina M.D.   On: 12/07/2018 20:36    Assessment/Plan Diabetes (HCC) blood glucose control important in reducing the progression of atherosclerotic disease. Also, involved in wound healing. On appropriate medications.  Hypertension blood pressure control important in reducing the progression of atherosclerotic disease. On appropriate oral medications.  Venous ulcer of right leg (HCC) Continue weekly Unna boots.  A 3 layer Unna boot was placed today.  Follow-up in 3 to 4 weeks  Venous ulcer of left leg (HCC) Continue weekly Unna boots.  A 3 layer Unna boot was placed today.  Follow-up in 3 to 4 weeks  Lymphedema Her swelling seems to be worse despite weekly Unna boots.  I am concerned she may have some degree of volume overload at this point.  We will try to discuss with her primary care physician at some point this week.    Leotis Pain, MD  12/17/2018 11:53 AM    This note was created with Dragon medical transcription system.  Any errors from dictation are purely unintentional

## 2018-12-17 NOTE — Assessment & Plan Note (Signed)
Her swelling seems to be worse despite weekly Unna boots.  I am concerned she may have some degree of volume overload at this point.  We will try to discuss with her primary care physician at some point this week.

## 2018-12-17 NOTE — Assessment & Plan Note (Signed)
Continue weekly Unna boots.  A 3 layer Unna boot was placed today.  Follow-up in 3 to 4 weeks

## 2018-12-24 ENCOUNTER — Ambulatory Visit (INDEPENDENT_AMBULATORY_CARE_PROVIDER_SITE_OTHER): Payer: Medicare Other | Admitting: Nurse Practitioner

## 2018-12-24 ENCOUNTER — Encounter (INDEPENDENT_AMBULATORY_CARE_PROVIDER_SITE_OTHER): Payer: Self-pay

## 2018-12-24 ENCOUNTER — Other Ambulatory Visit: Payer: Self-pay

## 2018-12-24 VITALS — BP 133/74 | HR 88 | Resp 18

## 2018-12-24 DIAGNOSIS — L97919 Non-pressure chronic ulcer of unspecified part of right lower leg with unspecified severity: Secondary | ICD-10-CM | POA: Diagnosis not present

## 2018-12-24 DIAGNOSIS — I83019 Varicose veins of right lower extremity with ulcer of unspecified site: Secondary | ICD-10-CM

## 2018-12-24 DIAGNOSIS — I83029 Varicose veins of left lower extremity with ulcer of unspecified site: Secondary | ICD-10-CM | POA: Diagnosis not present

## 2018-12-24 DIAGNOSIS — L97929 Non-pressure chronic ulcer of unspecified part of left lower leg with unspecified severity: Secondary | ICD-10-CM

## 2018-12-24 NOTE — Progress Notes (Signed)
History of Present Illness  There is no documented history at this time  Assessments & Plan   There are no diagnoses linked to this encounter.    Additional instructions  Subjective:  Patient presents with venous ulcer of the Bilateral lower extremity.    Procedure:  3 layer unna wrap was placed Bilateral lower extremity.   Plan:   Follow up in one week.  

## 2018-12-25 ENCOUNTER — Encounter (INDEPENDENT_AMBULATORY_CARE_PROVIDER_SITE_OTHER): Payer: Self-pay | Admitting: Nurse Practitioner

## 2018-12-30 ENCOUNTER — Other Ambulatory Visit (INDEPENDENT_AMBULATORY_CARE_PROVIDER_SITE_OTHER): Payer: Self-pay | Admitting: Nurse Practitioner

## 2018-12-30 ENCOUNTER — Telehealth (INDEPENDENT_AMBULATORY_CARE_PROVIDER_SITE_OTHER): Payer: Self-pay | Admitting: Vascular Surgery

## 2018-12-30 DIAGNOSIS — I83029 Varicose veins of left lower extremity with ulcer of unspecified site: Secondary | ICD-10-CM

## 2018-12-30 DIAGNOSIS — L97929 Non-pressure chronic ulcer of unspecified part of left lower leg with unspecified severity: Secondary | ICD-10-CM

## 2018-12-30 NOTE — Telephone Encounter (Signed)
I have placed the referral

## 2018-12-30 NOTE — Telephone Encounter (Signed)
Ms Debra Butler has been informed the referral has been place to requested [facility] and she will inform the patient with information

## 2018-12-31 ENCOUNTER — Ambulatory Visit (INDEPENDENT_AMBULATORY_CARE_PROVIDER_SITE_OTHER): Payer: Medicare Other | Admitting: Nurse Practitioner

## 2018-12-31 ENCOUNTER — Encounter (INDEPENDENT_AMBULATORY_CARE_PROVIDER_SITE_OTHER): Payer: Self-pay

## 2018-12-31 ENCOUNTER — Other Ambulatory Visit: Payer: Self-pay

## 2018-12-31 ENCOUNTER — Other Ambulatory Visit (INDEPENDENT_AMBULATORY_CARE_PROVIDER_SITE_OTHER): Payer: Self-pay | Admitting: Nurse Practitioner

## 2018-12-31 VITALS — BP 148/65 | HR 98 | Resp 16

## 2018-12-31 DIAGNOSIS — L97919 Non-pressure chronic ulcer of unspecified part of right lower leg with unspecified severity: Secondary | ICD-10-CM

## 2018-12-31 DIAGNOSIS — I83019 Varicose veins of right lower extremity with ulcer of unspecified site: Secondary | ICD-10-CM | POA: Diagnosis not present

## 2018-12-31 DIAGNOSIS — I83029 Varicose veins of left lower extremity with ulcer of unspecified site: Secondary | ICD-10-CM

## 2018-12-31 DIAGNOSIS — L97929 Non-pressure chronic ulcer of unspecified part of left lower leg with unspecified severity: Secondary | ICD-10-CM | POA: Diagnosis not present

## 2018-12-31 NOTE — Progress Notes (Signed)
History of Present Illness  There is no documented history at this time  Assessments & Plan   There are no diagnoses linked to this encounter.    Additional instructions  Subjective:  Patient presents with venous ulcer of the Bilateral lower extremity.    Procedure:  3 layer unna wrap was placed Bilateral lower extremity.   Plan:   Follow up in one week.  

## 2019-01-01 ENCOUNTER — Other Ambulatory Visit (INDEPENDENT_AMBULATORY_CARE_PROVIDER_SITE_OTHER): Payer: Self-pay | Admitting: Vascular Surgery

## 2019-01-03 ENCOUNTER — Telehealth (INDEPENDENT_AMBULATORY_CARE_PROVIDER_SITE_OTHER): Payer: Self-pay | Admitting: Nurse Practitioner

## 2019-01-03 LAB — AEROBIC CULTURE

## 2019-01-03 NOTE — Telephone Encounter (Signed)
Ria Comment with [facility] [phone] called requesting information about referral for homehealth to do unna boot dressing. Asking what unna boot we use on patient in office. I advised her that we do a 3 layer unna boot which includes zinc wrap and 2 layer coban wrap. Ria Comment verbalized understanding. AS, CMA

## 2019-01-07 ENCOUNTER — Telehealth (INDEPENDENT_AMBULATORY_CARE_PROVIDER_SITE_OTHER): Payer: Self-pay

## 2019-01-07 ENCOUNTER — Other Ambulatory Visit (INDEPENDENT_AMBULATORY_CARE_PROVIDER_SITE_OTHER): Payer: Self-pay | Admitting: Nurse Practitioner

## 2019-01-07 ENCOUNTER — Ambulatory Visit (INDEPENDENT_AMBULATORY_CARE_PROVIDER_SITE_OTHER): Payer: Medicare Other | Admitting: Nurse Practitioner

## 2019-01-07 NOTE — Telephone Encounter (Signed)
Home health nurse called about wound orders for patient and wanted to see was it fine to use calcium unna on left leg and calcium alginate on right leg until patient order has arrive.I spoke with Eulogio Ditch NP and she is was fine.

## 2019-01-08 ENCOUNTER — Other Ambulatory Visit (INDEPENDENT_AMBULATORY_CARE_PROVIDER_SITE_OTHER): Payer: Self-pay | Admitting: Nurse Practitioner

## 2019-01-08 MED ORDER — CIPROFLOXACIN HCL 500 MG PO TABS: 500.0000 mg | ORAL_TABLET | Freq: Every day | ORAL | 0 refills | Status: DC

## 2019-01-08 NOTE — Progress Notes (Signed)
Can you give Debra Butler a call and let her know that I have called in Cipro for her to take for her leg.  The swab that we took showed growth of a germ that doesn't respond well to Keflex but Cipro should work much better.  She will take it for 10 days.

## 2019-01-08 NOTE — Progress Notes (Signed)
Patient has been aware with information

## 2019-01-15 ENCOUNTER — Other Ambulatory Visit (INDEPENDENT_AMBULATORY_CARE_PROVIDER_SITE_OTHER): Payer: Self-pay | Admitting: Nurse Practitioner

## 2019-01-15 ENCOUNTER — Telehealth (INDEPENDENT_AMBULATORY_CARE_PROVIDER_SITE_OTHER): Payer: Self-pay

## 2019-01-15 DIAGNOSIS — L97919 Non-pressure chronic ulcer of unspecified part of right lower leg with unspecified severity: Secondary | ICD-10-CM

## 2019-01-15 DIAGNOSIS — I83029 Varicose veins of left lower extremity with ulcer of unspecified site: Secondary | ICD-10-CM

## 2019-01-15 NOTE — Telephone Encounter (Signed)
Her last ABIs were [place] so lets bring her in with bilateral art duplexes.  She can see me or dew

## 2019-01-17 ENCOUNTER — Encounter (INDEPENDENT_AMBULATORY_CARE_PROVIDER_SITE_OTHER): Payer: Medicare Other

## 2019-01-17 ENCOUNTER — Ambulatory Visit (INDEPENDENT_AMBULATORY_CARE_PROVIDER_SITE_OTHER): Payer: Medicare Other | Admitting: Nurse Practitioner

## 2019-01-20 ENCOUNTER — Telehealth: Payer: Self-pay | Admitting: Primary Care

## 2019-01-20 ENCOUNTER — Other Ambulatory Visit: Payer: Self-pay

## 2019-01-20 DIAGNOSIS — Z20822 Contact with and (suspected) exposure to covid-19: Secondary | ICD-10-CM

## 2019-01-20 NOTE — Telephone Encounter (Signed)
T/c to home and left message with caregiver. She will give message to POA who will return call.

## 2019-01-21 ENCOUNTER — Telehealth (INDEPENDENT_AMBULATORY_CARE_PROVIDER_SITE_OTHER): Payer: Self-pay | Admitting: Nurse Practitioner

## 2019-01-21 ENCOUNTER — Other Ambulatory Visit (INDEPENDENT_AMBULATORY_CARE_PROVIDER_SITE_OTHER): Payer: Self-pay | Admitting: Nurse Practitioner

## 2019-01-21 LAB — NOVEL CORONAVIRUS, NAA: SARS-CoV-2, NAA: NOT DETECTED

## 2019-01-21 NOTE — Telephone Encounter (Signed)
Patient called office requesting refill of Tramadol. She would like med sent to [facility] in [place]. Please advise. AS, CMA

## 2019-01-21 NOTE — Telephone Encounter (Signed)
Tramadol called into [facility] per Dr. Lucky Cowboy. Spoke with Debra Butler-patient POA because I attempted to call patient and was unable to get through. Webb Silversmith was advised that the med has been sent in. AS, CMA

## 2019-01-27 ENCOUNTER — Telehealth (INDEPENDENT_AMBULATORY_CARE_PROVIDER_SITE_OTHER): Payer: Self-pay

## 2019-01-27 NOTE — Telephone Encounter (Signed)
Ms Lelon Frohlich left a message stating that Ms Helma has taking her last antibiotic and wanted to know if she needed a refill.I spoke with Eulogio Ditch NP and she stated at this time we will not refill the medication because she has an appointment schedule with [facility] on 02/04/2019 and we will let them make a decision to see if she should continue antibiotics

## 2019-01-28 ENCOUNTER — Emergency Department
Admission: EM | Admit: 2019-01-28 | Discharge: 2019-01-28 | Disposition: A | Discharge status: Home / Self Care | Payer: Medicare Other | Attending: Emergency Medicine | Admitting: Emergency Medicine

## 2019-01-28 ENCOUNTER — Telehealth (INDEPENDENT_AMBULATORY_CARE_PROVIDER_SITE_OTHER): Payer: Self-pay | Admitting: Vascular Surgery

## 2019-01-28 ENCOUNTER — Encounter: Payer: Self-pay | Admitting: Emergency Medicine

## 2019-01-28 ENCOUNTER — Other Ambulatory Visit: Payer: Self-pay

## 2019-01-28 DIAGNOSIS — I252 Old myocardial infarction: Secondary | ICD-10-CM | POA: Insufficient documentation

## 2019-01-28 DIAGNOSIS — R040 Epistaxis: Secondary | ICD-10-CM | POA: Diagnosis not present

## 2019-01-28 DIAGNOSIS — Z950 Presence of cardiac pacemaker: Secondary | ICD-10-CM | POA: Insufficient documentation

## 2019-01-28 DIAGNOSIS — Z96653 Presence of artificial knee joint, bilateral: Secondary | ICD-10-CM | POA: Diagnosis not present

## 2019-01-28 DIAGNOSIS — Z7901 Long term (current) use of anticoagulants: Secondary | ICD-10-CM | POA: Diagnosis not present

## 2019-01-28 DIAGNOSIS — I10 Essential (primary) hypertension: Secondary | ICD-10-CM | POA: Insufficient documentation

## 2019-01-28 DIAGNOSIS — E119 Type 2 diabetes mellitus without complications: Secondary | ICD-10-CM | POA: Insufficient documentation

## 2019-01-28 LAB — CBC WITH DIFFERENTIAL/PLATELET
Abs Immature Granulocytes: 0.26 10*3/uL — ABNORMAL HIGH (ref 0.00–0.07)
Basophils Absolute: 0.1 10*3/uL (ref 0.0–0.1)
Basophils Relative: 2 %
Eosinophils Absolute: 0.1 10*3/uL (ref 0.0–0.5)
Eosinophils Relative: 2 %
HCT: 30 % — ABNORMAL LOW (ref 36.0–46.0)
Hemoglobin: 9.7 g/dL — ABNORMAL LOW (ref 12.0–15.0)
Immature Granulocytes: 4 %
Lymphocytes Relative: 10 %
Lymphs Abs: 0.7 10*3/uL (ref 0.7–4.0)
MCH: 35 pg — ABNORMAL HIGH (ref 26.0–34.0)
MCHC: 32.3 g/dL (ref 30.0–36.0)
MCV: 108.3 fL — ABNORMAL HIGH (ref 80.0–100.0)
Monocytes Absolute: 1 10*3/uL (ref 0.1–1.0)
Monocytes Relative: 14 %
Neutro Abs: 4.7 10*3/uL (ref 1.7–7.7)
Neutrophils Relative %: 68 %
Platelets: 168 10*3/uL (ref 150–400)
RBC: 2.77 MIL/uL — ABNORMAL LOW (ref 3.87–5.11)
RDW: 16.1 % — ABNORMAL HIGH (ref 11.5–15.5)
WBC: 6.9 10*3/uL (ref 4.0–10.5)
nRBC: 0.3 % — ABNORMAL HIGH (ref 0.0–0.2)

## 2019-01-28 LAB — COMPREHENSIVE METABOLIC PANEL
ALT: 11 U/L (ref 0–44)
AST: 15 U/L (ref 15–41)
Albumin: 3.4 g/dL — ABNORMAL LOW (ref 3.5–5.0)
Alkaline Phosphatase: 76 U/L (ref 38–126)
Anion gap: 12 (ref 5–15)
BUN: 30 mg/dL — ABNORMAL HIGH (ref 8–23)
CO2: 31 mmol/L (ref 22–32)
Calcium: 8.9 mg/dL (ref 8.9–10.3)
Chloride: 94 mmol/L — ABNORMAL LOW (ref 98–111)
Creatinine, Ser: 1.43 mg/dL — ABNORMAL HIGH (ref 0.44–1.00)
GFR calc Af Amer: 36 mL/min — ABNORMAL LOW (ref >60)
GFR calc non Af Amer: 31 mL/min — ABNORMAL LOW (ref >60)
Glucose, Bld: 159 mg/dL — ABNORMAL HIGH (ref 70–99)
Potassium: 4 mmol/L (ref 3.5–5.1)
Sodium: 137 mmol/L (ref 135–145)
Total Bilirubin: 0.9 mg/dL (ref 0.3–1.2)
Total Protein: 6.5 g/dL (ref 6.5–8.1)

## 2019-01-28 LAB — PROTIME-INR
INR: 1.6 — ABNORMAL HIGH (ref 0.8–1.2)
Prothrombin Time: 18.6 seconds — ABNORMAL HIGH (ref 11.4–15.2)

## 2019-01-28 NOTE — ED Provider Notes (Signed)
[facility] Emergency Department Provider Note       Time seen: ----------------------------------------- 10:01 AM on 01/28/2019 -----------------------------------------   I have reviewed the triage vital signs and the nursing notes.  HISTORY   Chief Complaint Epistaxis    HPI Debra Butler is a 85 y.o. female with a history of arthritis, DVT, diabetes, hypertension, IBS, MI who presents to the ED for epistaxis that started this morning at 830.  She is on Coumadin, a clamp was applied by EMS along with Afrin to both naris.  Clamp was removed here with no bleeding.  She does report a history of epistaxis in the past.  Past Medical History:  Diagnosis Date  . Arthritis   . Blood clot in vein 2013   left leg  . Diabetes mellitus without complication ([place]) 0000000  . Hypertension 1988  . IBS (irritable bowel syndrome)   . Myocardial infarction [facility])     Patient Active Problem List   Diagnosis Date Noted  . Venous ulcer of left leg ([place]) 07/09/2018  . Venous ulcer of right leg ([place]) 06/10/2018  . Lower extremity pain, bilateral 11/20/2017  . Blister 10/31/2017  . Chest pain 09/06/2016  . Lower extremity edema 07/31/2016  . Diabetes ([place]) 04/03/2016  . Lymphedema 04/03/2016  . Swelling of limb 04/03/2016  . Hypertension 05/29/1986    Past Surgical History:  Procedure Laterality Date  . ABDOMINAL HYSTERECTOMY  age 75   partial, still has ovaries and tubes  . APPENDECTOMY    . BACK SURGERY     screws and titanium  . BREAST BIOPSY     at age 31, benign  . CARPAL TUNNEL RELEASE Right 2010  . CARPAL TUNNEL RELEASE Left 07/25/2017   Procedure: CARPAL TUNNEL RELEASE;  Surgeon: Earnestine Leys, MD;  Location: [facility] ORS;  Service: Orthopedics;  Laterality: Left;  . CHOLECYSTECTOMY    . COLON SURGERY  1984  . COLONOSCOPY  2007, 2016   Dr Jamal Collin  . FOOT SURGERY    . FRACTURE SURGERY  2010   hip  . FRACTURE SURGERY  2009   femur  . JOINT  REPLACEMENT     both knee  . PACEMAKER PLACEMENT  2013  . TONSILLECTOMY    . UPPER GI ENDOSCOPY  2016   Dr Jamal Collin    Allergies Sulphur [sulfur] and Phenergan [promethazine]  Social History Social History   Tobacco Use  . Smoking status: Never Smoker  . Smokeless tobacco: Never Used  Substance Use Topics  . Alcohol use: No  . Drug use: No   Review of Systems Constitutional: Negative for fever. HEENT: Positive for epistaxis Cardiovascular: Negative for chest pain. Respiratory: Negative for shortness of breath. Gastrointestinal: Negative for abdominal pain, vomiting and diarrhea. Musculoskeletal: Negative for back pain. Skin: Negative for rash. Neurological: Negative for headaches, focal weakness or numbness.  All systems negative/normal/unremarkable except as stated in the HPI  ____________________________________________   PHYSICAL EXAM:  VITAL SIGNS: ED Triage Vitals  Enc Vitals Group     BP 01/28/19 0956 (!) 110/53     Pulse Rate 01/28/19 0956 98     Resp 01/28/19 0956 20     Temp 01/28/19 0956 97.9 F (36.6 C)     Temp Source 01/28/19 0956 Axillary     SpO2 --      Weight --      Height --      Head Circumference --      Peak Flow --  Pain Score 01/28/19 1000 0     Pain Loc --      Pain Edu? --      Excl. in [place]? --    Constitutional: Alert and oriented. Well appearing and in no distress. Eyes: Conjunctivae are normal. Normal extraocular movements. ENT      Head: Normocephalic and atraumatic.      Nose: No congestion/rhinnorhea.  No active epistaxis noted at this time      Mouth/Throat: Mucous membranes are moist.      Neck: No stridor. Cardiovascular: Normal rate, regular rhythm. No murmurs, rubs, or gallops. Respiratory: Normal respiratory effort without tachypnea nor retractions. Breath sounds are clear and equal bilaterally. No wheezes/rales/rhonchi. Gastrointestinal: Soft and nontender. Normal bowel sounds Musculoskeletal: Nontender with  normal range of motion in extremities. No lower extremity tenderness nor edema. Neurologic:  Normal speech and language. No gross focal neurologic deficits are appreciated.  Skin:  Skin is warm, dry and intact. No rash noted. Psychiatric: Mood and affect are normal. Speech and behavior are normal.  ___________________________________________  ED COURSE:  As part of my medical decision making, I reviewed the following data within the [place] History obtained from family if available, nursing notes, old chart and ekg, as well as notes from prior ED visits. Patient presented for epistaxis, we will assess with labs and imaging as indicated at this time.   Procedures  Debra Butler was evaluated in Emergency Department on 01/28/2019 for the symptoms described in the history of present illness. She was evaluated in the context of the global COVID-19 pandemic, which necessitated consideration that the patient might be at risk for infection with the SARS-CoV-2 virus that causes COVID-19. Institutional protocols and algorithms that pertain to the evaluation of patients at risk for COVID-19 are in a state of rapid change based on information released by regulatory bodies including the CDC and federal and state organizations. These policies and algorithms were followed during the patient's care in the ED.  ____________________________________________   LABS (pertinent positives/negatives)  Labs Reviewed  CBC WITH DIFFERENTIAL/PLATELET - Abnormal; Notable for the following components:      Result Value   RBC 2.77 (*)    Hemoglobin 9.7 (*)    HCT 30.0 (*)    MCV 108.3 (*)    MCH 35.0 (*)    RDW 16.1 (*)    nRBC 0.3 (*)    Abs Immature Granulocytes 0.26 (*)    All other components within normal limits  COMPREHENSIVE METABOLIC PANEL - Abnormal; Notable for the following components:   Chloride 94 (*)    Glucose, Bld 159 (*)    BUN 30 (*)    Creatinine, Ser 1.43 (*)    Albumin  3.4 (*)    GFR calc non Af Amer 31 (*)    GFR calc Af Amer 36 (*)    All other components within normal limits  PROTIME-INR - Abnormal; Notable for the following components:   Prothrombin Time 18.6 (*)    INR 1.6 (*)    All other components within normal limits  ____________________________________________   DIFFERENTIAL DIAGNOSIS   Epistaxis, coagulopathy, anemia  FINAL ASSESSMENT AND PLAN  Epistaxis   Plan: The patient had presented for a left-sided nosebleed. Patient's labs are reassuring, INR is 1.6.  She has not had any rebleeding after being observed here for several hours.  She is advised to use Afrin should the bleeding restart and to place a nasal clamp.  She is  advised she has had intermittent epistaxis over the years, I will refer for ENT for follow-up.  Advise holding Coumadin for 24 hours.  Laurence Aly, MD    Note: This note was generated in part or whole with voice recognition software. Voice recognition is usually quite accurate but there are transcription errors that can and very often do occur. I apologize for any typographical errors that were not detected and corrected.     Earleen Newport, MD 01/28/19 1209

## 2019-01-28 NOTE — ED Triage Notes (Signed)
Pt started with nosebleed this morning at 0830. Is on coumadin.  Clamp applied by EMS along with afrin spray both nare.  When clamp removed no active bleeding noted.

## 2019-01-28 NOTE — Telephone Encounter (Signed)
Marissa with [facility] [phone] requesting order for Tramadol so they can administer medication to patient. I faxed over medication administration order for Tramadol to [phone])

## 2019-02-06 ENCOUNTER — Telehealth (INDEPENDENT_AMBULATORY_CARE_PROVIDER_SITE_OTHER): Payer: Self-pay | Admitting: Nurse Practitioner

## 2019-02-06 NOTE — Telephone Encounter (Signed)
That is fine 

## 2019-02-06 NOTE — Telephone Encounter (Signed)
Left message on [place] with verbal order. AS, CMA

## 2019-02-06 NOTE — Telephone Encounter (Signed)
Debra Butler with [facility] calling requesting verbal okay for her to take orders from Dr. Kalman Shan and his PA from [facility] wound center. Please advise. AS, CMA

## 2019-03-10 ENCOUNTER — Inpatient Hospital Stay
Admission: EM | Admit: 2019-03-10 | Discharge: 2019-03-18 | DRG: 299 | Disposition: A | Discharge status: Hospice | Payer: Medicare Other | Source: Skilled Nursing Facility | Attending: Internal Medicine | Admitting: Internal Medicine

## 2019-03-10 ENCOUNTER — Emergency Department: Payer: Medicare Other

## 2019-03-10 ENCOUNTER — Other Ambulatory Visit: Payer: Self-pay

## 2019-03-10 ENCOUNTER — Encounter: Payer: Self-pay | Admitting: Emergency Medicine

## 2019-03-10 DIAGNOSIS — Z7189 Other specified counseling: Secondary | ICD-10-CM | POA: Diagnosis not present

## 2019-03-10 DIAGNOSIS — K6289 Other specified diseases of anus and rectum: Secondary | ICD-10-CM | POA: Diagnosis present

## 2019-03-10 DIAGNOSIS — E876 Hypokalemia: Secondary | ICD-10-CM | POA: Diagnosis present

## 2019-03-10 DIAGNOSIS — R509 Fever, unspecified: Secondary | ICD-10-CM | POA: Diagnosis not present

## 2019-03-10 DIAGNOSIS — Z90710 Acquired absence of both cervix and uterus: Secondary | ICD-10-CM

## 2019-03-10 DIAGNOSIS — I480 Paroxysmal atrial fibrillation: Secondary | ICD-10-CM | POA: Diagnosis present

## 2019-03-10 DIAGNOSIS — I4891 Unspecified atrial fibrillation: Secondary | ICD-10-CM

## 2019-03-10 DIAGNOSIS — M79673 Pain in unspecified foot: Secondary | ICD-10-CM | POA: Diagnosis present

## 2019-03-10 DIAGNOSIS — Z79899 Other long term (current) drug therapy: Secondary | ICD-10-CM

## 2019-03-10 DIAGNOSIS — D500 Iron deficiency anemia secondary to blood loss (chronic): Secondary | ICD-10-CM | POA: Diagnosis present

## 2019-03-10 DIAGNOSIS — I890 Lymphedema, not elsewhere classified: Secondary | ICD-10-CM | POA: Diagnosis present

## 2019-03-10 DIAGNOSIS — L97919 Non-pressure chronic ulcer of unspecified part of right lower leg with unspecified severity: Secondary | ICD-10-CM | POA: Diagnosis present

## 2019-03-10 DIAGNOSIS — E1140 Type 2 diabetes mellitus with diabetic neuropathy, unspecified: Secondary | ICD-10-CM | POA: Diagnosis present

## 2019-03-10 DIAGNOSIS — L89154 Pressure ulcer of sacral region, stage 4: Secondary | ICD-10-CM | POA: Diagnosis present

## 2019-03-10 DIAGNOSIS — K625 Hemorrhage of anus and rectum: Secondary | ICD-10-CM

## 2019-03-10 DIAGNOSIS — K922 Gastrointestinal hemorrhage, unspecified: Secondary | ICD-10-CM | POA: Diagnosis present

## 2019-03-10 DIAGNOSIS — Z20828 Contact with and (suspected) exposure to other viral communicable diseases: Secondary | ICD-10-CM | POA: Diagnosis present

## 2019-03-10 DIAGNOSIS — Z7982 Long term (current) use of aspirin: Secondary | ICD-10-CM

## 2019-03-10 DIAGNOSIS — I96 Gangrene, not elsewhere classified: Secondary | ICD-10-CM | POA: Diagnosis present

## 2019-03-10 DIAGNOSIS — Z950 Presence of cardiac pacemaker: Secondary | ICD-10-CM

## 2019-03-10 DIAGNOSIS — L0231 Cutaneous abscess of buttock: Secondary | ICD-10-CM | POA: Diagnosis present

## 2019-03-10 DIAGNOSIS — R110 Nausea: Secondary | ICD-10-CM | POA: Diagnosis present

## 2019-03-10 DIAGNOSIS — E11622 Type 2 diabetes mellitus with other skin ulcer: Secondary | ICD-10-CM | POA: Diagnosis present

## 2019-03-10 DIAGNOSIS — Z96653 Presence of artificial knee joint, bilateral: Secondary | ICD-10-CM | POA: Diagnosis present

## 2019-03-10 DIAGNOSIS — Z515 Encounter for palliative care: Secondary | ICD-10-CM | POA: Diagnosis not present

## 2019-03-10 DIAGNOSIS — I10 Essential (primary) hypertension: Secondary | ICD-10-CM | POA: Diagnosis present

## 2019-03-10 DIAGNOSIS — Z86718 Personal history of other venous thrombosis and embolism: Secondary | ICD-10-CM

## 2019-03-10 DIAGNOSIS — R5381 Other malaise: Secondary | ICD-10-CM | POA: Diagnosis present

## 2019-03-10 DIAGNOSIS — Z7984 Long term (current) use of oral hypoglycemic drugs: Secondary | ICD-10-CM

## 2019-03-10 DIAGNOSIS — I878 Other specified disorders of veins: Secondary | ICD-10-CM | POA: Diagnosis present

## 2019-03-10 DIAGNOSIS — L98423 Non-pressure chronic ulcer of back with necrosis of muscle: Secondary | ICD-10-CM | POA: Diagnosis present

## 2019-03-10 DIAGNOSIS — Z66 Do not resuscitate: Secondary | ICD-10-CM | POA: Diagnosis not present

## 2019-03-10 DIAGNOSIS — L97929 Non-pressure chronic ulcer of unspecified part of left lower leg with unspecified severity: Secondary | ICD-10-CM | POA: Diagnosis present

## 2019-03-10 DIAGNOSIS — E1152 Type 2 diabetes mellitus with diabetic peripheral angiopathy with gangrene: Secondary | ICD-10-CM | POA: Diagnosis present

## 2019-03-10 DIAGNOSIS — L89153 Pressure ulcer of sacral region, stage 3: Secondary | ICD-10-CM

## 2019-03-10 DIAGNOSIS — I252 Old myocardial infarction: Secondary | ICD-10-CM

## 2019-03-10 DIAGNOSIS — Z79890 Hormone replacement therapy (postmenopausal): Secondary | ICD-10-CM

## 2019-03-10 DIAGNOSIS — K589 Irritable bowel syndrome without diarrhea: Secondary | ICD-10-CM | POA: Diagnosis present

## 2019-03-10 DIAGNOSIS — Z7901 Long term (current) use of anticoagulants: Secondary | ICD-10-CM

## 2019-03-10 DIAGNOSIS — L03818 Cellulitis of other sites: Secondary | ICD-10-CM | POA: Diagnosis present

## 2019-03-10 DIAGNOSIS — E1151 Type 2 diabetes mellitus with diabetic peripheral angiopathy without gangrene: Secondary | ICD-10-CM | POA: Diagnosis present

## 2019-03-10 DIAGNOSIS — Z888 Allergy status to other drugs, medicaments and biological substances status: Secondary | ICD-10-CM

## 2019-03-10 LAB — CBC
HCT: 25.9 % — ABNORMAL LOW (ref 36.0–46.0)
Hemoglobin: 8.5 g/dL — ABNORMAL LOW (ref 12.0–15.0)
MCH: 35 pg — ABNORMAL HIGH (ref 26.0–34.0)
MCHC: 32.8 g/dL (ref 30.0–36.0)
MCV: 106.6 fL — ABNORMAL HIGH (ref 80.0–100.0)
Platelets: 172 10*3/uL (ref 150–400)
RBC: 2.43 MIL/uL — ABNORMAL LOW (ref 3.87–5.11)
RDW: 17.9 % — ABNORMAL HIGH (ref 11.5–15.5)
WBC: 9.4 10*3/uL (ref 4.0–10.5)
nRBC: 0.5 % — ABNORMAL HIGH (ref 0.0–0.2)

## 2019-03-10 LAB — COMPREHENSIVE METABOLIC PANEL
ALT: 7 U/L (ref 0–44)
AST: 19 U/L (ref 15–41)
Albumin: 3.1 g/dL — ABNORMAL LOW (ref 3.5–5.0)
Alkaline Phosphatase: 51 U/L (ref 38–126)
Anion gap: 7 (ref 5–15)
BUN: 26 mg/dL — ABNORMAL HIGH (ref 8–23)
CO2: 27 mmol/L (ref 22–32)
Calcium: 7.3 mg/dL — ABNORMAL LOW (ref 8.9–10.3)
Chloride: 102 mmol/L (ref 98–111)
Creatinine, Ser: 1.55 mg/dL — ABNORMAL HIGH (ref 0.44–1.00)
GFR calc Af Amer: 33 mL/min — ABNORMAL LOW (ref >60)
GFR calc non Af Amer: 28 mL/min — ABNORMAL LOW (ref >60)
Glucose, Bld: 142 mg/dL — ABNORMAL HIGH (ref 70–99)
Potassium: 3.2 mmol/L — ABNORMAL LOW (ref 3.5–5.1)
Sodium: 136 mmol/L (ref 135–145)
Total Bilirubin: 1.4 mg/dL — ABNORMAL HIGH (ref 0.3–1.2)
Total Protein: 5.7 g/dL — ABNORMAL LOW (ref 6.5–8.1)

## 2019-03-10 LAB — TYPE AND SCREEN
ABO/RH(D): O POS
Antibody Screen: NEGATIVE

## 2019-03-10 LAB — PROTIME-INR
INR: 1.3 — ABNORMAL HIGH (ref 0.8–1.2)
Prothrombin Time: 16 seconds — ABNORMAL HIGH (ref 11.4–15.2)

## 2019-03-10 MED ORDER — CLORAZEPATE DIPOTASSIUM 3.75 MG PO TABS
3.7500 mg | ORAL_TABLET | Freq: Every day | ORAL | Status: DC
  Administered 2019-03-11 – 2019-03-15 (×2): 3.75 mg via ORAL
  Filled 2019-03-10 (×3): qty 1

## 2019-03-10 MED ORDER — PREGABALIN 50 MG PO CAPS
100.0000 mg | ORAL_CAPSULE | Freq: Two times a day (BID) | ORAL | Status: DC
  Administered 2019-03-11: 100 mg via ORAL
  Filled 2019-03-10: qty 2

## 2019-03-10 MED ORDER — NITROGLYCERIN 0.4 MG SL SUBL: 0.4000 mg | SUBLINGUAL_TABLET | SUBLINGUAL | Status: DC | PRN

## 2019-03-10 MED ORDER — LEVOTHYROXINE SODIUM 112 MCG PO TABS
112.0000 ug | ORAL_TABLET | Freq: Every day | ORAL | Status: DC
  Administered 2019-03-11 – 2019-03-18 (×2): 112 ug via ORAL
  Filled 2019-03-10 (×8): qty 1

## 2019-03-10 MED ORDER — VANCOMYCIN HCL IN DEXTROSE 1-5 GM/200ML-% IV SOLN
1000.0000 mg | Freq: Once | INTRAVENOUS | Status: AC
  Administered 2019-03-10: 14:00:00 1000 mg via INTRAVENOUS
  Filled 2019-03-10: qty 200

## 2019-03-10 MED ORDER — INSULIN ASPART 100 UNIT/ML ~~LOC~~ SOLN
0.0000 [IU] | Freq: Three times a day (TID) | SUBCUTANEOUS | Status: DC
  Administered 2019-03-11 (×3): 2 [IU] via SUBCUTANEOUS
  Filled 2019-03-10 (×3): qty 1

## 2019-03-10 MED ORDER — SODIUM CHLORIDE 0.9 % IV SOLN
INTRAVENOUS | Status: DC
  Administered 2019-03-11 (×2): via INTRAVENOUS

## 2019-03-10 MED ORDER — PHENYLEPH-SHARK LIV OIL-MO-PET 0.25-3-14-71.9 % RE OINT
1.0000 "application " | TOPICAL_OINTMENT | Freq: Three times a day (TID) | RECTAL | Status: DC | PRN
  Filled 2019-03-10 (×2): qty 57

## 2019-03-10 MED ORDER — VANCOMYCIN HCL IN DEXTROSE 1-5 GM/200ML-% IV SOLN: 1000.0000 mg | Freq: Once | INTRAVENOUS | Status: DC

## 2019-03-10 MED ORDER — METRONIDAZOLE 500 MG PO TABS
500.0000 mg | ORAL_TABLET | Freq: Three times a day (TID) | ORAL | Status: DC
  Administered 2019-03-11 (×2): 500 mg via ORAL
  Filled 2019-03-10 (×2): qty 1

## 2019-03-10 MED ORDER — SODIUM CHLORIDE 0.9 % IV SOLN
2.0000 g | Freq: Once | INTRAVENOUS | Status: AC
  Administered 2019-03-10: 16:00:00 2 g via INTRAVENOUS
  Filled 2019-03-10: qty 20

## 2019-03-10 MED ORDER — DILTIAZEM HCL ER COATED BEADS 180 MG PO CP24
180.0000 mg | ORAL_CAPSULE | Freq: Every day | ORAL | Status: DC
  Filled 2019-03-10: qty 1

## 2019-03-10 MED ORDER — CYANOCOBALAMIN 1000 MCG/ML IJ SOLN: 1000.0000 ug | INTRAMUSCULAR | Status: DC

## 2019-03-10 MED ORDER — INSULIN ASPART 100 UNIT/ML ~~LOC~~ SOLN
0.0000 [IU] | Freq: Every day | SUBCUTANEOUS | Status: DC
  Administered 2019-03-11: 2 [IU] via SUBCUTANEOUS
  Filled 2019-03-10: qty 1

## 2019-03-10 MED ORDER — SODIUM CHLORIDE 0.9 % IV SOLN
2.0000 g | INTRAVENOUS | Status: DC
  Administered 2019-03-11: 18:00:00 2 g via INTRAVENOUS
  Filled 2019-03-10: qty 20
  Filled 2019-03-10: qty 2

## 2019-03-10 MED ORDER — VITAMIN D3 25 MCG (1000 UNIT) PO TABS
1000.0000 [IU] | ORAL_TABLET | Freq: Every day | ORAL | Status: DC
  Administered 2019-03-11: 1000 [IU] via ORAL
  Filled 2019-03-10 (×3): qty 1

## 2019-03-10 NOTE — ED Notes (Signed)
Changed pt's bandage on pressure ulcer at upper side of left buttock. Pt tolerated well. Pus and odor noted.

## 2019-03-10 NOTE — ED Notes (Signed)
Pt sating at 86% on RA. Pt asleep when this RN walked in room. This RN placed a new pulse ox to verify placement and accurate O2 reading. Pt sating 87% on RA. This RN placed pt in 1L [place]. Pt sating  98% at this time. RN [name] made aware.

## 2019-03-10 NOTE — Progress Notes (Signed)
PHARMACY -  BRIEF ANTIBIOTIC NOTE   Pharmacy has received consult(s) for vancomycin from an ED provider. The patient's profile has been reviewed for ht/wt/allergies/indication/available labs.    One time order(s) placed for vancomycin 1 g (total 2 g loading dose)  Further antibiotics/pharmacy consults should be ordered by admitting physician if indicated.                       Thank you,  Tawnya Crook, PharmD 03/10/2019  1:41 PM

## 2019-03-10 NOTE — ED Notes (Signed)
POA Ann at bedside

## 2019-03-10 NOTE — ED Triage Notes (Signed)
Pt via ems from [facility] [place]. She c/o feeling unwell today. Facility reports that she had gi bleed that was significant yesterday, but that there is no bleeding today. Pt reports pressure ulcer on sacrum, has been repositioned for comfort on stretcher. Pt alert & oriented, moans periodically.

## 2019-03-10 NOTE — ED Provider Notes (Signed)
[facility] Emergency Department Provider Note  ____________________________________________  Time seen: Approximately 2:36 PM  I have reviewed the triage vital signs and the nursing notes.   HISTORY  Chief Complaint GI Bleeding    HPI Debra Butler is a 85 y.o. female with history of diabetes hypertension and IBS who comes to the ED due to a large amount of rectal bleeding yesterday.  Today she is feeling generalized weakness as well as reporting increased pain from her sacral decubitus wound.  She sees [facility] wound [facility] for this and has another appointment with them in about 11 days.  She lives at independent living, where the staff help her with daily dressing changes.  Denies fever or body aches.  No shortness of breath.  No chest pain.  Symptoms today are constant, no aggravating or alleviating factors.      Past Medical History:  Diagnosis Date  . Arthritis   . Blood clot in vein 2013   left leg  . Diabetes mellitus without complication ([place]) 0000000  . Hypertension 1988  . IBS (irritable bowel syndrome)   . Myocardial infarction [facility])      Patient Active Problem List   Diagnosis Date Noted  . Venous ulcer of left leg ([place]) 07/09/2018  . Venous ulcer of right leg ([place]) 06/10/2018  . Lower extremity pain, bilateral 11/20/2017  . Blister 10/31/2017  . Chest pain 09/06/2016  . Lower extremity edema 07/31/2016  . Diabetes ([place]) 04/03/2016  . Lymphedema 04/03/2016  . Swelling of limb 04/03/2016  . Hypertension 05/29/1986     Past Surgical History:  Procedure Laterality Date  . ABDOMINAL HYSTERECTOMY  age 12   partial, still has ovaries and tubes  . APPENDECTOMY    . BACK SURGERY     screws and titanium  . BREAST BIOPSY     at age 64, benign  . CARPAL TUNNEL RELEASE Right 2010  . CARPAL TUNNEL RELEASE Left 07/25/2017   Procedure: CARPAL TUNNEL RELEASE;  Surgeon: Earnestine Leys, MD;  Location: [facility] ORS;  Service: Orthopedics;   Laterality: Left;  . CHOLECYSTECTOMY    . COLON SURGERY  1984  . COLONOSCOPY  2007, 2016   Dr Jamal Collin  . FOOT SURGERY    . FRACTURE SURGERY  2010   hip  . FRACTURE SURGERY  2009   femur  . JOINT REPLACEMENT     both knee  . PACEMAKER PLACEMENT  2013  . TONSILLECTOMY    . UPPER GI ENDOSCOPY  2016   Dr Jamal Collin     Prior to Admission medications   Medication Sig Start Date End Date Taking? Authorizing Provider  aspirin 81 MG tablet Take 81 mg by mouth daily.   Yes [provider]  cholecalciferol (VITAMIN D) 1000 units tablet Take 1,000 Units by mouth daily.   Yes [provider]  clorazepate (TRANXENE) 3.75 MG tablet Take 3.75 mg by mouth daily. 12/20/18  Yes [provider]  cyanocobalamin (,VITAMIN B-12,) 1000 MCG/ML injection Inject 1,000 mcg into the muscle every 30 (thirty) days.   Yes [provider]  diltiazem (DILACOR XR) 180 MG 24 hr capsule Take 180 mg by mouth daily.   Yes [provider]  furosemide (LASIX) 20 MG tablet Take 40 mg by mouth 2 (two) times daily.   Yes [provider]  glipiZIDE (GLUCOTROL XL) 5 MG 24 hr tablet Take 5 mg by mouth daily.   Yes [provider]  levothyroxine ([place]) 112  MCG tablet Take 112 mcg by mouth daily before breakfast.   Yes [provider]  metFORMIN (GLUCOPHAGE) 500 MG tablet Take 500 mg by mouth 2 (two) times daily with a meal.   Yes [provider]  potassium chloride SA (K-DUR,KLOR-CON) 20 MEQ tablet Take 20 mEq by mouth 2 (two) times daily.   Yes [provider]  pregabalin (LYRICA) 100 MG capsule Take 100 mg by mouth 2 (two) times daily.   Yes [provider]  warfarin (COUMADIN) 5 MG tablet Take 5 mg by mouth daily with supper.    Yes [provider]  nitroGLYCERIN (NITROSTAT) 0.4 MG SL tablet Place 0.4 mg under the tongue every 5 (five) minutes as needed for chest pain.    [provider]   Pramox-PE-Glycerin-Petrolatum (PREPARATION H) 1-0.25-14.4-15 % CREA Place 1 application rectally 3 (three) times daily as needed (for rectal discomfort/hemmorroids.).    [provider]     Allergies Sulphur [sulfur], Ammonia, and Phenergan [promethazine]   History reviewed. No pertinent family history.  Social History Social History   Tobacco Use  . Smoking status: Never Smoker  . Smokeless tobacco: Never Used  Substance Use Topics  . Alcohol use: No  . Drug use: No    Review of Systems  Constitutional:   No fever or chills.  ENT:   No sore throat. No rhinorrhea. Cardiovascular:   No chest pain or syncope. Respiratory:   No dyspnea or cough. Gastrointestinal:   Negative for abdominal pain, vomiting and diarrhea.  Musculoskeletal:   Positive low back pain from sacral decubitus wound All other systems reviewed and are negative except as documented above in ROS and HPI.  ____________________________________________   PHYSICAL EXAM:  VITAL SIGNS: ED Triage Vitals  Enc Vitals Group     BP 03/10/19 1146 122/69     Pulse Rate 03/10/19 1146 89     Resp 03/10/19 1146 (!) 23     Temp 03/10/19 1146 98.9 F (37.2 C)     Temp Source 03/10/19 1146 Oral     SpO2 03/10/19 1146 92 %     Weight 03/10/19 1147 201 lb 1 oz (91.2 kg)     Height 03/10/19 1147 5\' 4"  (1.626 m)     Head Circumference --      Peak Flow --      Pain Score 03/10/19 1147 0     Pain Loc --      Pain Edu? --      Excl. in [place]? --     Vital signs reviewed, nursing assessments reviewed.   Constitutional:   Alert and oriented.  Ill-appearing Eyes:   Conjunctivae are normal. EOMI. PERRL. ENT      Head:   Normocephalic and atraumatic.      Nose:   Wearing a mask.      Mouth/Throat:   Wearing a mask.      Neck:   No meningismus. Full ROM. Hematological/Lymphatic/Immunilogical:   No cervical lymphadenopathy. Cardiovascular:   Irregularly irregular rhythm, heart rate of about 80. Symmetric  bilateral radial and DP pulses.  No murmurs. Cap refill less than 2 seconds. Respiratory:   Normal respiratory effort without tachypnea/retractions. Breath sounds are clear and equal bilaterally. No wheezes/rales/rhonchi. Gastrointestinal:   Soft with left lower quadrant tenderness.  Non distended. There is no CVA tenderness.  No rebound, rigidity, or guarding.  Rectal exam performed with nurse [name] at bedside, brown stool, Hemoccult negative Genitourinary:   deferred Musculoskeletal:  Normal range of motion in all extremities. No joint effusions.  No lower extremity tenderness.  No edema.  Large sacral decubitus wound with large amount of purulent drainage, foul odor, necrotic tissue that is dehiscing within the wound exposing ulceration down through the muscle layer. Neurologic:   Normal speech and language.  Motor grossly intact. No acute focal neurologic deficits are appreciated.  Skin:    Skin is warm, dry with sacral decub wound as above. No rash noted.  No petechiae, purpura, or bullae.  ____________________________________________    LABS (pertinent positives/negatives) (all labs ordered are listed, but only abnormal results are displayed) Labs Reviewed  COMPREHENSIVE METABOLIC PANEL - Abnormal; Notable for the following components:      Result Value   Potassium 3.2 (*)    Glucose, Bld 142 (*)    BUN 26 (*)    Creatinine, Ser 1.55 (*)    Calcium 7.3 (*)    Total Protein 5.7 (*)    Albumin 3.1 (*)    Total Bilirubin 1.4 (*)    GFR calc non Af Amer 28 (*)    GFR calc Af Amer 33 (*)    All other components within normal limits  CBC - Abnormal; Notable for the following components:   RBC 2.43 (*)    Hemoglobin 8.5 (*)    HCT 25.9 (*)    MCV 106.6 (*)    MCH 35.0 (*)    RDW 17.9 (*)    nRBC 0.5 (*)    All other components within normal limits  PROTIME-INR - Abnormal; Notable for the following components:   Prothrombin Time 16.0 (*)    INR 1.3 (*)    All other components  within normal limits  SARS CORONAVIRUS 2 (TAT 6-24 HRS)  POC OCCULT BLOOD, ED  TYPE AND SCREEN   ____________________________________________   EKG Interpreted by me Atrial fibrillation rate of 89, normal axis and intervals.  Normal QRS ST segments and T waves.  ____________________________________________    RADIOLOGY  Ct Abdomen Pelvis Wo Contrast  Result Date: 03/10/2019 CLINICAL DATA:  Left lower quadrant abdominal pain, rectal bleeding, sacral decubitus ulcer EXAM: CT ABDOMEN AND PELVIS WITHOUT CONTRAST TECHNIQUE: Multidetector CT imaging of the abdomen and pelvis was performed following the standard protocol without IV contrast. COMPARISON:  10/30/2009, 01/05/2008 FINDINGS: Lower chest: Trace bilateral pleural effusions with associated dependent subsegmental atelectasis. Hepatobiliary: Prior cholecystectomy. Intra and extrahepatic biliary dilatation is similar to prior CT. No focal hepatic lesion. Pancreas: Atrophy of the pancreas without pancreatic ductal dilatation or peripancreatic inflammatory changes. Spleen: Multiple low-density intrasplenic lesions, several of which appear increased in size compared to remote prior studies. Large lesion within the central aspect of the spleen measures 2.1 cm in diameter (series 2, image 15). Adrenals/Urinary Tract: Unremarkable adrenal glands. Large simple left renal cyst unchanged in appearance from prior. There is no hydronephrosis. Ureters and urinary bladder are within normal limits. Stomach/Bowel: Surgical suture in the rectosigmoid region related to prior bowel surgery. Abnormally eccentric thickened appearance of the low rectum (series 2, image 78) with somewhat ill-defined margins. Scattered colonic diverticulosis. Appendix not visualized. No pericolonic inflammatory changes are identified. No dilated loops of bowel. Stomach within normal limits. Vascular/Lymphatic: Aortic atherosclerosis. No enlarged abdominal or pelvic lymph nodes.  Reproductive: Status post hysterectomy. No adnexal masses. Other: No ascites.  No abdominal wall hernia. Musculoskeletal: Mild skin thickening and induration with skin irregularity overlying the left gluteal region (series 2, images 57-59). No underlying fluid collection. Extensive prior lumbar  fusion hardware. Additional ORIF hardware in the proximal left femur. Chronic L2 compression fracture. No acute osseous findings. No cortical destruction or periostitis to suggest osteomyelitis. IMPRESSION: 1. Abnormally thickened appearance of the low rectum which could reflect proctitis. Underlying neoplasm at this site is not excluded. Correlation with digital rectal exam is suggested. Flexible sigmoidoscopy could be considered following the resolution of acute symptoms. 2. Findings of cellulitis with mild skin breakdown over the left gluteal region without underlying fluid collection or osseous abnormality to suggest abscess or osteomyelitis. 3. Multiple nonspecific low-density lesions within the spleen, several of which are new/enlarged compared to remote prior CT 10/30/2009. 4. Trace bilateral pleural effusions. Electronically Signed   By: Davina Poke M.D.   On: 03/10/2019 14:12    ____________________________________________   PROCEDURES Procedures  ____________________________________________  DIFFERENTIAL DIAGNOSIS   GI bleed, diverticulitis, perirectal abscess, intra-abdominal abscess, sacral osteomyelitis, infected decubitus wound  CLINICAL IMPRESSION / ASSESSMENT AND PLAN / ED COURSE  Medications ordered in the ED: Medications  vancomycin (VANCOCIN) IVPB 1000 mg/200 mL premix (1,000 mg Intravenous New Bag/Given 03/10/19 1348)  cefTRIAXone (ROCEPHIN) 2 g in sodium chloride 0.9 % 100 mL IVPB (has no administration in time range)  vancomycin (VANCOCIN) IVPB 1000 mg/200 mL premix (has no administration in time range)    Pertinent labs & imaging results that were available during my care of  the patient were reviewed by me and considered in my medical decision making (see chart for details).  Berit J Stockett was evaluated in Emergency Department on 03/10/2019 for the symptoms described in the history of present illness. She was evaluated in the context of the global COVID-19 pandemic, which necessitated consideration that the patient might be at risk for infection with the SARS-CoV-2 virus that causes COVID-19. Institutional protocols and algorithms that pertain to the evaluation of patients at risk for COVID-19 are in a state of rapid change based on information released by regulatory bodies including the CDC and federal and state organizations. These policies and algorithms were followed during the patient's care in the ED.   Patient presents with malaise and generalized weakness after an episode of rectal bleeding reported yesterday.  Today there is no evidence of active GI bleed, but with her abdominal tenderness and purulent necrotic sacral decub wound, I will obtain a CT scan to further evaluate.  We will need to start the patient on IV antibiotics, and plan for hospitalization for surgical evaluation.  Clinical Course as of Mar 10 1435  Mon Mar 10, 2019  1309 Inr normal  INR(!): 1.3 [PS]  1416 CT suggestive of proctitis as well as some cellulitis surrounding her sacral wound.  Antibiotics ordered.  Discussed with hospitalist for further management.   [PS]    Clinical Course User Index [PS] Carrie Mew, MD     ____________________________________________   FINAL CLINICAL IMPRESSION(S) / ED DIAGNOSES    Final diagnoses:  Pressure injury of sacral region, stage 3 [facility])  Atrial fibrillation, unspecified type [facility])  Rectal bleeding     ED Discharge Orders    None      Portions of this note were generated with dragon dictation software. Dictation errors may occur despite best attempts at proofreading.   Carrie Mew, MD 03/10/19 [phone]

## 2019-03-10 NOTE — ED Notes (Signed)
Debra Butler is poa; please call if she is needed. [phone]

## 2019-03-10 NOTE — H&P (Signed)
[facility] NAME: Debra Butler    MR#:  [id number]  DATE OF BIRTH:  [birth year]  DATE OF ADMISSION:  03/10/2019  PRIMARY CARE PHYSICIAN: Lenard Simmer, MD   REQUESTING/REFERRING PHYSICIAN: Brenton Grills, MD  CHIEF COMPLAINT:   Chief Complaint  Patient presents with   GI Bleeding    HISTORY OF PRESENT ILLNESS:  85 y.o. female with pertinent past medical history of diabetes mellitus, hypertension, IBS, MI, bilateral lower extremity lymphedema, venous stasis ulcers of bilateral lower legs, traumatic hematoma of buttock now with sacral abscess presenting to the ED due to a large amount of rectal bleeding and pain in her sacral decubitus wound.  Patient is from [facility] assisted [facility], complain today of not feeling well. Per facility, she had "rectal bleed that was significant yesterday " but none noted today.  She does have decubitus sacral ulcer that is painful.  Patient describes symptoms of pain at the buttock and tailbone with bloody drainage. She does report pain worse with changing dressing, changing position, pressure, movement and sitting. Nothing seems to make pain better.  Patient is followed by [facility] wound management and was last seen on 02/19/2019 for dressing changes.  Patient was brought into the ED today due to worsening pain in her sacral area and generalized fatigue.  On arrival to the ED, she was afebrile with blood pressure122/69 mm Hg and pulse rate 89 beats/min. There were no focal neurological deficits; she was alert and oriented x4, and she did not demonstrate any memory deficits.  Initial labs revealed potassium 3.2, BUN 26 creatinine 1.55, 1.4,wbc 9.5, hemoglobin 8.5 dropped from 9.7, MCV 106.6, hematocrit 25.6.  CT abdomen and pelvis shows possible cellulitis with mild scale breakdown over the left gluteal region.  Hospitalist asked to admit for further management.  PAST MEDICAL HISTORY:    Past Medical History:  Diagnosis Date   Arthritis    Blood clot in vein 2013   left leg   Diabetes mellitus without complication ([place]) 0000000   Hypertension 1988   IBS (irritable bowel syndrome)    Myocardial infarction ([place])     PAST SURGICAL HISTORY:   Past Surgical History:  Procedure Laterality Date   ABDOMINAL HYSTERECTOMY  age 12   partial, still has ovaries and tubes   APPENDECTOMY     BACK SURGERY     screws and titanium   BREAST BIOPSY     at age 89, benign   CARPAL TUNNEL RELEASE Right 2010   CARPAL TUNNEL RELEASE Left 07/25/2017   Procedure: CARPAL TUNNEL RELEASE;  Surgeon: Earnestine Leys, MD;  Location: [facility] ORS;  Service: Orthopedics;  Laterality: Left;   [place]   COLONOSCOPY  2007, 2016   Dr Jamal Collin   FOOT SURGERY     FRACTURE SURGERY  2010   hip   FRACTURE SURGERY  2009   femur   JOINT REPLACEMENT     both knee   PACEMAKER PLACEMENT  2013   TONSILLECTOMY     UPPER GI ENDOSCOPY  2016   Dr Jamal Collin    SOCIAL HISTORY:   Social History   Tobacco Use   Smoking status: Never Smoker   Smokeless tobacco: Never Used  Substance Use Topics   Alcohol use: No    FAMILY HISTORY:  History reviewed. No pertinent family history.  DRUG ALLERGIES:   Allergies  Allergen Reactions   Sulphur [  Sulfur] Hives    And seizure   Ammonia    Phenergan [Promethazine] Other (See Comments)    Altered mental status    REVIEW OF SYSTEMS:   Review of Systems  Constitutional: Positive for malaise/fatigue. Negative for chills, fever and weight loss.  HENT: Negative for congestion, hearing loss and sore throat.   Eyes: Negative for blurred vision and double vision.  Respiratory: Negative for cough, shortness of breath and wheezing.   Cardiovascular: Negative for chest pain, palpitations, orthopnea and leg swelling.  Gastrointestinal: Negative for abdominal pain, diarrhea, nausea and vomiting.   Genitourinary: Negative for dysuria and urgency.  Musculoskeletal: Positive for back pain, falls and joint pain. Negative for myalgias.  Skin: Negative for rash.  Neurological: Negative for dizziness, sensory change, speech change, focal weakness and headaches.  Psychiatric/Behavioral: Negative for depression.   MEDICATIONS AT HOME:   Prior to Admission medications   Medication Sig Start Date End Date Taking? Authorizing Provider  aspirin 81 MG tablet Take 81 mg by mouth daily.   Yes [provider]  cholecalciferol (VITAMIN D) 1000 units tablet Take 1,000 Units by mouth daily.   Yes [provider]  clorazepate (TRANXENE) 3.75 MG tablet Take 3.75 mg by mouth daily. 12/20/18  Yes [provider]  cyanocobalamin (,VITAMIN B-12,) 1000 MCG/ML injection Inject 1,000 mcg into the muscle every 30 (thirty) days.   Yes [provider]  diltiazem (DILACOR XR) 180 MG 24 hr capsule Take 180 mg by mouth daily.   Yes [provider]  furosemide (LASIX) 20 MG tablet Take 40 mg by mouth 2 (two) times daily.   Yes [provider]  glipiZIDE (GLUCOTROL XL) 5 MG 24 hr tablet Take 5 mg by mouth daily.   Yes [provider]  levothyroxine (SYNTHROID, LEVOTHROID) 112 MCG tablet Take 112 mcg by mouth daily before breakfast.   Yes [provider]  metFORMIN (GLUCOPHAGE) 500 MG tablet Take 500 mg by mouth 2 (two) times daily with a meal.   Yes [provider]  potassium chloride SA (K-DUR,KLOR-CON) 20 MEQ tablet Take 20 mEq by mouth 2 (two) times daily.   Yes [provider]  pregabalin (LYRICA) 100 MG capsule Take 100 mg by mouth 2 (two) times daily.   Yes [provider]  warfarin (COUMADIN) 5 MG tablet Take 5 mg by mouth daily with supper.    Yes [provider]  nitroGLYCERIN (NITROSTAT) 0.4 MG SL tablet Place 0.4 mg under the tongue every 5 (five) minutes as needed for chest pain.    [provider]  Pramox-PE-Glycerin-Petrolatum (PREPARATION H) 1-0.25-14.4-15 % CREA Place 1 application rectally 3 (three) times daily as needed (for rectal discomfort/hemmorroids.).    [provider]      VITAL SIGNS:  Blood pressure 139/81, pulse (!) 102, temperature 98.9 F (37.2 C), temperature source Oral, resp. rate (!) 23, height 5\' 4"  (1.626 m), weight 91.2 kg, SpO2 100 %.  PHYSICAL EXAMINATION:   Physical Exam  GENERAL:  85 y.o.-year-old patient lying in the bed with no acute distress.  EYES: Pupils equal, round, reactive to light and accommodation. No scleral icterus. Extraocular muscles intact.  HEENT: Head atraumatic, normocephalic. Oropharynx and nasopharynx clear.  NECK:  Supple, no jugular venous distention. No thyroid enlargement, no tenderness.  LUNGS: Normal breath sounds bilaterally, no wheezing, rales,rhonchi or crepitation. No use of accessory muscles of respiration.  CARDIOVASCULAR: S1, S2 normal. No murmurs, rubs, or gallops.  ABDOMEN: Soft, nontender, nondistended. Bowel  sounds present. No organomegaly or mass.  EXTREMITIES: No pedal edema, cyanosis, or clubbing.  NEUROLOGIC: Cranial nerves II through XII are intact. Muscle strength 5/5 in all extremities. Sensation intact. Gait not checked.  PSYCHIATRIC: The patient is alert and oriented x 3.  SKIN: Multiple pressure ulcers, venous statis and skin breakdown     DATA REVIEWED:  LABORATORY PANEL:   CBC Recent Labs  Lab 03/10/19 1204  WBC 9.4  HGB 8.5*  HCT 25.9*  PLT 172   ------------------------------------------------------------------------------------------------------------------  Chemistries  Recent Labs  Lab 03/10/19 1204  NA 136  K 3.2*  CL 102  CO2 27  GLUCOSE 142*  BUN 26*  CREATININE 1.55*  CALCIUM 7.3*  AST 19  ALT 7  ALKPHOS 51  BILITOT 1.4*   ------------------------------------------------------------------------------------------------------------------  Cardiac  Enzymes No results for input(s): TROPONINI in the last 168 hours. ------------------------------------------------------------------------------------------------------------------  RADIOLOGY:  Ct Abdomen Pelvis Wo Contrast  Result Date: 03/10/2019 CLINICAL DATA:  Left lower quadrant abdominal pain, rectal bleeding, sacral decubitus ulcer EXAM: CT ABDOMEN AND PELVIS WITHOUT CONTRAST TECHNIQUE: Multidetector CT imaging of the abdomen and pelvis was performed following the standard protocol without IV contrast. COMPARISON:  10/30/2009, 01/05/2008 FINDINGS: Lower chest: Trace bilateral pleural effusions with associated dependent subsegmental atelectasis. Hepatobiliary: Prior cholecystectomy. Intra and extrahepatic biliary dilatation is similar to prior CT. No focal hepatic lesion. Pancreas: Atrophy of the pancreas without pancreatic ductal dilatation or peripancreatic inflammatory changes. Spleen: Multiple low-density intrasplenic lesions, several of which appear increased in size compared to remote prior studies. Large lesion within the central aspect of the spleen measures 2.1 cm in diameter (series 2, image 15). Adrenals/Urinary Tract: Unremarkable adrenal glands. Large simple left renal cyst unchanged in appearance from prior. There is no hydronephrosis. Ureters and urinary bladder are within normal limits. Stomach/Bowel: Surgical suture in the rectosigmoid region related to prior bowel surgery. Abnormally eccentric thickened appearance of the low rectum (series 2, image 78) with somewhat ill-defined margins. Scattered colonic diverticulosis. Appendix not visualized. No pericolonic inflammatory changes are identified. No dilated loops of bowel. Stomach within normal limits. Vascular/Lymphatic: Aortic atherosclerosis. No enlarged abdominal or pelvic lymph nodes. Reproductive: Status post hysterectomy. No adnexal masses. Other: No ascites.  No abdominal wall hernia. Musculoskeletal: Mild skin thickening and  induration with skin irregularity overlying the left gluteal region (series 2, images 57-59). No underlying fluid collection. Extensive prior lumbar fusion hardware. Additional ORIF hardware in the proximal left femur. Chronic L2 compression fracture. No acute osseous findings. No cortical destruction or periostitis to suggest osteomyelitis. IMPRESSION: 1. Abnormally thickened appearance of the low rectum which could reflect proctitis. Underlying neoplasm at this site is not excluded. Correlation with digital rectal exam is suggested. Flexible sigmoidoscopy could be considered following the resolution of acute symptoms. 2. Findings of cellulitis with mild skin breakdown over the left gluteal region without underlying fluid collection or osseous abnormality to suggest abscess or osteomyelitis. 3. Multiple nonspecific low-density lesions within the spleen, several of which are new/enlarged compared to remote prior CT 10/30/2009. 4. Trace bilateral pleural effusions. Electronically Signed   By: Davina Poke M.D.   On: 03/10/2019 14:12    EKG:  EKG: unchanged from previous tracings, atrial fibrillation, rate 112.  IMPRESSION AND PLAN:   85 y.o. female  with pertinent past medical history of diabetes mellitus, hypertension, atrial fibrillation, IBS, MI, bilateral lower extremity lymphedema, venous stasis ulcers of bilateral lower legs, traumatic hematoma of buttock now with sacral abscess presenting to the ED due to a large  amount of rectal bleeding and pain in her sacral decubitus wound.  1. Anemia due to blood loss from sacral decubitus ulcers - No other sources of bleeding identified - Admit to MedSurg unit - Hemoglobin drop 9.7-8.5 - H&H monitoring q6h - Hold NSAIDs, steroids, ASA - Hold Coumadin for now may need to restart if hemoglobin stable  2. Left sacral decubitus ulcers with necrosis - CT suggests proctitis as well as some cellulitis surrounding his sacral wound - Blood cultures  pending - Start empiric antibiotics - Vascular surgery consult.  Discussed with Dr. Lucky Cowboy for possible debridement and wound VAC placement on Wednesday.  3. Paroxysmal atrial fibrillation-rate controlled on diltiazem - Hold Coumadin for now may need to restart in a.m. if hemoglobin stable  4. Hypokalemia - Replete with IV potassium - Check mag - Recheck potassium levels  5. Diabetes mellitus - Hold glipizide and Metformin - SSI  6. DVT prophylaxis - Hold anti-coagulation for suspect bleeding  All the records are reviewed and case discussed with ED provider. Management plans discussed with the patient, family and they are in agreement.  CODE STATUS: FULL  TOTAL TIME TAKING CARE OF THIS PATIENT: 50 minutes.    on 03/10/2019 at 10:57 PM   Rufina Falco, DNP, CCRN, FNP-BC [facility] Nurse Practitioner Between 7am to 6pm - Pager [phone]  After 6pm go to [email or web address] - password [email or web address] [facility]  Office  [phone]  CC: Primary care physician; Lenard Simmer, MD

## 2019-03-11 LAB — CBC WITH DIFFERENTIAL/PLATELET
Abs Immature Granulocytes: 0.84 10*3/uL — ABNORMAL HIGH (ref 0.00–0.07)
Abs Immature Granulocytes: 0.79 10*3/uL — ABNORMAL HIGH (ref 0.00–0.07)
Basophils Absolute: 0.1 10*3/uL (ref 0.0–0.1)
Basophils Absolute: 0.1 10*3/uL (ref 0.0–0.1)
Basophils Relative: 1 %
Basophils Relative: 1 %
Eosinophils Absolute: 0 10*3/uL (ref 0.0–0.5)
Eosinophils Absolute: 0 10*3/uL (ref 0.0–0.5)
Eosinophils Relative: 0 %
Eosinophils Relative: 0 %
HCT: 26.8 % — ABNORMAL LOW (ref 36.0–46.0)
HCT: 25.3 % — ABNORMAL LOW (ref 36.0–46.0)
Hemoglobin: 8.6 g/dL — ABNORMAL LOW (ref 12.0–15.0)
Hemoglobin: 8.3 g/dL — ABNORMAL LOW (ref 12.0–15.0)
Immature Granulocytes: 8 %
Immature Granulocytes: 7 %
Lymphocytes Relative: 7 %
Lymphocytes Relative: 7 %
Lymphs Abs: 0.8 10*3/uL (ref 0.7–4.0)
Lymphs Abs: 0.7 10*3/uL (ref 0.7–4.0)
MCH: 34.7 pg — ABNORMAL HIGH (ref 26.0–34.0)
MCH: 34.9 pg — ABNORMAL HIGH (ref 26.0–34.0)
MCHC: 32.1 g/dL (ref 30.0–36.0)
MCHC: 32.8 g/dL (ref 30.0–36.0)
MCV: 108.1 fL — ABNORMAL HIGH (ref 80.0–100.0)
MCV: 106.3 fL — ABNORMAL HIGH (ref 80.0–100.0)
Monocytes Absolute: 1.2 10*3/uL — ABNORMAL HIGH (ref 0.1–1.0)
Monocytes Absolute: 1.5 10*3/uL — ABNORMAL HIGH (ref 0.1–1.0)
Monocytes Relative: 11 %
Monocytes Relative: 14 %
Neutro Abs: 8.3 10*3/uL — ABNORMAL HIGH (ref 1.7–7.7)
Neutro Abs: 8.1 10*3/uL — ABNORMAL HIGH (ref 1.7–7.7)
Neutrophils Relative %: 73 %
Neutrophils Relative %: 71 %
Platelets: 200 10*3/uL (ref 150–400)
Platelets: 182 10*3/uL (ref 150–400)
RBC: 2.48 MIL/uL — ABNORMAL LOW (ref 3.87–5.11)
RBC: 2.38 MIL/uL — ABNORMAL LOW (ref 3.87–5.11)
RDW: 17.4 % — ABNORMAL HIGH (ref 11.5–15.5)
RDW: 17.5 % — ABNORMAL HIGH (ref 11.5–15.5)
Smear Review: NORMAL
WBC: 11.2 10*3/uL — ABNORMAL HIGH (ref 4.0–10.5)
WBC: 11.3 10*3/uL — ABNORMAL HIGH (ref 4.0–10.5)
nRBC: 0.3 % — ABNORMAL HIGH (ref 0.0–0.2)
nRBC: 0.3 % — ABNORMAL HIGH (ref 0.0–0.2)

## 2019-03-11 LAB — BASIC METABOLIC PANEL
Anion gap: 16 — ABNORMAL HIGH (ref 5–15)
Anion gap: 13 (ref 5–15)
Anion gap: 15 (ref 5–15)
BUN: 28 mg/dL — ABNORMAL HIGH (ref 8–23)
BUN: 28 mg/dL — ABNORMAL HIGH (ref 8–23)
BUN: 27 mg/dL — ABNORMAL HIGH (ref 8–23)
CO2: 27 mmol/L (ref 22–32)
CO2: 29 mmol/L (ref 22–32)
CO2: 27 mmol/L (ref 22–32)
Calcium: 8.3 mg/dL — ABNORMAL LOW (ref 8.9–10.3)
Calcium: 8.1 mg/dL — ABNORMAL LOW (ref 8.9–10.3)
Calcium: 8.2 mg/dL — ABNORMAL LOW (ref 8.9–10.3)
Chloride: 95 mmol/L — ABNORMAL LOW (ref 98–111)
Chloride: 96 mmol/L — ABNORMAL LOW (ref 98–111)
Chloride: 96 mmol/L — ABNORMAL LOW (ref 98–111)
Creatinine, Ser: 1.69 mg/dL — ABNORMAL HIGH (ref 0.44–1.00)
Creatinine, Ser: 1.72 mg/dL — ABNORMAL HIGH (ref 0.44–1.00)
Creatinine, Ser: 1.74 mg/dL — ABNORMAL HIGH (ref 0.44–1.00)
GFR calc Af Amer: 29 mL/min — ABNORMAL LOW (ref >60)
GFR calc Af Amer: 29 mL/min — ABNORMAL LOW (ref >60)
GFR calc Af Amer: 28 mL/min — ABNORMAL LOW (ref >60)
GFR calc non Af Amer: 25 mL/min — ABNORMAL LOW (ref >60)
GFR calc non Af Amer: 25 mL/min — ABNORMAL LOW (ref >60)
GFR calc non Af Amer: 24 mL/min — ABNORMAL LOW (ref >60)
Glucose, Bld: 235 mg/dL — ABNORMAL HIGH (ref 70–99)
Glucose, Bld: 223 mg/dL — ABNORMAL HIGH (ref 70–99)
Glucose, Bld: 171 mg/dL — ABNORMAL HIGH (ref 70–99)
Potassium: 2.9 mmol/L — ABNORMAL LOW (ref 3.5–5.1)
Potassium: 2.7 mmol/L — CL (ref 3.5–5.1)
Potassium: 3.1 mmol/L — ABNORMAL LOW (ref 3.5–5.1)
Sodium: 138 mmol/L (ref 135–145)
Sodium: 138 mmol/L (ref 135–145)
Sodium: 138 mmol/L (ref 135–145)

## 2019-03-11 LAB — CBC
HCT: 26.1 % — ABNORMAL LOW (ref 36.0–46.0)
Hemoglobin: 8.4 g/dL — ABNORMAL LOW (ref 12.0–15.0)
MCH: 34.9 pg — ABNORMAL HIGH (ref 26.0–34.0)
MCHC: 32.2 g/dL (ref 30.0–36.0)
MCV: 108.3 fL — ABNORMAL HIGH (ref 80.0–100.0)
Platelets: 170 10*3/uL (ref 150–400)
RBC: 2.41 MIL/uL — ABNORMAL LOW (ref 3.87–5.11)
RDW: 17.2 % — ABNORMAL HIGH (ref 11.5–15.5)
WBC: 10.3 10*3/uL (ref 4.0–10.5)
nRBC: 0.2 % (ref 0.0–0.2)

## 2019-03-11 LAB — GLUCOSE, CAPILLARY
Glucose-Capillary: 231 mg/dL — ABNORMAL HIGH (ref 70–99)
Glucose-Capillary: 213 mg/dL — ABNORMAL HIGH (ref 70–99)
Glucose-Capillary: 158 mg/dL — ABNORMAL HIGH (ref 70–99)
Glucose-Capillary: 151 mg/dL — ABNORMAL HIGH (ref 70–99)
Glucose-Capillary: 155 mg/dL — ABNORMAL HIGH (ref 70–99)
Glucose-Capillary: 138 mg/dL — ABNORMAL HIGH (ref 70–99)

## 2019-03-11 LAB — PREALBUMIN: Prealbumin: 10.3 mg/dL — ABNORMAL LOW (ref 18–38)

## 2019-03-11 LAB — C-REACTIVE PROTEIN: CRP: 13.4 mg/dL — ABNORMAL HIGH (ref <1.0)

## 2019-03-11 LAB — HEMOGLOBIN A1C
Hgb A1c MFr Bld: 6.7 % — ABNORMAL HIGH (ref 4.8–5.6)
Mean Plasma Glucose: 145.59 mg/dL

## 2019-03-11 LAB — VANCOMYCIN, RANDOM: Vancomycin Rm: 17

## 2019-03-11 LAB — HIV ANTIBODY (ROUTINE TESTING W REFLEX): HIV Screen 4th Generation wRfx: NONREACTIVE

## 2019-03-11 LAB — MRSA PCR SCREENING: MRSA by PCR: NEGATIVE

## 2019-03-11 LAB — SARS CORONAVIRUS 2 (TAT 6-24 HRS): SARS Coronavirus 2: NEGATIVE

## 2019-03-11 LAB — SEDIMENTATION RATE: Sed Rate: 107 mm/hr — ABNORMAL HIGH (ref 0–30)

## 2019-03-11 LAB — MAGNESIUM: Magnesium: 1.6 mg/dL — ABNORMAL LOW (ref 1.7–2.4)

## 2019-03-11 MED ORDER — ADULT MULTIVITAMIN W/MINERALS CH: 1.0000 | ORAL_TABLET | Freq: Every day | ORAL | Status: DC

## 2019-03-11 MED ORDER — METRONIDAZOLE IVPB CUSTOM
250.0000 mg | Freq: Three times a day (TID) | INTRAVENOUS | Status: DC
  Administered 2019-03-11 – 2019-03-12 (×2): 250 mg via INTRAVENOUS
  Filled 2019-03-11 (×4): qty 50

## 2019-03-11 MED ORDER — MAGNESIUM SULFATE 2 GM/50ML IV SOLN
2.0000 g | Freq: Once | INTRAVENOUS | Status: AC
  Administered 2019-03-11: 2 g via INTRAVENOUS
  Filled 2019-03-11: qty 50

## 2019-03-11 MED ORDER — DILTIAZEM HCL ER COATED BEADS 240 MG PO CP24
240.0000 mg | ORAL_CAPSULE | Freq: Every day | ORAL | Status: DC
  Administered 2019-03-11 – 2019-03-15 (×2): 240 mg via ORAL
  Filled 2019-03-11 (×3): qty 1

## 2019-03-11 MED ORDER — PREGABALIN 75 MG PO CAPS
75.0000 mg | ORAL_CAPSULE | Freq: Two times a day (BID) | ORAL | Status: DC
  Administered 2019-03-14 – 2019-03-18 (×4): 75 mg via ORAL
  Filled 2019-03-11 (×5): qty 1

## 2019-03-11 MED ORDER — ENSURE ENLIVE PO LIQD: 237.0000 mL | Freq: Two times a day (BID) | ORAL | Status: DC

## 2019-03-11 MED ORDER — POTASSIUM CHLORIDE 10 MEQ/100ML IV SOLN
10.0000 meq | INTRAVENOUS | Status: AC
  Administered 2019-03-11 (×4): 10 meq via INTRAVENOUS
  Filled 2019-03-11 (×4): qty 100

## 2019-03-11 MED ORDER — VANCOMYCIN VARIABLE DOSE PER UNSTABLE RENAL FUNCTION (PHARMACIST DOSING): Status: DC

## 2019-03-11 MED ORDER — VANCOMYCIN HCL IN DEXTROSE 1-5 GM/200ML-% IV SOLN
1000.0000 mg | Freq: Once | INTRAVENOUS | Status: AC
  Administered 2019-03-11: 1000 mg via INTRAVENOUS
  Filled 2019-03-11: qty 200

## 2019-03-11 MED ORDER — VITAMIN C 500 MG PO TABS
500.0000 mg | ORAL_TABLET | Freq: Two times a day (BID) | ORAL | Status: DC
  Filled 2019-03-11: qty 1

## 2019-03-11 NOTE — Progress Notes (Signed)
Inpatient Diabetes Program Recommendations  AACE/ADA: New Consensus Statement on Inpatient Glycemic Control   Target Ranges:  Prepandial:   less than 140 mg/dL      Peak postprandial:   less than 180 mg/dL (1-2 hours)      Critically ill patients:  140 - 180 mg/dL   Results for RAECHAL, MCEVERS (MRN [id number]) as of 03/11/2019 09:52  Ref. Range 03/11/2019 01:20 03/11/2019 02:41 03/11/2019 07:37  Glucose-Capillary Latest Ref Range: 70 - 99 mg/dL 231 (H) 213 (H) 158 (H)   Review of Glycemic Control  Diabetes history: DM2 Outpatient Diabetes medications: Metformin 500 mg BID, Glipizide XL 5 mg daily Current orders for Inpatient glycemic control: Novolog 0-9 units TID with meals, Novolog 0-5 units QHS  NOTE: Noted consult for diabetes coordinator per wound order set. Chart reviewed. Per H&P, patient is from [facility] assisted living with sacral wound. Patient has DM2 and takes oral DM medications as an outpatient. Fasting glucose 158 mg/dl today. Agree with current orders. Will continue to follow along while inpatient.  Thanks, Barnie Alderman, RN, MSN, CDE Diabetes Coordinator Inpatient Diabetes Program [phone] (Team Pager from 8am to 5pm)

## 2019-03-11 NOTE — Progress Notes (Signed)
Pharmacy Antibiotic Note  Debra Butler is a 85 y.o. female admitted on 03/10/2019 with cellulitis.  Pharmacy has been consulted for Vancomycin dosing.  85yo F w/ elevated SCr  Plan: Vancomycin 1g x 1 given on 10/12 at 1348 Vancomycin 1g x 1 given on 10/13 at 0331 to make 2g total.  Will order random vanc level for 1530 today to assess further doses.  Additional vancomycin doses based on levels / renal fxn SCr ordered for AM to continue to monitor renal function.  Height: 5\' 4"  (162.6 cm) Weight: 194 lb 10.7 oz (88.3 kg) IBW/kg (Calculated) : 54.7  Temp (24hrs), Avg:100 F (37.8 C), Min:98.9 F (37.2 C), Max:100.8 F (38.2 C)  Recent Labs  Lab 03/10/19 1204 03/11/19 0243 03/11/19 0433 03/11/19 0656  WBC 9.4 11.2* 10.3 11.3*  CREATININE 1.55* 1.69* 1.72* 1.74*    Estimated Creatinine Clearance: 20.8 mL/min (A) (by C-G formula based on SCr of 1.74 mg/dL (H)).    Allergies  Allergen Reactions  . Sulphur [Sulfur] Hives    And seizure  . Ammonia   . Phenergan [Promethazine] Other (See Comments)    Altered mental status   Antimicrobials this admission: Rocephin 10/12 >> Flagyl 10/13 >> Vancomycin 10/12 >>   Dose adjustments this admission:   Microbiology results: BCx x2 NGTD MRSA PCR neg WCx pending   Thank you for allowing pharmacy to be a part of this patient's care.  Rocky Morel 03/11/2019 9:05 AM

## 2019-03-11 NOTE — TOC Progression Note (Signed)
Transition of Care [facility]) - Progression Note    Patient Details  Name: Debra Butler MRN: [id number] Date of Birth: 10-19-1921  [facility] CM/SW [place], RN Phone Number: 03/11/2019, 2:14 PM  Clinical Narrative:    Damaris Schooner with [facility] at [facility] she stated that the patient came to them about 5 weeks ago from home due to the patient not being able to care for herself at home, she stated that the patient had several decubitus ulcers when they got her.  She feels that the patient may benefit from rehab for a short time.  She would be happy to accept the patient back when she is ready for assisted living level of care.        Expected Discharge Plan and Services                                                 Social Determinants of Health (SDOH) Interventions    Readmission Risk Interventions No flowsheet data found.

## 2019-03-11 NOTE — Progress Notes (Signed)
Nutrition Follow-up  DOCUMENTATION CODES:   Obesity unspecified  INTERVENTION:   Ensure Enlive po BID, each supplement provides 350 kcal and 20 grams of protein  MVI daily  Vitamin C 500mg  po BID  Magic cup TID with meals, each supplement provides 290 kcal and 9 grams of protein  Dysphagia 3 diet   NUTRITION DIAGNOSIS:   Increased nutrient needs related to wound healing as evidenced by increased estimated needs.  GOAL:   Patient will meet greater than or equal to 90% of their needs  MONITOR:   PO intake, Supplement acceptance, Labs, Weight trends, Skin, I & O's  REASON FOR ASSESSMENT:   Consult Wound healing  ASSESSMENT:   85 y.o. female with pertinent past medical history of diabetes mellitus, hypertension, IBS, MI, bilateral lower extremity lymphedema, venous stasis ulcers of bilateral lower legs, traumatic hematoma of buttock now with sacral abscess presenting to the ED due to a large amount of rectal bleeding and pain in her sacral decubitus wound.  RD working remotely.  Spoke with pt's POA at bedside. She reports pt with decreased appetite and oral intake pta and today. Pt did not eat any breakfast or lunch today but she did eat some ice cream. Pt reports that she is not hungry. Pt reports that she does not want any Ensure but POA feels she can get her to drink some. RD discussed with POA the importance of adequate protein needed for wound healing. RD will add supplements and vitamins to support wound healing. RD will also liberalize pt's diet. Per chart, pt is weight stable pta.   Medications reviewed and include: vitamin D, B12, , insulin, synthroid, metronidazole, vancomycin  Labs reviewed: K 3.1(L), BUN 27(H), creat 1.74(H), Mg 1.6(L), prealbumin 10.3(L) Wbc- 11.3(H), Hgb 8.3(L), Hct 25.3(L), MCV 106.3(H) cbgs- 231, 213, 158, 151 x 24 hrs AIC 6.7(H)- 10/13  Unable to complete Nutrition-Focused physical exam at this time.   Diet Order:   Diet Order        Diet heart healthy/carb modified Room service appropriate? Yes; Fluid consistency: Thin  Diet effective now             EDUCATION NEEDS:   Education needs have been addressed  Skin:  Skin Assessment: Reviewed RN Assessment(ecchymosis, Stage IV sacral wound)  Last BM:  10/13- type 6  Height:   Ht Readings from Last 1 Encounters:  03/11/19 5\' 4"  (1.626 m)    Weight:   Wt Readings from Last 1 Encounters:  03/11/19 88.3 kg    Ideal Body Weight:  54.5 kg  BMI:  Body mass index is 33.41 kg/m.  Estimated Nutritional Needs:   Kcal:  1600-1800kcal/day  Protein:  80-90g/day  Fluid:  1.6L/day  Koleen Distance MS, RD, LDN Pager #- [phone] Office#- [phone] After Hours Pager: [phone]

## 2019-03-11 NOTE — Consult Note (Signed)
[facility] Consult Note  MRN : [id number]  Debra Butler is a 85 y.o. (10/20/1921) female who presents with chief complaint of  Chief Complaint  Patient presents with   GI Bleeding   History of Present Illness: The patient is a 85 year old female with multiple medical issues (see below) well-known to our clinic as we have treated her bilateral lower extremity lymphedema / ulcerations for many years.  Patient presented to the [facility] emergency department yesterday for progressively worsening weakness and possible rectal bleeding.  On upon arrival to the patient's room, the patient's POA was present.  The patient was very lethargic.  Patient has been going to [place] [facility] for left gluteal wound care.  As per POA, wound does not seem to be healing.  Patient with progressively worsening pain at this site as well.  Patient with possible rectal bleed.  Had "significant" amount of blood per rectum 2 days ago.  No continued bleeding in the ED or during her inpatient stay.  Patient denies any fever, nausea or vomiting.  Patient denies any chest pain or shortness of breath.  Vascular surgery was consulted by Stark Klein, NP for left buttock wound. Current Facility-Administered Medications  Medication Dose Route Frequency Provider Last Rate Last Dose   0.9 %  sodium chloride infusion   Intravenous Continuous Lang Snow, NP 50 mL/hr at 03/11/19 [id number]     cefTRIAXone (ROCEPHIN) 2 g in sodium chloride 0.9 % 100 mL IVPB  2 g Intravenous Q24H Lang Snow, NP       cholecalciferol (VITAMIN D) tablet 1,000 Units  1,000 Units Oral Daily Lang Snow, NP   1,000 Units at 03/11/19 1033   clorazepate (TRANXENE) tablet 3.75 mg  3.75 mg Oral Daily Lang Snow, NP   3.75 mg at 03/11/19 1034   [START ON 03/28/2019] cyanocobalamin ((VITAMIN B-12)) injection 1,000 mcg  1,000 mcg Intramuscular Q30 days Lang Snow, NP       diltiazem (CARDIZEM CD) 24 hr capsule 240 mg  240 mg Oral Daily [name], MD   240 mg at 03/11/19 1034   insulin aspart (novoLOG) injection 0-5 Units  0-5 Units Subcutaneous QHS Lang Snow, NP   2 Units at 03/11/19 0315   insulin aspart (novoLOG) injection 0-9 Units  0-9 Units Subcutaneous TID WC Lang Snow, NP   2 Units at 03/11/19 1200   levothyroxine (SYNTHROID) tablet 112 mcg  112 mcg Oral QAC breakfast Lang Snow, NP   112 mcg at 03/11/19 0557   metroNIDAZOLE (FLAGYL) tablet 500 mg  500 mg Oral Q8H Lang Snow, NP   500 mg at 03/11/19 0557   nitroGLYCERIN (NITROSTAT) SL tablet 0.4 mg  0.4 mg Sublingual Q5 min PRN Lang Snow, NP       phenylephrine-shark liver oil-mineral oil-petrolatum (PREPARATION H) rectal ointment 1 application  1 application Rectal TID PRN Lang Snow, NP       pregabalin (LYRICA) capsule 100 mg  100 mg Oral BID Lang Snow, NP   100 mg at 03/11/19 1033   vancomycin variable dose per unstable renal function (pharmacist dosing)   Does not apply See admin instructions Ena Dawley [facility]       Past Medical History:  Diagnosis Date   Arthritis    Blood clot in vein 2013   left leg   Diabetes mellitus without complication ([place]) 0000000   Hypertension 1988  IBS (irritable bowel syndrome)    Myocardial infarction [facility])    Past Surgical History:  Procedure Laterality Date   ABDOMINAL HYSTERECTOMY  age 89   partial, still has ovaries and tubes   APPENDECTOMY     BACK SURGERY     screws and titanium   BREAST BIOPSY     at age 35, benign   CARPAL TUNNEL RELEASE Right 2010   CARPAL TUNNEL RELEASE Left 07/25/2017   Procedure: CARPAL TUNNEL RELEASE;  Surgeon: Earnestine Leys, MD;  Location: [facility] ORS;  Service: Orthopedics;  Laterality: Left;   CHOLECYSTECTOMY     COLON SURGERY  1984   COLONOSCOPY  2007, 2016   Dr Jamal Collin     FOOT SURGERY     FRACTURE SURGERY  2010   hip   FRACTURE SURGERY  2009   femur   JOINT REPLACEMENT     both knee   PACEMAKER PLACEMENT  2013   TONSILLECTOMY     UPPER GI ENDOSCOPY  2016   Dr Jamal Collin   Social History Social History   Tobacco Use   Smoking status: Never Smoker   Smokeless tobacco: Never Used  Substance Use Topics   Alcohol use: No   Drug use: No   Family History History reviewed. No pertinent family history.  Denies family history of peripheral artery disease, venous disease or renal disease.  Allergies  Allergen Reactions   Sulphur [Sulfur] Hives    And seizure   Ammonia    Phenergan [Promethazine] Other (See Comments)    Altered mental status   REVIEW OF SYSTEMS (Negative unless checked)  Constitutional: [] Weight loss  [] Fever  [] Chills Cardiac: [] Chest pain   [] Chest pressure   [] Palpitations   [] Shortness of breath when laying flat   [] Shortness of breath at rest   [] Shortness of breath with exertion. Vascular:  [] Pain in legs with walking   [] Pain in legs at rest   [] Pain in legs when laying flat   [] Claudication   [] Pain in feet when walking  [] Pain in feet at rest  [] Pain in feet when laying flat   [] History of DVT   [] Phlebitis   [x] Swelling in legs   [] Varicose veins   [x] Non-healing ulcers Pulmonary:   [] Uses home oxygen   [] Productive cough   [] Hemoptysis   [] Wheeze  [] COPD   [] Asthma Neurologic:  [] Dizziness  [] Blackouts   [] Seizures   [] History of stroke   [] History of TIA  [] Aphasia   [] Temporary blindness   [] Dysphagia   [] Weakness or numbness in arms   [] Weakness or numbness in legs Musculoskeletal:  [] Arthritis   [] Joint swelling   [] Joint pain   [] Low back pain Hematologic:  [] Easy bruising  [] Easy bleeding   [] Hypercoagulable state   [] Anemic  [] Hepatitis Gastrointestinal:  [x] Blood in stool   [] Vomiting blood  [] Gastroesophageal reflux/heartburn   [] Difficulty swallowing. Genitourinary:  [] Chronic kidney disease    [] Difficult urination  [] Frequent urination  [] Burning with urination   [] Blood in urine Skin:  [] Rashes   [x] Ulcers   [x] Wounds Psychological:  [] History of anxiety   []  History of major depression.  Physical Examination  Vitals:   03/11/19 0211 03/11/19 0558 03/11/19 0802 03/11/19 1140  BP: 131/82 138/85 129/71 116/67  Pulse: (!) 124 (!) 109 (!) 110 (!) 128  Resp: 19 19 17 17   Temp: (!) 100.4 F (38 C) 99.7 F (37.6 C) (!) 100.8 F (38.2 C) 99.2 F (37.3 C)  TempSrc: Oral Oral Oral Oral  SpO2:  98% 100% 99% 99%  Weight:      Height:       Body mass index is 33.41 kg/m. Gen:  Lethargic, NAD Head: [place]/AT, No temporalis wasting. Prominent temp pulse not noted. Ear/Nose/Throat: Hearing grossly intact, nares w/o erythema or drainage, oropharynx w/o Erythema/Exudate Eyes: Sclera non-icteric, conjunctiva clear Neck: Trachea midline.  No JVD.  Pulmonary:  Good air movement, respirations not labored, equal bilaterally.  Cardiac: RRR, normal S1, S2. Vascular:  Vessel Right Left  Radial Palpable Palpable  Ulnar Palpable Palpable  Brachial Palpable Palpable  Carotid Palpable, without bruit Palpable, without bruit  Aorta Not palpable N/A  Femoral Palpable Palpable  Popliteal Palpable Palpable  PT Non-Palpable Non-Palpable  DP Non-Palpable Non-Palpable   Right Lower Extremity: Thigh soft.  Calf soft.  Extremities warm distally to toes.  Minimal edema.  No ulceration is noted.  Left Lower Extremity: Thigh soft.  Calf soft.  Extremities warm distally to toes.  Minimal edema.  No ulceration is noted.  Gastrointestinal: soft, non-tender/non-distended. No guarding/reflex.  Musculoskeletal: M/S 5/5 throughout.   Neurologic: Sensation grossly intact in extremities.  Symmetrical.  Speech is fluent. Motor exam as listed above. Psychiatric: Judgment intact, Mood & affect appropriate for pt's clinical situation. Dermatologic: see below Located to left buttock: Media Information      Document Information Photos    03/10/2019 22:16  Attached To:  Hospital Encounter on 03/10/19  Source Information Ouma, Bing Neighbors, NP   [facility]   Lymph : No Cervical, Axillary, or Inguinal lymphadenopathy.  CBC Lab Results  Component Value Date   WBC 11.3 (H) 03/11/2019   HGB 8.3 (L) 03/11/2019   HCT 25.3 (L) 03/11/2019   MCV 106.3 (H) 03/11/2019   PLT 182 03/11/2019   BMET    Component Value Date/Time   NA 138 03/11/2019 0656   NA 139 03/31/2014 0606   K 3.1 (L) 03/11/2019 0656   K 4.4 03/31/2014 0606   CL 96 (L) 03/11/2019 0656   CL 102 03/31/2014 0606   CO2 27 03/11/2019 0656   CO2 32 03/31/2014 0606   GLUCOSE 171 (H) 03/11/2019 0656   GLUCOSE 138 (H) 03/31/2014 0606   BUN 27 (H) 03/11/2019 0656   BUN 17 03/31/2014 0606   CREATININE 1.74 (H) 03/11/2019 0656   CREATININE 1.06 03/31/2014 0606   CALCIUM 8.2 (L) 03/11/2019 0656   CALCIUM 9.0 03/31/2014 0606   GFRNONAA 24 (L) 03/11/2019 0656   GFRNONAA 52 (L) 03/31/2014 0606   GFRNONAA 50 (L) 05/13/2012 0432   GFRAA 28 (L) 03/11/2019 0656   GFRAA >60 03/31/2014 0606   GFRAA 58 (L) 05/13/2012 0432   Estimated Creatinine Clearance: 20.8 mL/min (A) (by C-G formula based on SCr of 1.74 mg/dL (H)).  COAG Lab Results  Component Value Date   INR 1.3 (H) 03/10/2019   INR 1.6 (H) 01/28/2019   INR 1.32 10/29/2017   Radiology Ct Abdomen Pelvis Wo Contrast  Result Date: 03/10/2019 CLINICAL DATA:  Left lower quadrant abdominal pain, rectal bleeding, sacral decubitus ulcer EXAM: CT ABDOMEN AND PELVIS WITHOUT CONTRAST TECHNIQUE: Multidetector CT imaging of the abdomen and pelvis was performed following the standard protocol without IV contrast. COMPARISON:  10/30/2009, 01/05/2008 FINDINGS: Lower chest: Trace bilateral pleural effusions with associated dependent subsegmental atelectasis. Hepatobiliary: Prior cholecystectomy. Intra and extrahepatic biliary dilatation is similar to prior CT. No focal  hepatic lesion. Pancreas: Atrophy of the pancreas without pancreatic ductal dilatation or peripancreatic inflammatory changes. Spleen: Multiple low-density intrasplenic lesions, several of  which appear increased in size compared to remote prior studies. Large lesion within the central aspect of the spleen measures 2.1 cm in diameter (series 2, image 15). Adrenals/Urinary Tract: Unremarkable adrenal glands. Large simple left renal cyst unchanged in appearance from prior. There is no hydronephrosis. Ureters and urinary bladder are within normal limits. Stomach/Bowel: Surgical suture in the rectosigmoid region related to prior bowel surgery. Abnormally eccentric thickened appearance of the low rectum (series 2, image 78) with somewhat ill-defined margins. Scattered colonic diverticulosis. Appendix not visualized. No pericolonic inflammatory changes are identified. No dilated loops of bowel. Stomach within normal limits. Vascular/Lymphatic: Aortic atherosclerosis. No enlarged abdominal or pelvic lymph nodes. Reproductive: Status post hysterectomy. No adnexal masses. Other: No ascites.  No abdominal wall hernia. Musculoskeletal: Mild skin thickening and induration with skin irregularity overlying the left gluteal region (series 2, images 57-59). No underlying fluid collection. Extensive prior lumbar fusion hardware. Additional ORIF hardware in the proximal left femur. Chronic L2 compression fracture. No acute osseous findings. No cortical destruction or periostitis to suggest osteomyelitis. IMPRESSION: 1. Abnormally thickened appearance of the low rectum which could reflect proctitis. Underlying neoplasm at this site is not excluded. Correlation with digital rectal exam is suggested. Flexible sigmoidoscopy could be considered following the resolution of acute symptoms. 2. Findings of cellulitis with mild skin breakdown over the left gluteal region without underlying fluid collection or osseous abnormality to suggest  abscess or osteomyelitis. 3. Multiple nonspecific low-density lesions within the spleen, several of which are new/enlarged compared to remote prior CT 10/30/2009. 4. Trace bilateral pleural effusions. Electronically Signed   By: Davina Poke M.D.   On: 03/10/2019 14:12   Assessment/Plan The patient is a 85 year old female with multiple medical issues (see below) well-known to our clinic as we have treated her bilateral lower extremity lymphedema / ulcerations for many years.  Patient presented to the [facility] emergency department yesterday for progressively worsening weakness and possible rectal bleeding. 1. Chronic Lymphedema: 06/17/18 ABI: Right: Resting right ankle-brachial index indicates noncompressible right lower extremity arteries.The right toe-brachial index is normal. ABIs are unreliable. Left: Resting left ankle-brachial index indicates noncompressible left lower extremity arteries.The left toe-brachial index is normal. ABIs are unreliable. Patient with mild edema to the bilateral lower extremities.  No active ulcerations noted at this time.  There is no indication for any endovascular/open vascular intervention at this time. Continue bilateral lower extremity Unna wraps. 2.  Left buttock wound: We will defer if debrided is needed to general surgery 3.  Possible rectal bleed: H&H has remained stable.  No additional episodes of bleeding since admission.  At this time, the is no indication for endovascular intervention however will follow - if showing signs of active bleeding would consider a bleeding scan.  Discussed with Dr. Mayme Genta, PA-C  03/11/2019 12:13 PM  This note was created with Dragon medical transcription system.  Any error is purely unintentional

## 2019-03-11 NOTE — Progress Notes (Signed)
MEWS Guidelines - (patients age 83 and over) Generated per MEWS protocol. Elevated temperature and HR. NP Ouma notified. Provider was already aware of situation and a course/plan of treatment was already established. Will continue to monitor patient.  Yellow - At risk for Deterioration  1. Go to room and assess patient 2. Validate data. Is this patient's baseline? If data confirmed: 3. Is this an acute change? 4. Administer prn meds/treatments as ordered? 5. Note Sepsis score 6. Review goals of care 7. Sports coach and Provider 8. Call RRT nurse as needed. 9. Document patient condition/interventions/response. 10. Increase frequency of vital signs and focused assessments to at least q2h x2. - If stable, then q4h x2 and then q8h or dept. routine. - If unstable, contact Provider & RRT nurse. Prepare for possible transfer. 11. Add entry in progress notes using the smart phrase ".MEWS".

## 2019-03-11 NOTE — Progress Notes (Signed)
Patient lethargic, arousable but unable to stay awake to tolerate PO Medications at this time. MD. Anselm Jungling paged and made aware of patient status - per MD ok to hold PO's at this time, ABX changed to IV.

## 2019-03-11 NOTE — ED Notes (Addendum)
ED TO INPATIENT HANDOFF REPORT  ED Nurse Name and Phone #:   Gershon Mussel RN  [phone]  S Name/Age/Gender Debra [place] 85 y.o. female Room/Bed: ED13A/ED13A  Code Status   Code Status: DNR  Home/SNF/Other Nursing Home Patient oriented to: self, place, time and situation Is this baseline? Yes   Triage Complete: Triage complete  Chief Complaint weakness  Triage Note Pt via ems from home [facility] [place]. She c/o feeling unwell today. Facility reports that she had gi bleed that was significant yesterday, but that there is no bleeding today. Pt reports pressure ulcer on sacrum, has been repositioned for comfort on stretcher. Pt alert & oriented, moans periodically.   Allergies Allergies  Allergen Reactions  . Sulphur [Sulfur] Hives    And seizure  . Ammonia   . Phenergan [Promethazine] Other (See Comments)    Altered mental status    Level of Care/Admitting Diagnosis ED Disposition    ED Disposition Condition [place]: [place] [100120]  Level of Care: Med-Surg [16]  Covid Evaluation: Asymptomatic Screening Protocol (No Symptoms)  Diagnosis: Sacral ulcer, with necrosis of muscle [id number]  Admitting Physician: Eula Flax  Attending Physician: Rufina Falco ACHIENG [id number]  Estimated length of stay: past midnight tomorrow  Certification:: I certify this patient will need inpatient services for at least 2 midnights  PT Class (Do Not Modify): Inpatient [101]  PT Acc Code (Do Not Modify): Private [1]       B Medical/Surgery History Past Medical History:  Diagnosis Date  . Arthritis   . Blood clot in vein 2013   left leg  . Diabetes mellitus without complication ([place]) 0000000  . Hypertension 1988  . IBS (irritable bowel syndrome)   . Myocardial infarction [facility])    Past Surgical History:  Procedure Laterality Date  . ABDOMINAL HYSTERECTOMY  age 43   partial, still has ovaries and tubes  .  APPENDECTOMY    . BACK SURGERY     screws and titanium  . BREAST BIOPSY     at age 71, benign  . CARPAL TUNNEL RELEASE Right 2010  . CARPAL TUNNEL RELEASE Left 07/25/2017   Procedure: CARPAL TUNNEL RELEASE;  Surgeon: Earnestine Leys, MD;  Location: [facility] ORS;  Service: Orthopedics;  Laterality: Left;  . CHOLECYSTECTOMY    . COLON SURGERY  1984  . COLONOSCOPY  2007, 2016   Dr Jamal Collin  . FOOT SURGERY    . FRACTURE SURGERY  2010   hip  . FRACTURE SURGERY  2009   femur  . JOINT REPLACEMENT     both knee  . PACEMAKER PLACEMENT  2013  . TONSILLECTOMY    . UPPER GI ENDOSCOPY  2016   Dr Buelah Manis IV Location/Drains/Wounds Patient Lines/Drains/Airways Status   Active Line/Drains/Airways    Name:   Placement date:   Placement time:   Site:   Days:   Peripheral IV 03/10/19 Left Antecubital   03/10/19    1213    Antecubital   1   Airway   07/25/17    1103     594   Incision (Closed) 07/25/17 Hand Left   07/25/17    1147     594          Intake/Output Last 24 hours  Intake/Output Summary (Last 24 hours) at 03/11/2019 0024 Last data filed at 03/10/2019 1644 Gross per 24 hour  Intake 299.38 ml  Output -  Net 299.38 ml    Labs/Imaging Results for orders placed or performed during the hospital encounter of 03/10/19 (from the past 48 hour(s))  Comprehensive metabolic panel     Status: Abnormal   Collection Time: 03/10/19 12:04 PM  Result Value Ref Range   Sodium 136 135 - 145 mmol/L   Potassium 3.2 (L) 3.5 - 5.1 mmol/L    Comment: HEMOLYSIS AT THIS LEVEL MAY AFFECT RESULT   Chloride 102 98 - 111 mmol/L   CO2 27 22 - 32 mmol/L   Glucose, Bld 142 (H) 70 - 99 mg/dL   BUN 26 (H) 8 - 23 mg/dL   Creatinine, Ser 1.55 (H) 0.44 - 1.00 mg/dL   Calcium 7.3 (L) 8.9 - 10.3 mg/dL   Total Protein 5.7 (L) 6.5 - 8.1 g/dL   Albumin 3.1 (L) 3.5 - 5.0 g/dL   AST 19 15 - 41 U/L    Comment: HEMOLYSIS AT THIS LEVEL MAY AFFECT RESULT   ALT 7 0 - 44 U/L   Alkaline Phosphatase 51 38 - 126 U/L    Total Bilirubin 1.4 (H) 0.3 - 1.2 mg/dL    Comment: HEMOLYSIS AT THIS LEVEL MAY AFFECT RESULT   GFR calc non Af Amer 28 (L) >60 mL/min   GFR calc Af Amer 33 (L) >60 mL/min   Anion gap 7 5 - 15    Comment: Performed at [facility], [address] [place], [place] [address]  CBC     Status: Abnormal   Collection Time: 03/10/19 12:04 PM  Result Value Ref Range   WBC 9.4 4.0 - 10.5 K/uL   RBC 2.43 (L) 3.87 - 5.11 MIL/uL   Hemoglobin 8.5 (L) 12.0 - 15.0 g/dL   HCT 25.9 (L) 36.0 - 46.0 %   MCV 106.6 (H) 80.0 - 100.0 fL   MCH 35.0 (H) 26.0 - 34.0 pg   MCHC 32.8 30.0 - 36.0 g/dL   RDW 17.9 (H) 11.5 - 15.5 %   Platelets 172 150 - 400 K/uL   nRBC 0.5 (H) 0.0 - 0.2 %    Comment: Performed at [facility], [address] [place], [place] [address]  Type and screen [place]     Status: None   Collection Time: 03/10/19 12:04 PM  Result Value Ref Range   ABO/RH(D) O POS    Antibody Screen NEG    Sample Expiration      03/13/2019,2359 Performed at [facility], [address] [place], [place] [address]   Protime-INR - (order if Patient is taking Coumadin / Warfarin)     Status: Abnormal   Collection Time: 03/10/19 12:04 PM  Result Value Ref Range   Prothrombin Time 16.0 (H) 11.4 - 15.2 seconds   INR 1.3 (H) 0.8 - 1.2    Comment: (NOTE) INR goal varies based on device and disease states. Performed at [facility], [address] [place], [place] [address]   Wound or Superficial Culture     Status: None (Preliminary result)   Collection Time: 03/10/19  3:23 PM   Specimen: Decubitis; Wound  Result Value Ref Range   Specimen Description      DECUBITIS Performed at [facility], [address] [place], [place] [address]    Special Requests      NONE Performed at [facility], [place], [place] [address]    Gram Stain      MODERATE WBC PRESENT, PREDOMINANTLY PMN ABUNDANT  GRAM  POSITIVE COCCI ABUNDANT GRAM NEGATIVE RODS FEW GRAM POSITIVE RODS Performed at [facility], [place] [address], [place], [place] [address]    Culture PENDING    Report Status PENDING    Ct Abdomen Pelvis Wo Contrast  Result Date: 03/10/2019 CLINICAL DATA:  Left lower quadrant abdominal pain, rectal bleeding, sacral decubitus ulcer EXAM: CT ABDOMEN AND PELVIS WITHOUT CONTRAST TECHNIQUE: Multidetector CT imaging of the abdomen and pelvis was performed following the standard protocol without IV contrast. COMPARISON:  10/30/2009, 01/05/2008 FINDINGS: Lower chest: Trace bilateral pleural effusions with associated dependent subsegmental atelectasis. Hepatobiliary: Prior cholecystectomy. Intra and extrahepatic biliary dilatation is similar to prior CT. No focal hepatic lesion. Pancreas: Atrophy of the pancreas without pancreatic ductal dilatation or peripancreatic inflammatory changes. Spleen: Multiple low-density intrasplenic lesions, several of which appear increased in size compared to remote prior studies. Large lesion within the central aspect of the spleen measures 2.1 cm in diameter (series 2, image 15). Adrenals/Urinary Tract: Unremarkable adrenal glands. Large simple left renal cyst unchanged in appearance from prior. There is no hydronephrosis. Ureters and urinary bladder are within normal limits. Stomach/Bowel: Surgical suture in the rectosigmoid region related to prior bowel surgery. Abnormally eccentric thickened appearance of the low rectum (series 2, image 78) with somewhat ill-defined margins. Scattered colonic diverticulosis. Appendix not visualized. No pericolonic inflammatory changes are identified. No dilated loops of bowel. Stomach within normal limits. Vascular/Lymphatic: Aortic atherosclerosis. No enlarged abdominal or pelvic lymph nodes. Reproductive: Status post hysterectomy. No adnexal masses. Other: No ascites.  No abdominal wall hernia. Musculoskeletal: Mild skin thickening and  induration with skin irregularity overlying the left gluteal region (series 2, images 57-59). No underlying fluid collection. Extensive prior lumbar fusion hardware. Additional ORIF hardware in the proximal left femur. Chronic L2 compression fracture. No acute osseous findings. No cortical destruction or periostitis to suggest osteomyelitis. IMPRESSION: 1. Abnormally thickened appearance of the low rectum which could reflect proctitis. Underlying neoplasm at this site is not excluded. Correlation with digital rectal exam is suggested. Flexible sigmoidoscopy could be considered following the resolution of acute symptoms. 2. Findings of cellulitis with mild skin breakdown over the left gluteal region without underlying fluid collection or osseous abnormality to suggest abscess or osteomyelitis. 3. Multiple nonspecific low-density lesions within the spleen, several of which are new/enlarged compared to remote prior CT 10/30/2009. 4. Trace bilateral pleural effusions. Electronically Signed   By: Davina Poke M.D.   On: 03/10/2019 14:12    Pending Labs Unresulted Labs (From admission, onward)    Start     Ordered   03/11/19 [id number]  Basic metabolic panel  Tomorrow morning,   STAT     03/10/19 2245   03/11/19 0500  CBC  Tomorrow morning,   STAT     03/10/19 2245   03/10/19 [id number]  Basic metabolic panel  Once,   STAT     03/10/19 2245   03/10/19 2238  CBC with Differential  Once,   STAT     03/10/19 2245   03/10/19 2238  Hemoglobin A1c  Once,   STAT     03/10/19 2245   03/10/19 2238  HIV Antibody (routine testing w rflx)  (HIV Antibody (Routine testing w reflex) panel)  Once,   STAT     03/10/19 2245   03/10/19 2238  HIV4GL Save Tube  (HIV Antibody (Routine testing w reflex) panel)  Once,   STAT     03/10/19 2245   03/10/19 2238  Sedimentation rate  Once,   STAT     03/10/19 2245   03/10/19 2238  C-reactive protein  Once,   STAT     03/10/19 2245   03/10/19 2238  Prealbumin  Once,   STAT      03/10/19 2245   03/10/19 2238  Blood Cultures x 2 sites  BLOOD CULTURE X 2,   STAT     03/10/19 2245   03/10/19 1353  SARS CORONAVIRUS 2 (TAT 6-24 HRS) Nasopharyngeal Nasopharyngeal Swab  (Asymptomatic/Tier 2 Patients Labs)  Once,   STAT    Question Answer Comment  Is this test for diagnosis or screening Screening   Symptomatic for COVID-19 as defined by CDC No   Hospitalized for COVID-19 No   Admitted to ICU for COVID-19 No   Previously tested for COVID-19 Yes   Resident in a congregate (group) care setting No   Employed in healthcare setting No   Pregnant No      03/10/19 1353          Vitals/Pain Today's Vitals   03/10/19 2130 03/10/19 2200 03/10/19 2300 03/10/19 2330  BP: 116/74 139/81 123/67 124/66  Pulse: (!) 112 (!) 102 100 94  Resp: (!) 23 (!) 23 16 (!) 23  Temp:      TempSrc:      SpO2: 94% 100% 100% 100%  Weight:      Height:      PainSc:        Isolation Precautions No active isolations  Medications Medications  vancomycin (VANCOCIN) IVPB 1000 mg/200 mL premix (1,000 mg Intravenous Not Given 03/10/19 2317)  diltiazem (DILACOR XR) 24 hr capsule 180 mg (has no administration in time range)  nitroGLYCERIN (NITROSTAT) SL tablet 0.4 mg (has no administration in time range)  clorazepate (TRANXENE) tablet 3.75 mg (has no administration in time range)  levothyroxine (SYNTHROID) tablet 112 mcg (has no administration in time range)  cyanocobalamin ((VITAMIN B-12)) injection 1,000 mcg (has no administration in time range)  pregabalin (LYRICA) capsule 100 mg (has no administration in time range)  cholecalciferol (VITAMIN D) tablet 1,000 Units (has no administration in time range)  Pramox-PE-Glycerin-Petrolatum [id number] % CREA 1 application (has no administration in time range)  cefTRIAXone (ROCEPHIN) 2 g in sodium chloride 0.9 % 100 mL IVPB (has no administration in time range)  metroNIDAZOLE (FLAGYL) tablet 500 mg (has no administration in time range)  0.9 %   sodium chloride infusion (has no administration in time range)  insulin aspart (novoLOG) injection 0-9 Units (has no administration in time range)  insulin aspart (novoLOG) injection 0-5 Units (has no administration in time range)  vancomycin (VANCOCIN) IVPB 1000 mg/200 mL premix (0 mg Intravenous Stopped 03/10/19 1448)  cefTRIAXone (ROCEPHIN) 2 g in sodium chloride 0.9 % 100 mL IVPB (0 g Intravenous Stopped 03/10/19 1644)    Mobility non-ambulatory Moderate fall risk   Focused Assessments Cardiac Assessment Handoff:  Cardiac Rhythm: Atrial fibrillation Lab Results  Component Value Date   CKTOTAL 27 05/12/2012   CKMB 1.2 05/12/2012   TROPONINI <0.03 09/06/2016   No results found for: DDIMER Does the Patient currently have chest pain? No     R Recommendations: See Admitting Provider Note  Report given to: Acia RN on 1C  Additional Notes:  Large dicub ulcer on left gluteus.

## 2019-03-11 NOTE — Plan of Care (Signed)
PMT note:  Patient is resting in bed. She is sleeping and awakens, but falls back asleep. Spoke with her HPOA. Ms. Debra [place] states Ms. Debra [place] has been reviewing funeral planning with her earlier today, and the instructions sound like those provided historically. She states patient has told her she does not want anything else done to her, and she is ready to see Debra [place], her deceased husband.   Plans for a [place] meeting tomorrow at 12:00, where hopefully patient will be able to participate.

## 2019-03-11 NOTE — Progress Notes (Signed)
Pharmacy Antibiotic Note  Debra [place] is a 85 y.o. female admitted on 03/10/2019 with cellulitis.  Pharmacy has been consulted for Vancomycin dosing.  85yo F w/ elevated SCr  Plan: Vancomycin level 17. Will dose vancomycin when level < 15. Will order vancomycin level with morning labs.  Height: 5\' 4"  (162.6 cm) Weight: 194 lb 10.7 oz (88.3 kg) IBW/kg (Calculated) : 54.7  Temp (24hrs), Avg:99.8 F (37.7 C), Min:99 F (37.2 C), Max:100.8 F (38.2 C)  Recent Labs  Lab 03/10/19 1204 03/11/19 0243 03/11/19 0433 03/11/19 0656 03/11/19 1524  WBC 9.4 11.2* 10.3 11.3*  --   CREATININE 1.55* 1.69* 1.72* 1.74*  --   VANCORANDOM  --   --   --   --  17    Estimated Creatinine Clearance: 20.8 mL/min (A) (by C-G formula based on SCr of 1.74 mg/dL (H)).    Allergies  Allergen Reactions  . Sulphur [Sulfur] Hives    And seizure  . Ammonia   . Phenergan [Promethazine] Other (See Comments)    Altered mental status   Antimicrobials this admission: Rocephin 10/12 >> Flagyl 10/13 >> Vancomycin 10/12 >>   Dose adjustments this admission:   Microbiology results: BCx x2 NGTD MRSA PCR neg WCx pending   Thank you for allowing pharmacy to be a part of this patient's care.  Tawnya Crook, PharmD 03/11/2019 4:23 PM

## 2019-03-11 NOTE — Progress Notes (Signed)
[place] at [place] NAME: Debra [place]    MR#:  [id number]  DATE OF BIRTH:  1921-10-04  SUBJECTIVE:  CHIEF COMPLAINT:   Chief Complaint  Patient presents with  . GI Bleeding   Sent from her assisted living place for episode of GI bleed.  Hemoglobin is stable.  She has been going downhill and her strength for last few weeks.  She noted to have sacral decubitus ulcer which seems to be infected.  She had low-grade fever this morning.  She appears oriented and could not give me much details but I spoke to her POA on the phone later on.  REVIEW OF SYSTEMS:   CONSTITUTIONAL: No fever, fatigue or weakness.  EYES: No blurred or double vision.  EARS, NOSE, AND THROAT: No tinnitus or ear pain.  RESPIRATORY: No cough, shortness of breath, wheezing or hemoptysis.  CARDIOVASCULAR: No chest pain, orthopnea, edema.  GASTROINTESTINAL: No nausea, vomiting, diarrhea or abdominal pain.  GENITOURINARY: No dysuria, hematuria.  ENDOCRINE: No polyuria, nocturia,  HEMATOLOGY: No anemia, easy bruising or bleeding SKIN: No rash or lesion. MUSCULOSKELETAL: No joint pain or arthritis.   NEUROLOGIC: No tingling, numbness, weakness.  PSYCHIATRY: No anxiety or depression.   ROS  DRUG ALLERGIES:   Allergies  Allergen Reactions  . Sulphur [Sulfur] Hives    And seizure  . Ammonia   . Phenergan [Promethazine] Other (See Comments)    Altered mental status    VITALS:  Blood pressure 116/67, pulse (!) 128, temperature 99.2 F (37.3 C), temperature source Oral, resp. rate 17, height 5\' 4"  (1.626 m), weight 88.3 kg, SpO2 99 %.  PHYSICAL EXAMINATION:   GENERAL:  85 y.o.-year-old patient lying in the bed with no acute distress.  EYES: Pupils equal, round, reactive to light and accommodation. No scleral icterus. Extraocular muscles intact.  HEENT: Head atraumatic, normocephalic. Oropharynx and nasopharynx clear.  NECK: Supple, no jugular venous distention. No  thyroid enlargement, no tenderness.  LUNGS: Normal breath sounds bilaterally, no wheezing, rales,rhonchi or crepitation. No use of accessory muscles of respiration.  CARDIOVASCULAR: S1, S2 normal. No murmurs, rubs, or gallops.  ABDOMEN: Soft, nontender, nondistended. Bowel sounds present. No organomegaly or mass.  EXTREMITIES: No pedal edema, cyanosis, or clubbing.  NEUROLOGIC: Cranial nerves II through XII are intact. Muscle strength 3-4/5 in all extremities. Sensation intact. Gait not checked.  PSYCHIATRIC: The patient is alert and oriented x 3.  SKIN: Multiple pressure ulcers, venous statis and skin breakdown   Physical Exam LABORATORY PANEL:   CBC Recent Labs  Lab 03/11/19 0656  WBC 11.3*  HGB 8.3*  HCT 25.3*  PLT 182   ------------------------------------------------------------------------------------------------------------------  Chemistries  Recent Labs  Lab 03/10/19 1204  03/11/19 0433 03/11/19 0656  NA 136   < > 138 138  K 3.2*   < > 2.7* 3.1*  CL 102   < > 96* 96*  CO2 27   < > 29 27  GLUCOSE 142*   < > 223* 171*  BUN 26*   < > 28* 27*  CREATININE 1.55*   < > 1.72* 1.74*  CALCIUM 7.3*   < > 8.1* 8.2*  MG  --   --  1.6*  --   AST 19  --   --   --   ALT 7  --   --   --   ALKPHOS 51  --   --   --   BILITOT 1.4*  --   --   --    < > =  values in this interval not displayed.   ------------------------------------------------------------------------------------------------------------------  Cardiac Enzymes No results for input(s): TROPONINI in the last 168 hours. ------------------------------------------------------------------------------------------------------------------  RADIOLOGY:  Ct Abdomen Pelvis Wo Contrast  Result Date: 03/10/2019 CLINICAL DATA:  Left lower quadrant abdominal pain, rectal bleeding, sacral decubitus ulcer EXAM: CT ABDOMEN AND PELVIS WITHOUT CONTRAST TECHNIQUE: Multidetector CT imaging of the abdomen and pelvis was performed  following the standard protocol without IV contrast. COMPARISON:  10/30/2009, 01/05/2008 FINDINGS: Lower chest: Trace bilateral pleural effusions with associated dependent subsegmental atelectasis. Hepatobiliary: Prior cholecystectomy. Intra and extrahepatic biliary dilatation is similar to prior CT. No focal hepatic lesion. Pancreas: Atrophy of the pancreas without pancreatic ductal dilatation or peripancreatic inflammatory changes. Spleen: Multiple low-density intrasplenic lesions, several of which appear increased in size compared to remote prior studies. Large lesion within the central aspect of the spleen measures 2.1 cm in diameter (series 2, image 15). Adrenals/Urinary Tract: Unremarkable adrenal glands. Large simple left renal cyst unchanged in appearance from prior. There is no hydronephrosis. Ureters and urinary bladder are within normal limits. Stomach/Bowel: Surgical suture in the rectosigmoid region related to prior bowel surgery. Abnormally eccentric thickened appearance of the low rectum (series 2, image 78) with somewhat ill-defined margins. Scattered colonic diverticulosis. Appendix not visualized. No pericolonic inflammatory changes are identified. No dilated loops of bowel. Stomach within normal limits. Vascular/Lymphatic: Aortic atherosclerosis. No enlarged abdominal or pelvic lymph nodes. Reproductive: Status post hysterectomy. No adnexal masses. Other: No ascites.  No abdominal wall hernia. Musculoskeletal: Mild skin thickening and induration with skin irregularity overlying the left gluteal region (series 2, images 57-59). No underlying fluid collection. Extensive prior lumbar fusion hardware. Additional ORIF hardware in the proximal left femur. Chronic L2 compression fracture. No acute osseous findings. No cortical destruction or periostitis to suggest osteomyelitis. IMPRESSION: 1. Abnormally thickened appearance of the low rectum which could reflect proctitis. Underlying neoplasm at this  site is not excluded. Correlation with digital rectal exam is suggested. Flexible sigmoidoscopy could be considered following the resolution of acute symptoms. 2. Findings of cellulitis with mild skin breakdown over the left gluteal region without underlying fluid collection or osseous abnormality to suggest abscess or osteomyelitis. 3. Multiple nonspecific low-density lesions within the spleen, several of which are new/enlarged compared to remote prior CT 10/30/2009. 4. Trace bilateral pleural effusions. Electronically Signed   By: Davina Poke M.D.   On: 03/10/2019 14:12    ASSESSMENT AND PLAN:   Active Problems:   Sacral ulcer, with necrosis of muscle ([place])  85 y.o. female  with pertinent past medical history of diabetes mellitus, hypertension, atrial fibrillation, IBS, MI, bilateral lower extremity lymphedema, venous stasis ulcers of bilateral lower legs, traumatic hematoma of buttock now with sacral abscess presenting to the ED due to a large amount of rectal bleeding and pain in her sacral decubitus wound.  1. Anemia due to blood loss from sacral decubitus ulcers - No other sources of bleeding identified - Hemoglobin drop 9.7-8.5 - H&H monitoring q6h - Hold NSAIDs, steroids, ASA - Hold Coumadin for now may need to restart if hemoglobin stable -Because of old age, I would like to hold on GI consult or further work-up at this time.  2. Left sacral decubitus ulcers with necrosis - CT suggests proctitis as well as some cellulitis surrounding his sacral wound - Blood cultures pending - Start empiric antibiotics - Vascular surgery consult.   -As per vascular surgery, recommending consult with general surgery at this time as this is not on peripheral area  due to vascular issues.  3. Paroxysmal atrial fibrillation-rate controlled on diltiazem - Hold Coumadin for now  4. Hypokalemia - Replete with IV potassium - Check mag - Recheck potassium levels  5. Diabetes mellitus - Hold  glipizide and Metformin - SSI  6. DVT prophylaxis - Hold anti-coagulation for suspect bleeding  7.  Functional decline I had a long discussion with her POA, as per her for last few months patient has gradual decline.  She was transferred to assisted living place 1-1/2 months ago but continued to have significant decline in her functional status and stays mostly in the bed and developed ulcer.  She also does not eat good now. I informed her that usually this type of things at old age is not very good signs or good prognosis.  This could lead into continued worsening and patient may need long-term nursing home placement.  I have also encouraged to meet with palliative care.   All the records are reviewed and case discussed with Care Management/Social Workerr. Management plans discussed with the patient, family and they are in agreement.  CODE STATUS: DNR  TOTAL TIME TAKING CARE OF THIS PATIENT: 45 minutes.     POSSIBLE D/C IN 2-3 DAYS, DEPENDING ON CLINICAL CONDITION.   [name] M.D on 03/11/2019   Between 7am to 6pm - Pager - [phone]  After 6pm go to [email or web address] - password [email or web address] [place]  Office  [phone]  CC: Primary care physician; Lenard Simmer, MD  Note: This dictation was prepared with Dragon dictation along with smaller phrase technology. Any transcriptional errors that result from this process are unintentional.

## 2019-03-11 NOTE — Progress Notes (Signed)
Pharmacy Antibiotic Note  Debra Butler is a 85 y.o. female admitted on 03/10/2019 with cellulitis.  Pharmacy has been consulted for Vancomycin dosing.  85yo F w/ elevated SCr  Plan: Vancomycin 1gm x 1 given on 10/12 ~ 1pm Vancomycin 1gm x 1 ordered to be given on 10/13 to make 2gm total. Additional vancomycin doses based on levels / renal fxn  Height: 5\' 4"  (162.6 cm) Weight: 201 lb 1 oz (91.2 kg) IBW/kg (Calculated) : 54.7  Temp (24hrs), Avg:99.7 F (37.6 C), Min:98.9 F (37.2 C), Max:100.4 F (38 C)  Recent Labs  Lab 03/10/19 1204  WBC 9.4  CREATININE 1.55*    Estimated Creatinine Clearance: 23.8 mL/min (A) (by C-G formula based on SCr of 1.55 mg/dL (H)).    Allergies  Allergen Reactions  . Sulphur [Sulfur] Hives    And seizure  . Ammonia   . Phenergan [Promethazine] Other (See Comments)    Altered mental status   Antimicrobials this admission: Rocephin 10/12 x 1 Flagyl 10/13 x 1 Vancomycin 10/12 >>   Dose adjustments this admission:   Microbiology results:  BCx:   UCx:    Sputum:    MRSA PCR:   Thank you for allowing pharmacy to be a part of this patient's care.  Hart Robinsons A 03/11/2019 2:24 AM

## 2019-03-12 DIAGNOSIS — Z515 Encounter for palliative care: Secondary | ICD-10-CM

## 2019-03-12 DIAGNOSIS — Z7189 Other specified counseling: Secondary | ICD-10-CM

## 2019-03-12 LAB — BASIC METABOLIC PANEL
Anion gap: 14 (ref 5–15)
BUN: 27 mg/dL — ABNORMAL HIGH (ref 8–23)
CO2: 24 mmol/L (ref 22–32)
Calcium: 7.9 mg/dL — ABNORMAL LOW (ref 8.9–10.3)
Chloride: 102 mmol/L (ref 98–111)
Creatinine, Ser: 1.67 mg/dL — ABNORMAL HIGH (ref 0.44–1.00)
GFR calc Af Amer: 30 mL/min — ABNORMAL LOW (ref >60)
GFR calc non Af Amer: 26 mL/min — ABNORMAL LOW (ref >60)
Glucose, Bld: 167 mg/dL — ABNORMAL HIGH (ref 70–99)
Potassium: 2.9 mmol/L — ABNORMAL LOW (ref 3.5–5.1)
Sodium: 140 mmol/L (ref 135–145)

## 2019-03-12 LAB — GLUCOSE, CAPILLARY
Glucose-Capillary: 149 mg/dL — ABNORMAL HIGH (ref 70–99)
Glucose-Capillary: 155 mg/dL — ABNORMAL HIGH (ref 70–99)

## 2019-03-12 LAB — CBC
HCT: 23.9 % — ABNORMAL LOW (ref 36.0–46.0)
Hemoglobin: 7.8 g/dL — ABNORMAL LOW (ref 12.0–15.0)
MCH: 34.8 pg — ABNORMAL HIGH (ref 26.0–34.0)
MCHC: 32.6 g/dL (ref 30.0–36.0)
MCV: 106.7 fL — ABNORMAL HIGH (ref 80.0–100.0)
Platelets: 162 10*3/uL (ref 150–400)
RBC: 2.24 MIL/uL — ABNORMAL LOW (ref 3.87–5.11)
RDW: 17 % — ABNORMAL HIGH (ref 11.5–15.5)
WBC: 12.2 10*3/uL — ABNORMAL HIGH (ref 4.0–10.5)
nRBC: 0.2 % (ref 0.0–0.2)

## 2019-03-12 LAB — MAGNESIUM: Magnesium: 2.2 mg/dL (ref 1.7–2.4)

## 2019-03-12 LAB — VANCOMYCIN, RANDOM: Vancomycin Rm: 14

## 2019-03-12 MED ORDER — ACETAMINOPHEN 325 MG PO TABS
650.0000 mg | ORAL_TABLET | Freq: Four times a day (QID) | ORAL | Status: DC | PRN
  Administered 2019-03-12: 01:00:00 650 mg via ORAL
  Filled 2019-03-12: qty 2

## 2019-03-12 MED ORDER — HALOPERIDOL 0.5 MG PO TABS
0.5000 mg | ORAL_TABLET | ORAL | Status: DC | PRN
  Filled 2019-03-12: qty 1

## 2019-03-12 MED ORDER — OXYCODONE HCL 20 MG/ML PO CONC: 5.0000 mg | ORAL | Status: DC | PRN

## 2019-03-12 MED ORDER — BIOTENE DRY MOUTH MT LIQD: 15.0000 mL | OROMUCOSAL | Status: DC | PRN

## 2019-03-12 MED ORDER — LORAZEPAM 2 MG/ML PO CONC
1.0000 mg | ORAL | Status: DC | PRN
  Filled 2019-03-12: qty 0.5

## 2019-03-12 MED ORDER — MORPHINE SULFATE (PF) 2 MG/ML IV SOLN
1.0000 mg | INTRAVENOUS | Status: DC | PRN
  Administered 2019-03-12: 2 mg via INTRAVENOUS
  Administered 2019-03-13: 1 mg via INTRAVENOUS
  Administered 2019-03-17 (×2): 2 mg via INTRAVENOUS
  Filled 2019-03-12 (×4): qty 1

## 2019-03-12 MED ORDER — GLYCOPYRROLATE 0.2 MG/ML IJ SOLN
0.2000 mg | INTRAMUSCULAR | Status: DC | PRN
  Filled 2019-03-12: qty 1

## 2019-03-12 MED ORDER — ONDANSETRON HCL 4 MG/2ML IJ SOLN
4.0000 mg | Freq: Four times a day (QID) | INTRAMUSCULAR | Status: DC | PRN
  Administered 2019-03-12 – 2019-03-17 (×4): 4 mg via INTRAVENOUS
  Filled 2019-03-12 (×4): qty 2

## 2019-03-12 MED ORDER — HALOPERIDOL LACTATE 5 MG/ML IJ SOLN: 0.5000 mg | INTRAMUSCULAR | Status: DC | PRN

## 2019-03-12 MED ORDER — ONDANSETRON 4 MG PO TBDP
4.0000 mg | ORAL_TABLET | Freq: Four times a day (QID) | ORAL | Status: DC | PRN
  Filled 2019-03-12: qty 1

## 2019-03-12 MED ORDER — SODIUM CHLORIDE 0.9% FLUSH
3.0000 mL | Freq: Two times a day (BID) | INTRAVENOUS | Status: DC
  Administered 2019-03-12 – 2019-03-18 (×10): 3 mL via INTRAVENOUS

## 2019-03-12 MED ORDER — VANCOMYCIN HCL IN DEXTROSE 1-5 GM/200ML-% IV SOLN
1000.0000 mg | Freq: Once | INTRAVENOUS | Status: AC
  Administered 2019-03-12: 1000 mg via INTRAVENOUS
  Filled 2019-03-12 (×2): qty 200

## 2019-03-12 MED ORDER — OXYCODONE HCL 5 MG/5ML PO SOLN
5.0000 mg | ORAL | Status: DC | PRN
  Administered 2019-03-13 – 2019-03-14 (×2): 5 mg via ORAL
  Filled 2019-03-12 (×2): qty 5

## 2019-03-12 MED ORDER — GLYCOPYRROLATE 1 MG PO TABS
1.0000 mg | ORAL_TABLET | ORAL | Status: DC | PRN
  Filled 2019-03-12: qty 1

## 2019-03-12 MED ORDER — SODIUM CHLORIDE 0.9% FLUSH: 3.0000 mL | INTRAVENOUS | Status: DC | PRN

## 2019-03-12 MED ORDER — HALOPERIDOL LACTATE 2 MG/ML PO CONC
0.5000 mg | ORAL | Status: DC | PRN
  Filled 2019-03-12: qty 0.3

## 2019-03-12 MED ORDER — POTASSIUM CHLORIDE CRYS ER 20 MEQ PO TBCR: 40.0000 meq | EXTENDED_RELEASE_TABLET | Freq: Two times a day (BID) | ORAL | Status: DC

## 2019-03-12 MED ORDER — LORAZEPAM 1 MG PO TABS: 1.0000 mg | ORAL_TABLET | ORAL | Status: DC | PRN

## 2019-03-12 MED ORDER — POLYVINYL ALCOHOL 1.4 % OP SOLN
1.0000 [drp] | Freq: Four times a day (QID) | OPHTHALMIC | Status: DC | PRN
  Filled 2019-03-12: qty 15

## 2019-03-12 MED ORDER — LORAZEPAM 2 MG/ML IJ SOLN: 0.5000 mg | INTRAMUSCULAR | Status: DC | PRN

## 2019-03-12 NOTE — Progress Notes (Signed)
Pharmacy Antibiotic Note  Debra Butler is a 85 y.o. female admitted on 03/10/2019 with cellulitis/sacral decub ulcer. Pharmacy has been consulted for Vancomycin dosing.  85yo F w/ elevated SCr  Plan: Vancomycin 1g x 1 given on 10/12 at 1348 Vancomycin 1g x 1 given on 10/13 at 0331 to make 2g total.  Additional vancomycin doses based on levels / renal fxn SCr ordered for AM to continue to monitor renal function.  10/13 1524:  Vancomycin level 17. Will dose vancomycin when level < 15. Will order vancomycin level with morning labs.  10/14 0424: Vancomycin level = 14 mcg/ml. Will order Vancomycin 1 gram IV x 1 dose for Crcl 21.7 ml/min. Will order Vancomycin level with am labs to assess for next dose.  F/u wound cx    Height: 5\' 4"  (162.6 cm) Weight: 194 lb 10.7 oz (88.3 kg) IBW/kg (Calculated) : 54.7  Temp (24hrs), Avg:99.5 F (37.5 C), Min:97.8 F (36.6 C), Max:102.7 F (39.3 C)  Recent Labs  Lab 03/10/19 1204 03/11/19 0243 03/11/19 0433 03/11/19 0656 03/11/19 1524 03/12/19 0424  WBC 9.4 11.2* 10.3 11.3*  --  12.2*  CREATININE 1.55* 1.69* 1.72* 1.74*  --  1.67*  VANCORANDOM  --   --   --   --  17 14    Estimated Creatinine Clearance: 21.7 mL/min (A) (by C-G formula based on SCr of 1.67 mg/dL (H)).    Allergies  Allergen Reactions  . Sulphur [Sulfur] Hives    And seizure  . Ammonia   . Phenergan [Promethazine] Other (See Comments)    Altered mental status   Antimicrobials this admission: Rocephin 10/12 >> Flagyl 10/13 >> Vancomycin 10/12 >>   Dose adjustments this admission:   Microbiology results: BCx x2 NGTD MRSA PCR neg WCx abundant GPC, abundant GNR, few GPR   Thank you for allowing pharmacy to be a part of this patient's care.  Mattison Golay A, PharmD 03/12/2019 7:17 AM

## 2019-03-12 NOTE — Progress Notes (Signed)
PT Cancellation Note  Patient Details Name: Debra Butler MRN: [id number] DOB: 1922-03-02   Cancelled Treatment:    Reason Eval/Treat Not Completed: Other (comment)(PT consult recieved, chart reviewed. Per chart, family meeting is scheduled for today to establish POC. PT to hold until clear plan has been made.)   Lieutenant Diego PT, DPT 11:33 AM,03/12/19 [phone]

## 2019-03-12 NOTE — Progress Notes (Signed)
Spoke with Palliative care provider at bedside. Pt to be comfort care. Awaiting orders at this time.

## 2019-03-12 NOTE — Progress Notes (Signed)
[facility] [place] NAME: Debra Butler    MR#:  [id number]  DATE OF BIRTH:  1921-08-01  SUBJECTIVE:  CHIEF COMPLAINT:   Chief Complaint  Patient presents with  . GI Bleeding   Sent from her assisted living place for episode of GI bleed.  Hemoglobin is stable.  She has been going downhill and her strength for last few weeks.  She noted to have sacral decubitus ulcer which seems to be infected.  She had low-grade fever this morning.  Patient was more drowsy and not able to take any oral medication or food today.  REVIEW OF SYSTEMS:   Due to altered mental status and drowsiness patient cannot give reliable review of system. ROS  DRUG ALLERGIES:   Allergies  Allergen Reactions  . Sulphur [Sulfur] Hives    And seizure  . Ammonia   . Phenergan [Promethazine] Other (See Comments)    Altered mental status    VITALS:  Blood pressure 113/77, pulse 92, temperature 97.8 F (36.6 C), temperature source Oral, resp. rate 19, height 5\' 4"  (1.626 m), weight 88.3 kg, SpO2 99 %.  PHYSICAL EXAMINATION:   GENERAL:  85 y.o.-year-old patient lying in the bed with no acute distress.  EYES: Pupils equal, round, reactive to light and accommodation. No scleral icterus. Extraocular muscles intact.  HEENT: Head atraumatic, normocephalic. Oropharynx and nasopharynx clear.  NECK: Supple, no jugular venous distention. No thyroid enlargement, no tenderness.  LUNGS: Normal breath sounds bilaterally, no wheezing, rales,rhonchi or crepitation. No use of accessory muscles of respiration.  CARDIOVASCULAR: S1, S2 normal. No murmurs, rubs, or gallops.  ABDOMEN: Soft, nontender, nondistended. Bowel sounds present. No organomegaly or mass.  EXTREMITIES: No pedal edema, cyanosis, or clubbing.  NEUROLOGIC: Patient is drowsy and barely arousable cannot give review of system or cannot exam for strength and she does not follow command. PSYCHIATRIC: The patient is drowsy  today. SKIN: Multiple pressure ulcers, venous statis and skin breakdown   Physical Exam LABORATORY PANEL:   CBC Recent Labs  Lab 03/12/19 0424  WBC 12.2*  HGB 7.8*  HCT 23.9*  PLT 162   ------------------------------------------------------------------------------------------------------------------  Chemistries  Recent Labs  Lab 03/10/19 1204  03/12/19 0424  NA 136   < > 140  K 3.2*   < > 2.9*  CL 102   < > 102  CO2 27   < > 24  GLUCOSE 142*   < > 167*  BUN 26*   < > 27*  CREATININE 1.55*   < > 1.67*  CALCIUM 7.3*   < > 7.9*  MG  --    < > 2.2  AST 19  --   --   ALT 7  --   --   ALKPHOS 51  --   --   BILITOT 1.4*  --   --    < > = values in this interval not displayed.   ------------------------------------------------------------------------------------------------------------------  Cardiac Enzymes No results for input(s): TROPONINI in the last 168 hours. ------------------------------------------------------------------------------------------------------------------  RADIOLOGY:  No results found.  ASSESSMENT AND PLAN:   Active Problems:   Sacral ulcer, with necrosis of muscle ([place])  85 y.o. female  with pertinent past medical history of diabetes mellitus, hypertension, atrial fibrillation, IBS, MI, bilateral lower extremity lymphedema, venous stasis ulcers of bilateral lower legs, traumatic hematoma of buttock now with sacral abscess presenting to the ED due to a large amount of rectal bleeding and pain in her sacral decubitus wound.  1. Anemia due to blood loss from sacral decubitus ulcers - No other sources of bleeding identified - Hemoglobin drop 9.7-8.5 - H&H monitoring q6h - Hold NSAIDs, steroids, ASA - Hold Coumadin for now may need to restart if hemoglobin stable -Because of old age, I would like to hold on GI consult or further work-up at this time. -Hemoglobin dropped some further but patient is now comfort care after meeting with palliative  care and POA.  2. Left sacral decubitus ulcers with necrosis - CT suggests proctitis as well as some cellulitis surrounding his sacral wound - Blood cultures pending - Started empiric antibiotics - We had called general surgery consult but patient's POA had decided to make her comfort care and no aggressive or surgical approach so no further needs.  3. Paroxysmal atrial fibrillation-rate controlled on diltiazem - Hold Coumadin for now  4. Hypokalemia - Replete with IV potassium - Check mag - Comfort care and awaited for hospice now.  5. Diabetes mellitus - Hold glipizide and Metformin - SSI  6. DVT prophylaxis - Hold anti-coagulation for suspect bleeding  7.  Functional decline I had a long discussion with her POA, as per her for last few months patient has gradual decline.  She was transferred to assisted living place 1-1/2 months ago but continued to have significant decline in her functional status and stays mostly in the bed and developed ulcer.  She also does not eat good now. I informed her that usually this type of things at old age is not very good signs or good prognosis.  This could lead into continued worsening and patient may need long-term nursing home placement.    After meeting with palliative care, POA agreed on comfort measures and hospice placement.  All the records are reviewed and case discussed with Care Management/Social Workerr. Management plans discussed with the patient, family and they are in agreement.  CODE STATUS: DNR  TOTAL TIME TAKING CARE OF THIS PATIENT: 35 minutes.     POSSIBLE D/C IN 2-3 DAYS, DEPENDING ON CLINICAL CONDITION.   [name] M.D on 03/12/2019   Between 7am to 6pm - Pager - [phone]  After 6pm go to [email or web address] - password [email or web address] [facility]  Office  [phone]  CC: Primary care physician; Lenard Simmer, MD  Note: This dictation was prepared with Dragon dictation along  with smaller phrase technology. Any transcriptional errors that result from this process are unintentional.

## 2019-03-12 NOTE — TOC Initial Note (Signed)
Transition of Care [facility]) - Initial/Assessment Note    Patient Details  Name: Debra Butler MRN: [id number] Date of Birth: [birth year]  [facility] CM/SW Contact:    Su Hilt, RN Phone Number: 03/12/2019, 2:56 PM  Clinical Narrative:                 Met with the patient and her Dorian Heckle in the room to discuss DC plan and needs She came from the [facility] in [place]. She is resting in bed at this time She is now Comfort care and wants to got to [facility] in [place], I notified Santiago Glad She said that she is ready to go be with her late husband. She stated her left foot was hurting, I placed her foot on a pillow and asked if she would like me to get her bedside nurse, she stated that she did not. 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 She became tired while we were talking and I left her room so that she could rest, Lelon Frohlich is still at bedside  Expected Discharge Plan: [place] Barriers to Discharge: Hospice Bed not available   Patient Goals and CMS Choice Patient states their goals for this hospitalization and ongoing recovery are:: go to hospice CMS Medicare.gov Compare Post Acute Care list provided to:: Patient Choice offered to / list presented to : Patient, [facility] POA / Guardian  Expected Discharge Plan and Services Expected Discharge Plan: [place]   Discharge Planning Services: CM Consult, Other - See comment([facility]) Post Acute Care Choice: Hospice Living arrangements for the past 2 months: [facility]                 DME Arranged: N/A         HH Arranged: NA          Prior Living Arrangements/Services Living arrangements for the past 2 months: [place] Lives with:: Facility Resident Patient language and need for  interpreter reviewed:: Yes        Need for Family Participation in Patient Care: No (Comment) Care giver support system in place?: Yes (comment)   Criminal Activity/Legal Involvement Pertinent to Current Situation/Hospitalization: No - Comment as needed  Activities of Daily Living      Permission Sought/Granted   Permission granted to share information with : Yes, Verbal Permission Granted              Emotional Assessment Appearance:: Appears stated age Attitude/Demeanor/Rapport: Engaged, Other (comment) Affect (typically observed): Appropriate Orientation: : Oriented to Self, Oriented to Place, Oriented to  Time, Oriented to Situation Alcohol / Substance Use: Not Applicable Psych Involvement: No (comment)  Admission diagnosis:  Rectal bleeding [K62.5] Atrial fibrillation, unspecified type ([place]) [I48.91] Pressure injury of sacral region, stage 3 ([place]) [L89.153] Patient Active Problem List   Diagnosis Date Noted  . Sacral ulcer, with necrosis of muscle ([place]) 03/10/2019  . Venous ulcer of left leg ([place]) 07/09/2018  . Venous ulcer of right leg ([place]) 06/10/2018  . Lower extremity pain, bilateral 11/20/2017  . Blister 10/31/2017  . Chest pain 09/06/2016  . Lower extremity edema 07/31/2016  . Diabetes ([place]) 04/03/2016  . Lymphedema 04/03/2016  . Swelling of limb 04/03/2016  . Hypertension 05/29/1986   PCP:  Lenard Simmer, MD Pharmacy:   [facility] -  [address], [place] [address] Phone: [phone] Fax: [phone]     Social Determinants of Health (SDOH) Interventions    Readmission Risk Interventions No flowsheet data found.

## 2019-03-12 NOTE — Progress Notes (Signed)
Received new orders from Dr Jannifer [place] to discontinue cardiac monitoring and Nurse may pronounce

## 2019-03-12 NOTE — Consult Note (Addendum)
Consultation Note Date: 03/12/2019   Patient Name: Debra Butler  DOB: 02/26/1922  MRN: [id number]  Age / Sex: 85 y.o., female  PCP: Lenard Simmer, MD Referring Physician: [name], *  Reason for Consultation: Establishing goals of care  HPI/Patient Profile: 85 y.o. female with pertinent past medical history of diabetes mellitus, hypertension, IBS, MI, bilateral lower extremity lymphedema, venous stasis ulcers of bilateral lower legs, traumatic hematoma of buttock now with sacral abscess presenting to the ED due to a large amount of rectal bleeding and pain in her sacral decubitus wound.  Clinical Assessment and Goals of Care: Patient is resting in bed, has been lethargic, and does not participate in goals of care conversation. Her HPOA is at bedside. Debra Butler has not been eating well, and has not eaten today. She has wound debridement planned. Her POA Debra Butler states the patient has told her she is tired and ready to go home and see her husband. Her husband is deceased. Debra Butler does not want Debra Butler to suffer, and wants to be sure she is honoring her wishes. We read her living will together, and a copy is in the chart in addition to a durable POA form. Discussed need for HPOA papers as Debra Butler does have living relatives. Her will discusses "meaningful life" in addition to other medical situations.   Debra Butler returned to bedside with HPOA papers naming first her as HPOA, and then also a gentleman named  Debra Butler as HPOA. Upon entering room, patient was talking to Debra Butler about allocating assets such as guns, knives, and jewelry. They were discussing funeral arrangements such as open vs closed casket as well. Debra Butler states to me and Debra Butler that she is tired and ready to go home. Upon clarification, she said heaven. She states "I don't want anything else done to me, I just want to be comfortable until the Lord  takes me home." She complains of foot pain and tells Debra Butler she has tried her hardest.   I completed a MOST form today and the signed original was placed in the chart. A photocopy was placed in the chart to be scanned into EMR. The patient outlined their wishes for the following treatment decisions:  Cardiopulmonary Resuscitation: Do Not Attempt Resuscitation (DNR/No CPR)  Medical Interventions: Comfort Measures: Keep clean, warm, and dry. Use medication by any route, positioning, wound care, and other measures to relieve pain and suffering. Use oxygen, suction and manual treatment of airway obstruction as needed for comfort. Do not transfer to the hospital unless comfort needs cannot be met in current location.  Antibiotics: No antibiotics (use other measures to relieve symptoms)  IV Fluids: No IV fluids (provide other measures to ensure comfort)  Feeding Tube: No feeding tube     The form was signed by Debra Butler as patient has macular degeneration and states she cannot see, even with her glasses which HPOA offered her.      SUMMARY OF RECOMMENDATIONS   Shift to comfort care. Hospice facility recommended.  Oxycodone PRN for moderate pain or dyspnea Morphine PRN for severe or breakthrough pain or dyspnea Ativan PRN for anxiety Haldol PRN for agitation Robinul PRN for excessive secretions.   Prognosis:   < 2 weeks Bites and sips. Does not want wound debridement. Does not want life prolonging interventions. HGB is drifting down. Elevated creatinine with mantainance IV fluids.   Discharge Planning: Hospice facility      Primary Diagnoses: Present on Admission:  Sacral ulcer, with necrosis of muscle ([place])   I have reviewed the medical record, interviewed the patient and family, and examined the patient. The following aspects are pertinent.  Past Medical History:  Diagnosis Date   Arthritis    Blood clot in vein 2013   left leg   Diabetes mellitus without complication ([place])  1443   Hypertension 1988   IBS (irritable bowel syndrome)    Myocardial infarction [facility])    Social History   Socioeconomic History   Marital status: Married    Spouse name: Not on file   Number of children: Not on file   Years of education: Not on file   Highest education level: Not on file  Occupational History   Not on file  Social Needs   Financial resource strain: Not on file   Food insecurity    Worry: Not on file    Inability: Not on file   Transportation needs    Medical: Not on file    Non-medical: Not on file  Tobacco Use   Smoking status: Never Smoker   Smokeless tobacco: Never Used  Substance and Sexual Activity   Alcohol use: No   Drug use: No   Sexual activity: Not on file  Lifestyle   Physical activity    Days per week: Not on file    Minutes per session: Not on file   Stress: Not on file  Relationships   Social connections    Talks on phone: Not on file    Gets together: Not on file    Attends religious service: Not on file    Active member of club or organization: Not on file    Attends meetings of clubs or organizations: Not on file    Relationship status: Not on file  Other Topics Concern   Not on file  Social History Narrative   Not on file   History reviewed. No pertinent family history. Scheduled Meds:  cholecalciferol  1,000 Units Oral Daily   clorazepate  3.75 mg Oral Daily   [START ON 03/28/2019] cyanocobalamin  1,000 mcg Intramuscular Q30 days   diltiazem  240 mg Oral Daily   feeding supplement (ENSURE ENLIVE)  237 mL Oral BID BM   insulin aspart  0-5 Units Subcutaneous QHS   insulin aspart  0-9 Units Subcutaneous TID WC   levothyroxine  112 mcg Oral QAC breakfast   multivitamin with minerals  1 tablet Oral Daily   pregabalin  75 mg Oral BID   vancomycin variable dose per unstable renal function (pharmacist dosing)   Does not apply See admin instructions   vitamin C  500 mg Oral BID   Continuous  Infusions:  sodium chloride Stopped (03/12/19 0318)   cefTRIAXone (ROCEPHIN)  IV Stopped (03/11/19 1832)   metronidazole Stopped (03/12/19 0701)   vancomycin 1,000 mg (03/12/19 1237)   PRN Meds:.acetaminophen, nitroGLYCERIN, phenylephrine-shark liver oil-mineral oil-petrolatum Medications Prior to Admission:  Prior to Admission medications   Medication Sig Start Date End Date Taking? Authorizing Provider  aspirin 81  MG tablet Take 81 mg by mouth daily.   Yes [provider]  cholecalciferol (VITAMIN D) 1000 units tablet Take 1,000 Units by mouth daily.   Yes [provider]  clorazepate (TRANXENE) 3.75 MG tablet Take 3.75 mg by mouth daily. 12/20/18  Yes [provider]  cyanocobalamin (,VITAMIN B-12,) 1000 MCG/ML injection Inject 1,000 mcg into the muscle every 30 (thirty) days.   Yes [provider]  diltiazem (DILACOR XR) 180 MG 24 hr capsule Take 180 mg by mouth daily.   Yes [provider]  furosemide (LASIX) 20 MG tablet Take 40 mg by mouth 2 (two) times daily.   Yes [provider]  glipiZIDE (GLUCOTROL XL) 5 MG 24 hr tablet Take 5 mg by mouth daily.   Yes [provider]  levothyroxine (SYNTHROID, LEVOTHROID) 112 MCG tablet Take 112 mcg by mouth daily before breakfast.   Yes [provider]  metFORMIN (GLUCOPHAGE) 500 MG tablet Take 500 mg by mouth 2 (two) times daily with a meal.   Yes [provider]  potassium chloride SA (K-DUR,KLOR-CON) 20 MEQ tablet Take 20 mEq by mouth 2 (two) times daily.   Yes [provider]  pregabalin (LYRICA) 100 MG capsule Take 100 mg by mouth 2 (two) times daily.   Yes [provider]  warfarin (COUMADIN) 5 MG tablet Take 5 mg by mouth daily with supper.    Yes [provider]  nitroGLYCERIN (NITROSTAT) 0.4 MG SL tablet Place 0.4 mg under the tongue every 5 (five) minutes as needed for chest pain.    [provider]    Pramox-PE-Glycerin-Petrolatum (PREPARATION H) 1-0.25-14.4-15 % CREA Place 1 application rectally 3 (three) times daily as needed (for rectal discomfort/hemmorroids.).    [provider]   Allergies  Allergen Reactions   Sulphur [Sulfur] Hives    And seizure   Ammonia    Phenergan [Promethazine] Other (See Comments)    Altered mental status   Review of Systems  Musculoskeletal:       Foot pain.     Physical Exam Pulmonary:     Effort: Pulmonary effort is normal.  Skin:    General: Skin is warm and dry.  Neurological:     Mental Status: She is alert.     Vital Signs: BP 113/77 (BP Location: Left Arm)    Pulse 92    Temp 97.8 F (36.6 C) (Oral)    Resp 19    Ht _0  (1.626 m)    Wt 88.3 kg    SpO2 99%    BMI 33.41 kg/m  Pain Scale: PAINAD   Pain Score: Asleep   SpO2: SpO2: 99 % O2 Device:SpO2: 99 % O2 Flow Rate: .O2 Flow Rate (L/min): 2 L/min  IO: Intake/output summary:   Intake/Output Summary (Last 24 hours) at 03/12/2019 1240 Last data filed at 03/12/2019 0606 Gross per 24 hour  Intake 1568.41 ml  Output 400 ml  Net 1168.41 ml    LBM: Last BM Date: 03/11/19 Baseline Weight: Weight: 91.2 kg Most recent weight: Weight: 88.3 kg     Palliative Assessment/Data: 20%     Time In/Out: 11:00-12:00, 1:00-1:50 Time Total: 110 min Greater than 50%  of this time was spent counseling and coordinating care related to the above assessment and plan.  Signed by: Asencion Gowda, NP   Please contact Palliative Medicine Team phone at [phone] for questions and concerns.  For individual provider: See Shea Evans

## 2019-03-12 NOTE — Progress Notes (Signed)
Patient Debra Butler went up to a 5. Dr. Was on the floor and orders given, will continue to monitor.

## 2019-03-12 NOTE — Progress Notes (Signed)
Pt is very lethargic and difficult to wake up. MD notified via epic chat. Per night shift report, pt has been difficult to wake up and lethargic. Palliative consult in and a family discussion is to be held today to determine if family would like comfort care. Awaiting response from MD

## 2019-03-12 NOTE — Progress Notes (Signed)
PT Cancellation Note  Patient Details Name: Debra Butler MRN: [id number] DOB: May 20, 1922   Cancelled Treatment:    Reason Eval/Treat Not Completed: (PT orders discontinued at this time, PT to sign off.)  Lieutenant Diego PT, DPT 2:25 PM,03/12/19 [phone]

## 2019-03-12 NOTE — Progress Notes (Signed)
Pt resting in bed. Alert and awake. Pt asked how she feels by this RN. Pt states "I feel like a person dying". Pt alert and oriented x4. Pt refused PO medications at this time.

## 2019-03-12 NOTE — Progress Notes (Signed)
New referral for [name] received from [facility].  Deliliah made aware that there are currently no beds available at the hospice home.  Patient information gathered and faxed to referral. Will continue to follow and update hospital care team and patient/family regarding bed availability. Flo Shanks BSN, RN, [facility] [phone]

## 2019-03-13 NOTE — Progress Notes (Signed)
Patient turned and repositioned Q 2 hours , PRN oxycodone effective in controlling pain .

## 2019-03-13 NOTE — Progress Notes (Signed)
Visit made to new referral for [name] hosp[facility] home. Patient seen lying in bed, alert, friend Lelon Frohlich at bedside.Patient had taken a few bites of her eggs and drank her milk, she needs assistance with feeding. Patient did not take her oral medications this morning and did require a PRN dose of IV morphine overnight for pain.   She is oriented, but weak voiced. She denied pain, but did call out when her right leg was repositioned. Writer was able to initiate basic education regarding hospice home services, philosophy and team approach to care with understanding voiced.  Debra Butler, patient and hospital care team all aware that there is currently no bed available at the hospice home for transfer today. Will continue to follow.  Flo Shanks BSN, RN, [employer] [phone]

## 2019-03-13 NOTE — Progress Notes (Signed)
Daily Progress Note   Patient Name: Debra Butler       Date: 03/13/2019 DOB: 1921/12/28  Age: 85 y.o. MRN#: [id number] Attending Physician: [name], * Primary Care Physician: Lenard Simmer, MD Admit Date: 03/10/2019  Reason for Consultation/Follow-up: Terminal Care  Subjective: Patient is resting in bed with eyes closed. Her HPOA is at bedside. Debra Butler has no complaints. She states she received pain medication overnight, and it made her nauseated. She states Zofran does control her nausea. Very poor oral intake.   Length of Stay: 3  Current Medications: Scheduled Meds:  . clorazepate  3.75 mg Oral Daily  . diltiazem  240 mg Oral Daily  . feeding supplement (ENSURE ENLIVE)  237 mL Oral BID BM  . levothyroxine  112 mcg Oral QAC breakfast  . pregabalin  75 mg Oral BID  . sodium chloride flush  3 mL Intravenous Q12H    Continuous Infusions:   PRN Meds: antiseptic oral rinse, glycopyrrolate **OR** glycopyrrolate **OR** glycopyrrolate, haloperidol **OR** haloperidol **OR** haloperidol lactate, LORazepam **OR** LORazepam **OR** LORazepam, morphine injection, nitroGLYCERIN, ondansetron **OR** ondansetron (ZOFRAN) IV, oxyCODONE, phenylephrine-shark liver oil-mineral oil-petrolatum, polyvinyl alcohol, sodium chloride flush  Physical Exam Pulmonary:     Effort: Pulmonary effort is normal.  Neurological:     Mental Status: She is alert.             Vital Signs: BP 109/73 (BP Location: Right Arm)   Pulse 92   Temp 98 F (36.7 C) (Oral)   Resp 15   Ht 5\' 4"  (1.626 m)   Wt 88.3 kg   SpO2 99%   BMI 33.41 kg/m  SpO2: SpO2: 99 % O2 Device: O2 Device: Nasal Cannula O2 Flow Rate: O2 Flow Rate (L/min): 2 L/min  Intake/output summary:   Intake/Output Summary  (Last 24 hours) at 03/13/2019 1254 Last data filed at 03/13/2019 0618 Gross per 24 hour  Intake -  Output 1050 ml  Net -1050 ml   LBM: Last BM Date: 03/11/19 Baseline Weight: Weight: 91.2 kg Most recent weight: Weight: 88.3 kg       Palliative Assessment/Data: 30%      Patient Active Problem List   Diagnosis Date Noted  . Sacral ulcer, with necrosis of muscle ([place]) 03/10/2019  . Venous ulcer of left leg ([place]) 07/09/2018  .  Venous ulcer of right leg ([place]) 06/10/2018  . Lower extremity pain, bilateral 11/20/2017  . Blister 10/31/2017  . Chest pain 09/06/2016  . Lower extremity edema 07/31/2016  . Diabetes ([place]) 04/03/2016  . Lymphedema 04/03/2016  . Swelling of limb 04/03/2016  . Hypertension 05/29/1986    Palliative Care Assessment & Plan    Recommendations/Plan:  Comfort care. Waiting for hospice facility.    Code Status:    Code Status Orders  (From admission, onward)         Start     Ordered   03/12/19 1406  Do not attempt resuscitation (DNR)  Continuous    Question Answer Comment  In the event of cardiac or respiratory ARREST Do not call a "code blue"   In the event of cardiac or respiratory ARREST Do not perform Intubation, CPR, defibrillation or ACLS   In the event of cardiac or respiratory ARREST Use medication by any route, position, wound care, and other measures to relive pain and suffering. May use oxygen, suction and manual treatment of airway obstruction as needed for comfort.   Comments MOST form in chart      03/12/19 1420        Code Status History    Date Active Date Inactive Code Status Order ID Comments User Context   03/10/2019 2245 03/12/2019 1420 DNR [id number]  Lang Snow, NP ED   09/06/2016 1850 09/08/2016 2020 Partial Code [id number]  Demetrios Loll, MD Inpatient   Advance Care Planning Activity    Advance Directive Documentation     Most Recent Value  Type of Advance Directive  Living will, Healthcare Power of  Attorney  Pre-existing out of facility DNR order (yellow form or pink MOST form)  -  "MOST" Form in Place?  -       Prognosis:   < 2 weeks  Discharge Planning:  Hospice facility   Thank you for allowing the Palliative Medicine Team to assist in the care of this patient.   Total Time 25 min Prolonged Time Billed no      Greater than 50%  of this time was spent counseling and coordinating care related to the above assessment and plan.  Asencion Gowda, NP  Please contact Palliative Medicine Team phone at [phone] for questions and concerns.

## 2019-03-13 NOTE — TOC Progression Note (Signed)
Transition of Care [facility]) - Progression Note    Patient Details  Name: Debra Butler MRN: [id number] Date of Birth: 08-Jan-1922  Transition of Care [facility]) CM/SW Contact  Arsal Tappan, Lenice Llamas Phone Number: [phone]  03/13/2019, 11:09 AM  Clinical Narrative: Per [facility] hospice liaison there are no hospice beds in [place] today.       Expected Discharge Plan: [place] Barriers to Discharge: Hospice Bed not available  Expected Discharge Plan and Services Expected Discharge Plan: [place]   Discharge Planning Services: CM Consult, Other - See comment([facility]) Post Acute Care Choice: Hospice Living arrangements for the past 2 months: Assisted Living Facility                 DME Arranged: N/A         HH Arranged: NA           Social Determinants of Health (SDOH) Interventions    Readmission Risk Interventions Readmission Risk Prevention Plan 03/12/2019  Transportation Screening Complete  PCP or Specialist Appt within 3-5 Days Complete  HRI or [place] Complete  Social Work Consult for [place] Planning/Counseling Complete  Palliative Care Screening Complete  Medication Review Press [employer]) Referral to Pharmacy  Some recent data might be hidden

## 2019-03-13 NOTE — Care Management Important Message (Signed)
Important Message  Patient Details  Name: Debra Butler MRN: [id number] Date of Birth: December 23, 1921   Medicare Important Message Given:  Yes     [name] 03/13/2019, 12:14 PM

## 2019-03-13 NOTE — TOC Transition Note (Signed)
Transition of Care [facility]) - CM/SW Discharge Note   Patient Details  Name: Debra Butler MRN: [id number] Date of Birth: 13-Apr-1922  Transition of Care [facility]) CM/SW Contact:  Su Hilt, RN Phone Number: 03/13/2019, 1:27 PM   Clinical Narrative:     Continue to monitor for needs There are no Hospice beds available at this time    Barriers to Discharge: Hospice Bed not available   Patient Goals and CMS Choice Patient states their goals for this hospitalization and ongoing recovery are:: go to hospice CMS Medicare.gov Compare Post Acute Care list provided to:: Patient Choice offered to / list presented to : Patient, [facility] POA / [place]  Discharge Placement                       Discharge Plan and Services   Discharge Planning Services: CM Consult, Other - See comment([facility]) Post Acute Care Choice: Hospice          DME Arranged: N/A         HH Arranged: NA          Social Determinants of Health (SDOH) Interventions     Readmission Risk Interventions Readmission Risk Prevention Plan 03/12/2019  Transportation Screening Complete  PCP or Specialist Appt within 3-5 Days Complete  HRI or [place] Complete  Social Work Consult for [place] Planning/Counseling Complete  Palliative Care Screening Complete  Medication Review Press photographer) Referral to Pharmacy  Some recent data might be hidden

## 2019-03-13 NOTE — Progress Notes (Signed)
[place] at [place] NAME: Debra Butler    MR#:  [id number]  DATE OF BIRTH:  02/11/1922  SUBJECTIVE:  CHIEF COMPLAINT:   Chief Complaint  Patient presents with  . GI Bleeding   Sent from her assisted living place for episode of GI bleed.  Hemoglobin is stable.  She has been going downhill and her strength for last few weeks.  She noted to have sacral decubitus ulcer which seems to be infected.  She had low-grade fever this morning.  Patient was more drowsy and not able to take any oral medication or food today.Did not wake up when I was in room with gentle tap.  REVIEW OF SYSTEMS:   Due to altered mental status and drowsiness patient cannot give reliable review of system. ROS  DRUG ALLERGIES:   Allergies  Allergen Reactions  . Sulphur [Sulfur] Hives    And seizure  . Ammonia   . Phenergan [Promethazine] Other (See Comments)    Altered mental status    VITALS:  Blood pressure 109/73, pulse 92, temperature 98 F (36.7 C), temperature source Oral, resp. rate 15, height 5\' 4"  (1.626 m), weight 88.3 kg, SpO2 99 %.  PHYSICAL EXAMINATION:   GENERAL:  85 y.o.-year-old patient lying in the bed with no acute distress.  EYES: Pupils equal, round, reactive to light and accommodation. No scleral icterus. Extraocular muscles intact.  HEENT: Head atraumatic, normocephalic. Oropharynx and nasopharynx clear.  NECK: Supple, no jugular venous distention. No thyroid enlargement, no tenderness.  LUNGS: Normal breath sounds bilaterally, no wheezing, rales,rhonchi or crepitation. No use of accessory muscles of respiration.  CARDIOVASCULAR: S1, S2 normal. No murmurs, rubs, or gallops.  ABDOMEN: Soft, nontender, nondistended. Bowel sounds present. No organomegaly or mass.  EXTREMITIES: No pedal edema, cyanosis, or clubbing.  NEUROLOGIC: Patient is drowsy and barely arousable cannot give review of system or cannot exam for strength and she does not follow  command. PSYCHIATRIC: The patient is drowsy today. SKIN: Multiple pressure ulcers, venous statis and skin breakdown   Physical Exam LABORATORY PANEL:   CBC Recent Labs  Lab 03/12/19 0424  WBC 12.2*  HGB 7.8*  HCT 23.9*  PLT 162   ------------------------------------------------------------------------------------------------------------------  Chemistries  Recent Labs  Lab 03/10/19 1204  03/12/19 0424  NA 136   < > 140  K 3.2*   < > 2.9*  CL 102   < > 102  CO2 27   < > 24  GLUCOSE 142*   < > 167*  BUN 26*   < > 27*  CREATININE 1.55*   < > 1.67*  CALCIUM 7.3*   < > 7.9*  MG  --    < > 2.2  AST 19  --   --   ALT 7  --   --   ALKPHOS 51  --   --   BILITOT 1.4*  --   --    < > = values in this interval not displayed.   ------------------------------------------------------------------------------------------------------------------  Cardiac Enzymes No results for input(s): TROPONINI in the last 168 hours. ------------------------------------------------------------------------------------------------------------------  RADIOLOGY:  No results found.  ASSESSMENT AND PLAN:   Active Problems:   Sacral ulcer, with necrosis of muscle ([place])  85 y.o. female  with pertinent past medical history of diabetes mellitus, hypertension, atrial fibrillation, IBS, MI, bilateral lower extremity lymphedema, venous stasis ulcers of bilateral lower legs, traumatic hematoma of buttock now with sacral abscess presenting to the ED due to a large  amount of rectal bleeding and pain in her sacral decubitus wound.  1. Anemia due to blood loss from sacral decubitus ulcers - No other sources of bleeding identified - Hemoglobin drop 9.7-8.5 - H&H monitoring q6h - Hold NSAIDs, steroids, ASA - Hold Coumadin for now may need to restart if hemoglobin stable -Because of old age, I would like to hold on GI consult or further work-up at this time. -Hemoglobin dropped some further but patient is  now comfort care after meeting with palliative care and POA.  2. Left sacral decubitus ulcers with necrosis - CT suggests proctitis as well as some cellulitis surrounding his sacral wound - Blood cultures pending - Started empiric antibiotics - We had called general surgery consult but patient's POA had decided to make her comfort care and no aggressive or surgical approach so no further needs.  3. Paroxysmal atrial fibrillation-rate controlled on diltiazem - Hold Coumadin for now  4. Hypokalemia - Replete with IV potassium - Check mag - Comfort care and awaited for hospice now.  5. Diabetes mellitus - Hold glipizide and Metformin - SSI  6. DVT prophylaxis - Hold anti-coagulation for suspect bleeding  7.  Functional decline I had a long discussion with her POA, as per her for last few months patient has gradual decline.  She was transferred to assisted living place 1-1/2 months ago but continued to have significant decline in her functional status and stays mostly in the bed and developed ulcer.  She also does not eat good now. I informed her that usually this type of things at old age is not very good signs or good prognosis.  This could lead into continued worsening and patient may need long-term nursing home placement.    After meeting with palliative care, POA agreed on comfort measures and hospice placement.  All the records are reviewed and case discussed with Care Management/Social Workerr. Management plans discussed with the patient, family and they are in agreement.  CODE STATUS: DNR  TOTAL TIME TAKING CARE OF THIS PATIENT: 35 minutes.   Awaited Hospice placement.  POSSIBLE D/C IN 2-3 DAYS, DEPENDING ON CLINICAL CONDITION.   Debra Butler M.D on 03/13/2019   Between 7am to 6pm - Pager - [phone]  After 6pm go to [email or web address] - password [email or web address] [facility]  Office  [phone]  CC: Primary care physician; Lenard Simmer, MD  Note: This dictation was prepared with Dragon dictation along with smaller phrase technology. Any transcriptional errors that result from this process are unintentional.

## 2019-03-14 MED ORDER — OXYCODONE HCL 5 MG PO TABS
5.0000 mg | ORAL_TABLET | ORAL | Status: DC | PRN
  Administered 2019-03-14 – 2019-03-18 (×5): 5 mg via ORAL
  Filled 2019-03-14 (×5): qty 1

## 2019-03-14 NOTE — Progress Notes (Signed)
[place] at [place] NAME: Debra Butler    MR#:  [id number]  DATE OF BIRTH:  1922/02/20  SUBJECTIVE:  CHIEF COMPLAINT:   Chief Complaint  Patient presents with  . GI Bleeding   Sent from her assisted living place for episode of GI bleed.  Hemoglobin is stable.  She has been going downhill and her strength for last few weeks.  She noted to have sacral decubitus ulcer which seems to be infected.  She is on comfort care.  She was awake today and without any discomfort.  REVIEW OF SYSTEMS:   Due to altered mental status and drowsiness patient cannot give reliable review of system. ROS  DRUG ALLERGIES:   Allergies  Allergen Reactions  . Sulphur [Sulfur] Hives    And seizure  . Ammonia   . Phenergan [Promethazine] Other (See Comments)    Altered mental status    VITALS:  Blood pressure 117/78, pulse 85, temperature 97.6 F (36.4 C), temperature source Axillary, resp. rate 18, height 5\' 4"  (1.626 m), weight 88.3 kg, SpO2 100 %.  PHYSICAL EXAMINATION:   GENERAL:  85 y.o.-year-old patient lying in the bed with no acute distress.  EYES: Pupils equal, round, reactive to light and accommodation. No scleral icterus. Extraocular muscles intact.  HEENT: Head atraumatic, normocephalic. Oropharynx and nasopharynx clear.  NECK: Supple, no jugular venous distention. No thyroid enlargement, no tenderness.  LUNGS: Normal breath sounds bilaterally, no wheezing, rales,rhonchi or crepitation. No use of accessory muscles of respiration.  CARDIOVASCULAR: S1, S2 normal. No murmurs, rubs, or gallops.  ABDOMEN: Soft, nontender, nondistended. Bowel sounds present. No organomegaly or mass.  EXTREMITIES: No pedal edema, cyanosis, or clubbing.  NEUROLOGIC: Patient is alert cannot give review of system or cannot exam for strength and she does not follow command. PSYCHIATRIC: The patient is alert for minimal communication today. SKIN: Multiple pressure ulcers,  venous statis and skin breakdown   Physical Exam LABORATORY PANEL:   CBC Recent Labs  Lab 03/12/19 0424  WBC 12.2*  HGB 7.8*  HCT 23.9*  PLT 162   ------------------------------------------------------------------------------------------------------------------  Chemistries  Recent Labs  Lab 03/10/19 1204  03/12/19 0424  NA 136   < > 140  K 3.2*   < > 2.9*  CL 102   < > 102  CO2 27   < > 24  GLUCOSE 142*   < > 167*  BUN 26*   < > 27*  CREATININE 1.55*   < > 1.67*  CALCIUM 7.3*   < > 7.9*  MG  --    < > 2.2  AST 19  --   --   ALT 7  --   --   ALKPHOS 51  --   --   BILITOT 1.4*  --   --    < > = values in this interval not displayed.   ------------------------------------------------------------------------------------------------------------------  Cardiac Enzymes No results for input(s): TROPONINI in the last 168 hours. ------------------------------------------------------------------------------------------------------------------  RADIOLOGY:  No results found.  ASSESSMENT AND PLAN:   Active Problems:   Sacral ulcer, with necrosis of muscle ([place])  85 y.o. female  with pertinent past medical history of diabetes mellitus, hypertension, atrial fibrillation, IBS, MI, bilateral lower extremity lymphedema, venous stasis ulcers of bilateral lower legs, traumatic hematoma of buttock now with sacral abscess presenting to the ED due to a large amount of rectal bleeding and pain in her sacral decubitus wound.  1. Anemia due to blood loss from  sacral decubitus ulcers - No other sources of bleeding identified - Hemoglobin drop 9.7-8.5 - H&H monitoring q6h - Hold NSAIDs, steroids, ASA - Hold Coumadin for now  - Because of old age, I would like to hold on GI consult or further work-up at this time. -Hemoglobin dropped some further but patient is now comfort care after meeting with palliative care and POA.  2. Left sacral decubitus ulcers with necrosis - CT suggests  proctitis as well as some cellulitis surrounding his sacral wound - Blood cultures pending - Started empiric antibiotics - We had called general surgery consult but patient's POA had decided to make her comfort care and no aggressive or surgical approach so no further needs.  3. Paroxysmal atrial fibrillation-rate controlled on diltiazem - Hold Coumadin for now  4. Hypokalemia - Replete with IV potassium - Check mag - Comfort care and awaited for hospice now.  5. Diabetes mellitus - Hold glipizide and Metformin - SSI  6. DVT prophylaxis - Hold anti-coagulation for suspect bleeding  7.  Functional decline After meeting with palliative care, POA agreed on comfort measures and hospice placement.  All the records are reviewed and case discussed with Care Management/Social Workerr. Management plans discussed with the patient, family and they are in agreement.  CODE STATUS: DNR  TOTAL TIME TAKING CARE OF THIS PATIENT: 25 minutes.  No beds in Hospice home.   POSSIBLE D/C IN 1-2 DAYS, DEPENDING ON CLINICAL CONDITION.   [name] M.D on 03/14/2019   Between 7am to 6pm - Pager - [phone]  After 6pm go to [email or web address] - password [email or web address] [facility] [facility]  Office  [phone]  CC: Primary care physician; Lenard Simmer, MD  Note: This dictation was prepared with Dragon dictation along with smaller phrase technology. Any transcriptional errors that result from this process are unintentional.

## 2019-03-14 NOTE — Progress Notes (Signed)
Follow up visit made to new referral for [name] hosp[facility] home. Patient seen lying in bed, appeared to be sleeping, close friend Butch at bedside. Patient did awaken to gentle tactile stimuli and voice and quietly interact with Butch. Butch and hospital care team made aware there is currently no bed available for transfer today. Flo Shanks BSN, RN, [facility] [phone]

## 2019-03-15 LAB — AEROBIC CULTURE W GRAM STAIN (SUPERFICIAL SPECIMEN)

## 2019-03-15 MED ORDER — METOPROLOL TARTRATE 5 MG/5ML IV SOLN: 5.0000 mg | Freq: Four times a day (QID) | INTRAVENOUS | Status: DC | PRN

## 2019-03-15 NOTE — Progress Notes (Signed)
[facility] [place] NAME: Debra Butler    MR#:  [id number]  DATE OF BIRTH:  08/26/1921  SUBJECTIVE:  CHIEF COMPLAINT:   Chief Complaint  Patient presents with  . GI Bleeding   Sent from her assisted living place for episode of GI bleed.  Hemoglobin is stable.  She has been going downhill and her strength for last few weeks.  She noted to have sacral decubitus ulcer which seems to be infected.  She is on comfort care.  She was awake today and without any discomfort. Confused.  REVIEW OF SYSTEMS:   Due to altered mental status and drowsiness patient cannot give reliable review of system. ROS  DRUG ALLERGIES:   Allergies  Allergen Reactions  . Sulphur [Sulfur] Hives    And seizure  . Ammonia   . Phenergan [Promethazine] Other (See Comments)    Altered mental status    VITALS:  Blood pressure 101/71, pulse (!) 55, temperature 97.9 F (36.6 C), temperature source Oral, resp. rate 16, height 5\' 4"  (1.626 m), weight 88.3 kg, SpO2 100 %.  PHYSICAL EXAMINATION:   GENERAL:  85 y.o.-year-old patient lying in the bed with no acute distress.  EYES: Pupils equal, round, reactive to light and accommodation. No scleral icterus. Extraocular muscles intact.  HEENT: Head atraumatic, normocephalic. Oropharynx and nasopharynx clear.  NECK: Supple, no jugular venous distention. No thyroid enlargement, no tenderness.  LUNGS: Normal breath sounds bilaterally, no wheezing, rales,rhonchi or crepitation. No use of accessory muscles of respiration.  CARDIOVASCULAR: S1, S2 normal. No murmurs, rubs, or gallops.  ABDOMEN: Soft, nontender, nondistended. Bowel sounds present. No organomegaly or mass.  EXTREMITIES: No pedal edema, cyanosis, or clubbing.  NEUROLOGIC: Patient is alert cannot give review of system or cannot exam for strength and she does not follow command. PSYCHIATRIC: The patient is alert for minimal communication today. SKIN: Multiple pressure  ulcers, venous statis and skin breakdown   Physical Exam LABORATORY PANEL:   CBC Recent Labs  Lab 03/12/19 0424  WBC 12.2*  HGB 7.8*  HCT 23.9*  PLT 162   ------------------------------------------------------------------------------------------------------------------  Chemistries  Recent Labs  Lab 03/10/19 1204  03/12/19 0424  NA 136   < > 140  K 3.2*   < > 2.9*  CL 102   < > 102  CO2 27   < > 24  GLUCOSE 142*   < > 167*  BUN 26*   < > 27*  CREATININE 1.55*   < > 1.67*  CALCIUM 7.3*   < > 7.9*  MG  --    < > 2.2  AST 19  --   --   ALT 7  --   --   ALKPHOS 51  --   --   BILITOT 1.4*  --   --    < > = values in this interval not displayed.   ------------------------------------------------------------------------------------------------------------------  Cardiac Enzymes No results for input(s): TROPONINI in the last 168 hours. ------------------------------------------------------------------------------------------------------------------  RADIOLOGY:  No results found.  ASSESSMENT AND PLAN:   Active Problems:   Sacral ulcer, with necrosis of muscle ([place])  85 y.o. female  with pertinent past medical history of diabetes mellitus, hypertension, atrial fibrillation, IBS, MI, bilateral lower extremity lymphedema, venous stasis ulcers of bilateral lower legs, traumatic hematoma of buttock now with sacral abscess presenting to the ED due to a large amount of rectal bleeding and pain in her sacral decubitus wound.  1. Anemia due to blood  loss from sacral decubitus ulcers - No other sources of bleeding identified - Hemoglobin drop 9.7-8.5 - H&H monitoring q6h - Hold NSAIDs, steroids, ASA - Hold Coumadin for now  - Because of old age, I would like to hold on GI consult or further work-up at this time. -Hemoglobin dropped some further but patient is now comfort care after meeting with palliative care and POA.  2. Left sacral decubitus ulcers with necrosis - CT  suggests proctitis as well as some cellulitis surrounding his sacral wound - Blood cultures pending - Started empiric antibiotics - We had called general surgery consult but patient's POA had decided to make her comfort care and no aggressive or surgical approach so no further needs.  3. Paroxysmal atrial fibrillation-rate controlled on diltiazem - Hold Coumadin for now  4. Hypokalemia - Replete with IV potassium - Check mag - Comfort care and awaited for hospice now.  5. Diabetes mellitus - Hold glipizide and Metformin - SSI  6. DVT prophylaxis - Hold anti-coagulation for suspect bleeding  7.  Functional decline After meeting with palliative care, POA agreed on comfort measures and hospice placement.  All the records are reviewed and case discussed with Care Management/Social Workerr. Management plans discussed with the patient, family and they are in agreement.  CODE STATUS: DNR  TOTAL TIME TAKING CARE OF THIS PATIENT: 25 minutes.  No beds in Hospice home. Waiting.   POSSIBLE D/C IN 1-2 DAYS, DEPENDING ON CLINICAL CONDITION.   [name] M.D on 03/15/2019   Between 7am to 6pm - Pager - [phone]  After 6pm go to [email or web address] - password [email or web address] [facility]  Office  [phone]  CC: Primary care physician; Lenard Simmer, MD  Note: This dictation was prepared with Dragon dictation along with smaller phrase technology. Any transcriptional errors that result from this process are unintentional.

## 2019-03-16 LAB — CULTURE, BLOOD (ROUTINE X 2)
Culture: NO GROWTH
Culture: NO GROWTH
Special Requests: ADEQUATE
Special Requests: ADEQUATE

## 2019-03-16 NOTE — Progress Notes (Signed)
[facility] [place] NAME: Debra Butler    MR#:  [id number]  DATE OF BIRTH:  06/11/1921  SUBJECTIVE:  CHIEF COMPLAINT:   Chief Complaint  Patient presents with  . GI Bleeding   Sent from her assisted living place for episode of GI bleed.  Hemoglobin is stable.  She has been going downhill and her strength for last few weeks.  She noted to have sacral decubitus ulcer which seems to be infected.  She is on comfort care.  Patient was sleepy today and did not wake up much.  REVIEW OF SYSTEMS:   Due to altered mental status and drowsiness patient cannot give reliable review of system. ROS  DRUG ALLERGIES:   Allergies  Allergen Reactions  . Sulphur [Sulfur] Hives    And seizure  . Ammonia   . Phenergan [Promethazine] Other (See Comments)    Altered mental status    VITALS:  Blood pressure 130/69, pulse 90, temperature 97.8 F (36.6 C), temperature source Oral, resp. rate 16, height 5\' 4"  (1.626 m), weight 88.3 kg, SpO2 100 %.  PHYSICAL EXAMINATION:   GENERAL:  85 y.o.-year-old patient lying in the bed with no acute distress.  EYES: Pupils equal, round, reactive to light and accommodation. No scleral icterus. Extraocular muscles intact.  HEENT: Head atraumatic, normocephalic. Oropharynx and nasopharynx clear.  NECK: Supple, no jugular venous distention. No thyroid enlargement, no tenderness.  LUNGS: Normal breath sounds bilaterally, no wheezing, rales,rhonchi or crepitation. No use of accessory muscles of respiration.  CARDIOVASCULAR: S1, S2 normal. No murmurs, rubs, or gallops.  ABDOMEN: Soft, nontender, nondistended. Bowel sounds present. No organomegaly or mass.  EXTREMITIES: No pedal edema, cyanosis, or clubbing.  NEUROLOGIC: Patient is alert cannot give review of system or cannot exam for strength and she does not follow command. PSYCHIATRIC: The patient is sleepy today. SKIN: Multiple pressure ulcers, venous statis and skin  breakdown   Physical Exam LABORATORY PANEL:   CBC Recent Labs  Lab 03/12/19 0424  WBC 12.2*  HGB 7.8*  HCT 23.9*  PLT 162   ------------------------------------------------------------------------------------------------------------------  Chemistries  Recent Labs  Lab 03/10/19 1204  03/12/19 0424  NA 136   < > 140  K 3.2*   < > 2.9*  CL 102   < > 102  CO2 27   < > 24  GLUCOSE 142*   < > 167*  BUN 26*   < > 27*  CREATININE 1.55*   < > 1.67*  CALCIUM 7.3*   < > 7.9*  MG  --    < > 2.2  AST 19  --   --   ALT 7  --   --   ALKPHOS 51  --   --   BILITOT 1.4*  --   --    < > = values in this interval not displayed.   ------------------------------------------------------------------------------------------------------------------  Cardiac Enzymes No results for input(s): TROPONINI in the last 168 hours. ------------------------------------------------------------------------------------------------------------------  RADIOLOGY:  No results found.  ASSESSMENT AND PLAN:   Active Problems:   Sacral ulcer, with necrosis of muscle ([place])  85 y.o. female  with pertinent past medical history of diabetes mellitus, hypertension, atrial fibrillation, IBS, MI, bilateral lower extremity lymphedema, venous stasis ulcers of bilateral lower legs, traumatic hematoma of buttock now with sacral abscess presenting to the ED due to a large amount of rectal bleeding and pain in her sacral decubitus wound.  1. Anemia due to blood loss from sacral  decubitus ulcers - No other sources of bleeding identified - Hemoglobin drop 9.7-8.5 - H&H monitoring q6h - Hold NSAIDs, steroids, ASA - Hold Coumadin for now  - Because of old age, I would like to hold on GI consult or further work-up at this time. -Hemoglobin dropped some further but patient is now comfort care after meeting with palliative care and POA.  2. Left sacral decubitus ulcers with necrosis - CT suggests proctitis as well as  some cellulitis surrounding his sacral wound - Blood cultures pending - Started empiric antibiotics - We had called general surgery consult but patient's POA had decided to make her comfort care and no aggressive or surgical approach so no further needs.  3. Paroxysmal atrial fibrillation-rate controlled on diltiazem - Hold Coumadin for now  4. Hypokalemia - Replete with IV potassium - Check mag - Comfort care and awaited for hospice now.  5. Diabetes mellitus - Hold glipizide and Metformin - SSI  6. DVT prophylaxis - Hold anti-coagulation for suspect bleeding  7.  Functional decline After meeting with palliative care, POA agreed on comfort measures and hospice placement.  All the records are reviewed and case discussed with Care Management/Social Workerr. Management plans discussed with the patient, family and they are in agreement.  CODE STATUS: DNR  TOTAL TIME TAKING CARE OF THIS PATIENT: 25 minutes.  No beds in Hospice home. Waiting.   POSSIBLE D/C IN 1-2 DAYS, DEPENDING ON CLINICAL CONDITION.   [name] M.D on 03/16/2019   Between 7am to 6pm - Pager - [phone]  After 6pm go to [email or web address] - password [email or web address] [facility]  Office  [phone]  CC: Primary care physician; Lenard Simmer, MD  Note: This dictation was prepared with Dragon dictation along with smaller phrase technology. Any transcriptional errors that result from this process are unintentional.

## 2019-03-16 NOTE — Progress Notes (Signed)
Sleeping soundly no signs of pain or discomfort.

## 2019-03-17 NOTE — Progress Notes (Signed)
Nutrition Brief Follow-Up Note  Chart reviewed. Patient has now transitioned to comfort care.   No further nutrition interventions warranted at this time. Please re-consult RD as needed.   Joliyah Lippens, MS, RD, LDN Office: [phone] Pager: [phone] After Hours/Weekend Pager: [phone]    

## 2019-03-17 NOTE — Progress Notes (Signed)
Patient lying in bed with eyes closed. Refused oral care and to eat breakfast, no medications given d/t excessive sleepiness. C/o being cold , repositioned and covered with extra blanket.  Denies pain . Offered medication d/t states " I feel sick on my stomach".Marland KitchenMarland KitchenRefused medication at this time , HOB at 66

## 2019-03-17 NOTE — Progress Notes (Signed)
Daily Progress Note   Patient Name: Debra Butler       Date: 03/17/2019 DOB: 20-May-1922  Age: 85 y.o. MRN#: [id number] Attending Physician: [name], * Primary Care Physician: Lenard Simmer, MD Admit Date: 03/10/2019  Reason for Consultation/Follow-up: Terminal Care  Subjective: Patient is resting in bed with eyes closed. She has been refusing mouth care and medication from nursing.  HPOA to bedside. She speaks to patient and patient endorses back pain. Patient willing to be repositioned, and have pain and nausea medication from staff. Poor appetite.    Length of Stay: 7  Current Medications: Scheduled Meds:  . clorazepate  3.75 mg Oral Daily  . diltiazem  240 mg Oral Daily  . feeding supplement (ENSURE ENLIVE)  237 mL Oral BID BM  . levothyroxine  112 mcg Oral QAC breakfast  . pregabalin  75 mg Oral BID  . sodium chloride flush  3 mL Intravenous Q12H    Continuous Infusions:   PRN Meds: antiseptic oral rinse, glycopyrrolate **OR** glycopyrrolate **OR** glycopyrrolate, haloperidol **OR** haloperidol **OR** haloperidol lactate, LORazepam **OR** LORazepam **OR** LORazepam, metoprolol tartrate, morphine injection, nitroGLYCERIN, ondansetron **OR** ondansetron (ZOFRAN) IV, oxyCODONE, phenylephrine-shark liver oil-mineral oil-petrolatum, polyvinyl alcohol, sodium chloride flush  Physical Exam Constitutional:      Comments: She awakens and answers direct questions. She is sleepy.   Pulmonary:     Effort: Pulmonary effort is normal.             Vital Signs: BP 113/77 (BP Location: Left Arm)   Pulse 87   Temp 98 F (36.7 C) (Oral)   Resp 17   Ht 5\' 4"  (1.626 m)   Wt 88.3 kg   SpO2 100%   BMI 33.41 kg/m  SpO2: SpO2: 100 % O2 Device: O2 Device: Nasal  Cannula O2 Flow Rate: O2 Flow Rate (L/min): 2 L/min  Intake/output summary:   Intake/Output Summary (Last 24 hours) at 03/17/2019 1407 Last data filed at 03/17/2019 0026 Gross per 24 hour  Intake -  Output 300 ml  Net -300 ml   LBM: Last BM Date: 03/16/19 Baseline Weight: Weight: 91.2 kg Most recent weight: Weight: 88.3 kg       Palliative Assessment/Data: 30%    Flowsheet Rows     Most Recent Value  Intake Tab  Referral Department  Hospitalist  Unit at Time of Referral  Med/Surg Unit  Palliative Care Primary Diagnosis  Other (Comment) [Sacral ulcer, with necrosis of muscle]  Date Notified  03/11/19  Palliative Care Type  New Palliative care  Reason for referral  Clarify Goals of Care  Date of Admission  03/10/19  Date first seen by Palliative Care  03/12/19  # of days Palliative referral response time  1 Day(s)  # of days IP prior to Palliative referral  1  Clinical Assessment  Psychosocial & Spiritual Assessment  Palliative Care Outcomes      Patient Active Problem List   Diagnosis Date Noted  . Sacral ulcer, with necrosis of muscle ([place]) 03/10/2019  . Venous ulcer of left leg ([place]) 07/09/2018  . Venous ulcer of right leg ([place]) 06/10/2018  . Lower extremity pain, bilateral 11/20/2017  . Blister 10/31/2017  . Chest pain 09/06/2016  . Lower extremity edema 07/31/2016  . Diabetes ([place]) 04/03/2016  . Lymphedema 04/03/2016  . Swelling of limb 04/03/2016  . Hypertension 05/29/1986    Palliative Care Assessment & Plan    Recommendations/Plan:  Comfort care. Waiting for hospice facility.    Code Status:    Code Status Orders  (From admission, onward)         Start     Ordered   03/12/19 1406  Do not attempt resuscitation (DNR)  Continuous    Question Answer Comment  In the event of cardiac or respiratory ARREST Do not call a "code blue"   In the event of cardiac or respiratory ARREST Do not perform Intubation, CPR, defibrillation or ACLS   In the  event of cardiac or respiratory ARREST Use medication by any route, position, wound care, and other measures to relive pain and suffering. May use oxygen, suction and manual treatment of airway obstruction as needed for comfort.   Comments MOST form in chart      03/12/19 1420        Code Status History    Date Active Date Inactive Code Status Order ID Comments User Context   03/10/2019 2245 03/12/2019 1420 DNR [id number]  Lang Snow, NP ED   09/06/2016 1850 09/08/2016 2020 Partial Code [id number]  Demetrios Loll, MD Inpatient   Advance Care Planning Activity    Advance Directive Documentation     Most Recent Value  Type of Advance Directive  Living will, Healthcare Power of Attorney  Pre-existing out of facility DNR order (yellow form or pink MOST form)  -  "MOST" Form in Place?  -       Prognosis:   < 2 weeks  Discharge Planning:  Hospice facility   Thank you for allowing the Palliative Medicine Team to assist in the care of this patient.   Total Time 15 min Prolonged Time Billed no      Greater than 50%  of this time was spent counseling and coordinating care related to the above assessment and plan.  Asencion Gowda, NP  Please contact Palliative Medicine Team phone at [phone] for questions and concerns.

## 2019-03-17 NOTE — Progress Notes (Signed)
Patients POA in room , able to get patient to allow this writer to give PRN medication. C/o pain on right side. Given PRN Morphine and Zofran through IV . Patient agreed to eat some vanilla ice cream . Provided to " Lelon Frohlich". Will continue to monitor and reposition Q 2 hours

## 2019-03-17 NOTE — TOC Progression Note (Addendum)
Transition of Care [facility]) - Progression Note    Patient Details  Name: Debra Butler MRN: [id number] Date of Birth: Oct 08, 1921  Transition of Care [facility] CM/SW Contact  Tayvian [place], Lenice Llamas Phone Number: [phone]  03/17/2019, 10:43 AM  Clinical Narrative: Per [place] liaison there are no beds available today at the hospice facility in [place].   3 pm: Per Santiago Glad they will have a bed tomorrow at [facility] [place]. MD aware of above.      Expected Discharge Plan: [place] Barriers to Discharge: Hospice Bed not available  Expected Discharge Plan and Services Expected Discharge Plan: [place]   Discharge Planning Services: CM Consult, Other - See comment([facility]) Post Acute Care Choice: Hospice Living arrangements for the past 2 months: Assisted Living Facility                 DME Arranged: N/A         HH Arranged: NA           Social Determinants of Health (SDOH) Interventions    Readmission Risk Interventions Readmission Risk Prevention Plan 03/12/2019  Transportation Screening Complete  PCP or Specialist Appt within 3-5 Days Complete  HRI or [place] Complete  Social Work Consult for [place] Planning/Counseling Complete  Palliative Care Screening Complete  Medication Review [employer]) Referral to Pharmacy  Some recent data might be hidden

## 2019-03-17 NOTE — Care Management Important Message (Signed)
Important Message  Patient Details  Name: Debra Butler MRN: [id number] Date of Birth: 1921/08/08   Medicare Important Message Given:  Yes     [name] 03/17/2019, 10:50 AM

## 2019-03-17 NOTE — Progress Notes (Addendum)
Follow up visit made to new referral for [name] hospice home. Patient seen lying in bed, eyes closed, loud breathing noted. Patient did open her eyes briefly to her name and gentle touch. Hands cold and placed under her blanket, patient back to sleep and appeared to be back asleep. Per chart note review patient did not take any of her oral medications or eat breakfast.  A bed has become available at the hospice home for transfer tomorrow. Writer has updated CSW [employer] and attending physician Dr. Anselm Jungling. Telephone call placed to patient's Debra Butler with message left, awaiting a return call. Will continue to follow.  Update: Return telephone call received from [facility], she remains in agreement for Debra Butler to transfer to the hospice home.  Planned transfer tomorrow 10/20. Hospital care team updated.  Flo Shanks BSN, RN, [facility] [phone]

## 2019-03-17 NOTE — Progress Notes (Signed)
[facility] NAME: Debra Butler    MR#:  [id number]  DATE OF BIRTH:  1921-10-11  SUBJECTIVE:  CHIEF COMPLAINT:   Chief Complaint  Patient presents with  . GI Bleeding   Sent from her assisted living place for episode of GI bleed.  Hemoglobin is stable.  She has been going downhill and her strength for last few weeks.  She noted to have sacral decubitus ulcer which seems to be infected.  She is on comfort care.  Patient was sleepy today and did not wake up much.  REVIEW OF SYSTEMS:   Due to altered mental status and drowsiness patient cannot give reliable review of system. ROS  DRUG ALLERGIES:   Allergies  Allergen Reactions  . Sulphur [Sulfur] Hives    And seizure  . Ammonia   . Phenergan [Promethazine] Other (See Comments)    Altered mental status    VITALS:  Blood pressure 113/77, pulse 87, temperature 98 F (36.7 C), temperature source Oral, resp. rate 17, height 5\' 4"  (1.626 m), weight 88.3 kg, SpO2 100 %.  PHYSICAL EXAMINATION:   GENERAL:  85 y.o.-year-old patient lying in the bed with no acute distress.  EYES: Pupils equal, round, reactive to light and accommodation. No scleral icterus. Extraocular muscles intact.  HEENT: Head atraumatic, normocephalic. Oropharynx and nasopharynx clear.  NECK: Supple, no jugular venous distention. No thyroid enlargement, no tenderness.  LUNGS: Normal breath sounds bilaterally, no wheezing, rales,rhonchi or crepitation. No use of accessory muscles of respiration.  CARDIOVASCULAR: S1, S2 normal. No murmurs, rubs, or gallops.  ABDOMEN: Soft, nontender, nondistended. Bowel sounds present. No organomegaly or mass.  EXTREMITIES: No pedal edema, cyanosis, or clubbing.  NEUROLOGIC: Patient is alert cannot give review of system or cannot exam for strength and she does not follow command. PSYCHIATRIC: The patient is sleepy today. SKIN: Multiple pressure ulcers, venous statis and skin  breakdown   Physical Exam LABORATORY PANEL:   CBC Recent Labs  Lab 03/12/19 0424  WBC 12.2*  HGB 7.8*  HCT 23.9*  PLT 162   ------------------------------------------------------------------------------------------------------------------  Chemistries  Recent Labs  Lab 03/12/19 0424  NA 140  K 2.9*  CL 102  CO2 24  GLUCOSE 167*  BUN 27*  CREATININE 1.67*  CALCIUM 7.9*  MG 2.2   ------------------------------------------------------------------------------------------------------------------  Cardiac Enzymes No results for input(s): TROPONINI in the last 168 hours. ------------------------------------------------------------------------------------------------------------------  RADIOLOGY:  No results found.  ASSESSMENT AND PLAN:   Active Problems:   Sacral ulcer, with necrosis of muscle ([place])  85 y.o. female  with pertinent past medical history of diabetes mellitus, hypertension, atrial fibrillation, IBS, MI, bilateral lower extremity lymphedema, venous stasis ulcers of bilateral lower legs, traumatic hematoma of buttock now with sacral abscess presenting to the ED due to a large amount of rectal bleeding and pain in her sacral decubitus wound.  1. Anemia due to blood loss from sacral decubitus ulcers - No other sources of bleeding identified - Hemoglobin drop 9.7-8.5 - Hold NSAIDs, steroids, ASA - Hold Coumadin for now  - Because of old age, I would like to hold on GI consult or further work-up at this time. -Hemoglobin dropped some further but patient is now comfort care after meeting with palliative care and POA.  2. Left sacral decubitus ulcers with necrosis - CT suggests proctitis as well as some cellulitis surrounding his sacral wound - Started empiric antibiotics - We had called general surgery consult but patient's POA had decided  to make her comfort care and no aggressive or surgical approach so no further needs.  3. Paroxysmal atrial  fibrillation-rate controlled on diltiazem - Hold Coumadin for now  4. Hypokalemia - Replete with IV potassium - Comfort care and awaited for hospice now.  5. Diabetes mellitus - Hold glipizide and Metformin - SSI  6. DVT prophylaxis - Hold anti-coagulation for suspect bleeding  7.  Functional decline After meeting with palliative care, POA agreed on comfort measures and hospice placement.  All the records are reviewed and case discussed with Care Management/Social Workerr. Management plans discussed with the patient, family and they are in agreement.  CODE STATUS: DNR  TOTAL TIME TAKING CARE OF THIS PATIENT: 25 minutes.  No beds in Hospice home. Waiting.   POSSIBLE D/C IN 1-2 DAYS, DEPENDING ON CLINICAL CONDITION.   [name] M.D on 03/17/2019   Between 7am to 6pm - Pager - [phone]  After 6pm go to [email or web address] - password [email or web address] [facility]  Office  [phone]  CC: Primary care physician; Lenard Simmer, MD  Note: This dictation was prepared with Dragon dictation along with smaller phrase technology. Any transcriptional errors that result from this process are unintentional.

## 2019-03-18 MED ORDER — MORPHINE SULFATE (PF) 2 MG/ML IV SOLN: 1.0000 mg | INTRAVENOUS | 0 refills | Status: AC | PRN

## 2019-03-18 MED ORDER — OXYCODONE HCL 5 MG PO TABS: 5.0000 mg | ORAL_TABLET | ORAL | 0 refills | Status: AC | PRN

## 2019-03-18 MED ORDER — HALOPERIDOL 0.5 MG PO TABS: 0.5000 mg | ORAL_TABLET | ORAL | 0 refills | Status: AC | PRN

## 2019-03-18 MED ORDER — HALOPERIDOL 0.5 MG PO TABS: 0.5000 mg | ORAL_TABLET | ORAL | 0 refills | Status: DC | PRN

## 2019-03-18 MED ORDER — LORAZEPAM 2 MG/ML PO CONC: 1.0000 mg | ORAL | 0 refills | Status: AC | PRN

## 2019-03-18 MED ORDER — HALOPERIDOL LACTATE 2 MG/ML PO CONC: 0.5000 mg | ORAL | 0 refills | Status: AC | PRN

## 2019-03-18 MED ORDER — LORAZEPAM 2 MG/ML IJ SOLN: 0.5000 mg | INTRAMUSCULAR | 0 refills | Status: AC | PRN

## 2019-03-18 MED ORDER — ONDANSETRON 4 MG PO TBDP
4.0000 mg | ORAL_TABLET | Freq: Once | ORAL | Status: DC
  Filled 2019-03-18: qty 1

## 2019-03-18 MED ORDER — NITROGLYCERIN 0.4 MG SL SUBL: 0.4000 mg | SUBLINGUAL_TABLET | SUBLINGUAL | 0 refills | Status: AC | PRN

## 2019-03-18 MED ORDER — GLYCOPYRROLATE 1 MG PO TABS: 1.0000 mg | ORAL_TABLET | ORAL | 0 refills | Status: AC | PRN

## 2019-03-18 MED ORDER — ONDANSETRON 4 MG PO TBDP: 4.0000 mg | ORAL_TABLET | Freq: Three times a day (TID) | ORAL | 0 refills | Status: AC | PRN

## 2019-03-18 MED ORDER — LORAZEPAM 1 MG PO TABS: 1.0000 mg | ORAL_TABLET | ORAL | 0 refills | Status: AC | PRN

## 2019-03-18 MED ORDER — PREGABALIN 75 MG PO CAPS: 75.0000 mg | ORAL_CAPSULE | Freq: Two times a day (BID) | ORAL | 0 refills | Status: AC

## 2019-03-18 NOTE — Discharge Summary (Addendum)
Debra Butler NAME: Debra Butler    MR#:  [id number]  DATE OF BIRTH:  Sep 28, 1921  DATE OF ADMISSION:  03/10/2019 ADMITTING PHYSICIAN: Lang Snow, NP  DATE OF DISCHARGE: 03/18/2019   PRIMARY CARE PHYSICIAN: Lenard Simmer, MD    ADMISSION DIAGNOSIS:  Rectal bleeding [K62.5] Atrial fibrillation, unspecified type ([place]) [I48.91] Pressure injury of sacral region, stage 3 ([place]) [L89.153]  DISCHARGE DIAGNOSIS:  Active Problems:   Sacral ulcer, with necrosis of muscle ([place])   SECONDARY DIAGNOSIS:   Past Medical History:  Diagnosis Date  . Arthritis   . Blood clot in vein 2013   left leg  . Diabetes mellitus without complication ([employer]) 0000000  . Hypertension 1988  . IBS (irritable bowel syndrome)   . Myocardial infarction [facility])     HOSPITAL COURSE:   85 y.o.femalewith pertinent past medical history ofdiabetes mellitus, hypertension,atrial fibrillation,IBS, MI, bilateral lower extremity lymphedema, venous stasis ulcers of bilateral lower legs, traumatic hematoma of buttock now with sacral abscess presenting to the ED due to a large amount of rectal bleeding and pain in her sacral decubitus wound.  1.Anemia due to blood loss from sacral decubitus ulcers- No other sources of bleeding identified -Hemoglobin drop 9.7-8.5 - Hold NSAIDs, steroids, ASA -Hold Coumadin for now  - Because of old age, I would like to hold on GI consult or further work-up at this time. -Hemoglobin dropped some further but patient is now comfort care after meeting with palliative care and POA.  2.Left sacral decubitus ulcers with necrosis -CT suggests proctitis as well as some cellulitis surrounding his sacral wound -Started empiric antibiotics -We had called general surgery consult but patient's POA had decided to make her comfort care and no aggressive or surgical approach so no further needs.  3.Paroxysmal  atrial fibrillation-rate controlled on diltiazem -Hold Coumadin for now  4.Hypokalemia -Replete with IV potassium -Comfort care and awaited for hospice now.  5.Diabetes mellitus -Hold glipizide and Metformin - SSI  6.DVT prophylaxis - Hold anti-coagulation forsuspect bleeding  7.  Functional decline After meeting with palliative care, POA agreed on comfort measures and hospice placement.  DISCHARGE CONDITIONS:   Stable.  CONSULTS OBTAINED:    DRUG ALLERGIES:   Allergies  Allergen Reactions  . Sulphur [Sulfur] Hives    And seizure  . Ammonia   . Phenergan [Promethazine] Other (See Comments)    Altered mental status    DISCHARGE MEDICATIONS:   Allergies as of 03/18/2019      Reactions   Sulphur [sulfur] Hives   And seizure   Ammonia    Phenergan [promethazine] Other (See Comments)   Altered mental status      Medication List    STOP taking these medications   cholecalciferol 25 MCG (1000 UT) tablet Commonly known as: VITAMIN D   cyanocobalamin 1000 MCG/ML injection Commonly known as: (VITAMIN B-12)   furosemide 20 MG tablet Commonly known as: LASIX   glipiZIDE 5 MG 24 hr tablet Commonly known as: GLUCOTROL XL   levothyroxine 112 MCG tablet Commonly known as: SYNTHROID   metFORMIN 500 MG tablet Commonly known as: GLUCOPHAGE   potassium chloride SA 20 MEQ tablet Commonly known as: KLOR-CON   Preparation H 1-0.25-14.4-15 % Crea Generic drug: Pramox-PE-Glycerin-Petrolatum   warfarin 5 MG tablet Commonly known as: COUMADIN     TAKE these medications   aspirin 81 MG tablet Take 81 mg by mouth daily.   clorazepate 3.75 MG tablet  Commonly known as: TRANXENE Take 3.75 mg by mouth daily.   diltiazem 180 MG 24 hr capsule Commonly known as: DILACOR XR Take 180 mg by mouth daily.   glycopyrrolate 1 MG tablet Commonly known as: ROBINUL Take 1 tablet (1 mg total) by mouth every 4 (four) hours as needed (excessive secretions).    haloperidol 0.5 MG tablet Commonly known as: HALDOL Take 1 tablet (0.5 mg total) by mouth every 4 (four) hours as needed for agitation (or delirium).   haloperidol 2 MG/ML solution Commonly known as: HALDOL Place 0.3 mLs (0.6 mg total) under the tongue every 4 (four) hours as needed for agitation (or delirium).   LORazepam 1 MG tablet Commonly known as: ATIVAN Take 1 tablet (1 mg total) by mouth every 4 (four) hours as needed for anxiety.   LORazepam 2 MG/ML concentrated solution Commonly known as: ATIVAN Place 0.5 mLs (1 mg total) under the tongue every 4 (four) hours as needed for anxiety.   LORazepam 2 MG/ML injection Commonly known as: ATIVAN Inject 0.25 mLs (0.5 mg total) into the vein every 4 (four) hours as needed for anxiety.   morphine 2 MG/ML injection Inject 0.5-1 mLs (1-2 mg total) into the vein every 2 (two) hours as needed (1mg  for severe pain, 2mg  for breakthrough pain or for dyspnea).   nitroGLYCERIN 0.4 MG SL tablet Commonly known as: NITROSTAT Place 1 tablet (0.4 mg total) under the tongue every 5 (five) minutes as needed for chest pain.   ondansetron 4 MG disintegrating tablet Commonly known as: ZOFRAN-ODT Take 1 tablet (4 mg total) by mouth every 8 (eight) hours as needed for nausea or vomiting.   oxyCODONE 5 MG immediate release tablet Commonly known as: Oxy IR/ROXICODONE Take 1 tablet (5 mg total) by mouth every 3 (three) hours as needed for moderate pain or severe pain.   pregabalin 75 MG capsule Commonly known as: LYRICA Take 1 capsule (75 mg total) by mouth 2 (two) times daily. What changed:   medication strength  how much to take        DISCHARGE INSTRUCTIONS:    Follow for comfort with hospice.  If you experience worsening of your admission symptoms, develop shortness of breath, life threatening emergency, suicidal or homicidal thoughts you must seek medical attention immediately by calling 911 or calling your MD immediately  if  symptoms less severe.  You Must read complete instructions/literature along with all the possible adverse reactions/side effects for all the Medicines you take and that have been prescribed to you. Take any new Medicines after you have completely understood and accept all the possible adverse reactions/side effects.   Please note  You were cared for by a hospitalist during your hospital stay. If you have any questions about your discharge medications or the care you received while you were in the hospital after you are discharged, you can call the unit and asked to speak with the hospitalist on call if the hospitalist that took care of you is not available. Once you are discharged, your primary care physician will handle any further medical issues. Please note that NO REFILLS for any discharge medications will be authorized once you are discharged, as it is imperative that you return to your primary care physician (or establish a relationship with a primary care physician if you do not have one) for your aftercare needs so that they can reassess your need for medications and monitor your lab values.    Today   CHIEF COMPLAINT:  Chief Complaint  Patient presents with  . GI Bleeding    HISTORY OF PRESENT ILLNESS:  Debra Butler  is a 85 y.o. female with a known history of diabetes mellitus, hypertension, IBS, MI, bilateral lower extremity lymphedema, venous stasis ulcers of bilateral lower legs, traumatic hematoma of buttock now with sacral abscess presenting to the ED due to a large amount of rectal bleeding and pain in her sacral decubitus wound.  Patient is from [facility] assisted living, complain today of not feeling well. Per facility, she had "rectal bleed that was significant yesterday " but none noted today.  She does have decubitus sacral ulcer that is painful.  Patient describes symptoms of pain at the buttock and tailbone with bloody drainage. She does report pain  worse with changing dressing, changing position, pressure, movement and sitting. Nothing seems to make pain better.  Patient is followed by [facility] wound management and was last seen on 02/19/2019 for dressing changes.  Patient was brought into the ED today due to worsening pain in her sacral area and generalized fatigue.  On arrival to the ED, she was afebrile with blood pressure122/69 mm Hg and pulse rate 89 beats/min. There were no focal neurological deficits; she was alert and oriented x4, and she did not demonstrate any memory deficits.  Initial labs revealed potassium 3.2, BUN 26 creatinine 1.55, 1.4,wbc 9.5, hemoglobin 8.5 dropped from 9.7, MCV 106.6, hematocrit 25.6.  CT abdomen and pelvis shows possible cellulitis with mild scale breakdown over the left gluteal region.  Hospitalist asked to admit for further management.   VITAL SIGNS:  Blood pressure 110/64, pulse 88, temperature 98.3 F (36.8 C), temperature source Axillary, resp. rate 16, height 5\' 4"  (1.626 m), weight 88.3 kg, SpO2 100 %.  I/O:    Intake/Output Summary (Last 24 hours) at 03/18/2019 1336 Last data filed at 03/18/2019 1132 Gross per 24 hour  Intake 0 ml  Output 760 ml  Net -760 ml    PHYSICAL EXAMINATION:   GENERAL:85 y.o.-year-old patient lying in the bed with no acute distress.  EYES: Pupils equal, round, reactive to light and accommodation. No scleral icterus. Extraocular muscles intact.  HEENT: Head atraumatic, normocephalic. Oropharynx and nasopharynx clear.  NECK: Supple, no jugular venous distention. No thyroid enlargement, no tenderness.  LUNGS: Normal breath sounds bilaterally, no wheezing, rales,rhonchi or crepitation. No use of accessory muscles of respiration.  CARDIOVASCULAR: S1, S2 normal. No murmurs, rubs, or gallops.  ABDOMEN: Soft, nontender, nondistended. Bowel sounds present. No organomegaly or mass.  EXTREMITIES: No pedal edema, cyanosis, or clubbing.  NEUROLOGIC: Patient is alert cannot  give review of system or cannot exam for strength and she does not follow command. PSYCHIATRIC: The patient is sleepy today. SKIN:Multiple pressure ulcers, venous statis and skin breakdown  DATA REVIEW:   CBC Recent Labs  Lab 03/12/19 0424  WBC 12.2*  HGB 7.8*  HCT 23.9*  PLT 162    Chemistries  Recent Labs  Lab 03/12/19 0424  NA 140  K 2.9*  CL 102  CO2 24  GLUCOSE 167*  BUN 27*  CREATININE 1.67*  CALCIUM 7.9*  MG 2.2    Cardiac Enzymes No results for input(s): TROPONINI in the last 168 hours.  Microbiology Results  Results for orders placed or performed during the hospital encounter of 03/10/19  SARS CORONAVIRUS 2 (TAT 6-24 HRS) Nasopharyngeal Nasopharyngeal Swab     Status: None   Collection Time: 03/10/19  3:23 PM   Specimen: Nasopharyngeal Swab  Result Value Ref Range Status   SARS Coronavirus 2 NEGATIVE NEGATIVE Final    Comment: (NOTE) SARS-CoV-2 target nucleic acids are NOT DETECTED. The SARS-CoV-2 RNA is generally detectable in upper and lower respiratory specimens during the acute phase of infection. Negative results do not preclude SARS-CoV-2 infection, do not rule out co-infections with other pathogens, and should not be used as the sole basis for treatment or other patient management decisions. Negative results must be combined with clinical observations, patient history, and epidemiological information. The expected result is Negative. Fact Sheet for Patients: SugarRoll.be Fact Sheet for Healthcare Providers: https://www.woods-mathews.com/ This test is not yet approved or cleared by the [place] FDA and  has been authorized for detection and/or diagnosis of SARS-CoV-2 by FDA under an Emergency Use Authorization (EUA). This EUA will remain  in effect (meaning this test can be used) for the duration of the COVID-19 declaration under Section 56 4(b)(1) of the Act, 21 U.S.C. section 360bbb-3(b)(1),  unless the authorization is terminated or revoked sooner. Performed at [facility], [place] [address] [place], [place] [address]   Wound or Superficial Culture     Status: None   Collection Time: 03/10/19  3:23 PM   Specimen: Decubitis; Wound  Result Value Ref Range Status   Specimen Description   Final    DECUBITIS Performed at [facility], [address], [place], [place] [address]    Special Requests   Final    NONE Performed at [facility], [address] [place], [place] [address]    Gram Stain   Final    MODERATE WBC PRESENT, PREDOMINANTLY PMN ABUNDANT GRAM POSITIVE COCCI ABUNDANT GRAM NEGATIVE RODS FEW GRAM POSITIVE RODS    Culture   Final    MODERATE MORGANELLA MORGANII FEW ACTINOMYCES SPECIES Standardized susceptibility testing for this organism is not available. Performed at [facility], [place] [address], [place], [place] [address]    Report Status 03/15/2019 FINAL  Final   Organism ID, Bacteria MORGANELLA MORGANII  Final      Susceptibility   Morganella morganii - MIC*    AMPICILLIN >=32 RESISTANT Resistant     CEFAZOLIN >=64 RESISTANT Resistant     CEFEPIME <=1 SENSITIVE Sensitive     CEFTAZIDIME >=64 RESISTANT Resistant     CEFTRIAXONE 8 SENSITIVE Sensitive     CIPROFLOXACIN >=4 RESISTANT Resistant     GENTAMICIN <=1 SENSITIVE Sensitive     IMIPENEM 1 SENSITIVE Sensitive     TRIMETH/SULFA 160 RESISTANT Resistant     AMPICILLIN/SULBACTAM >=32 RESISTANT Resistant     PIP/TAZO <=4 SENSITIVE Sensitive     * MODERATE MORGANELLA MORGANII  Blood Cultures x 2 sites     Status: None   Collection Time: 03/11/19  2:44 AM   Specimen: BLOOD  Result Value Ref Range Status   Specimen Description BLOOD RIGHT ASSIST CONTROL  Final   Special Requests   Final    BOTTLES DRAWN AEROBIC ONLY Blood Culture adequate volume   Culture   Final    NO GROWTH 5 DAYS Performed at [facility], [address] [place], [place] [address]     Report Status 03/16/2019 FINAL  Final  Blood Cultures x 2 sites     Status: None   Collection Time: 03/11/19  2:44 AM   Specimen: BLOOD  Result Value Ref Range Status   Specimen Description BLOOD LEFT ASSIST CONTROL  Final   Special Requests   Final  BOTTLES DRAWN AEROBIC ONLY Blood Culture adequate volume   Culture   Final    NO GROWTH 5 DAYS Performed at [facility], [address] [place], [place] [address]    Report Status 03/16/2019 FINAL  Final  MRSA PCR Screening     Status: None   Collection Time: 03/11/19  3:20 AM   Specimen: Nasopharyngeal  Result Value Ref Range Status   MRSA by PCR NEGATIVE NEGATIVE Final    Comment:        The GeneXpert MRSA Assay (FDA approved for NASAL specimens only), is one component of a comprehensive MRSA colonization surveillance program. It is not intended to diagnose MRSA infection nor to guide or monitor treatment for MRSA infections. Performed at [facility], [address], [place], [place] [address]     RADIOLOGY:  No results found.  EKG:   Orders placed or performed during the hospital encounter of 03/10/19  . EKG 12-Lead  . EKG 12-Lead      Management plans discussed with the patient, family and they are in agreement.  CODE STATUS: DNR    Code Status Orders  (From admission, onward)         Start     Ordered   03/12/19 1406  Do not attempt resuscitation (DNR)  Continuous    Question Answer Comment  In the event of cardiac or respiratory ARREST Do not call a "code blue"   In the event of cardiac or respiratory ARREST Do not perform Intubation, CPR, defibrillation or ACLS   In the event of cardiac or respiratory ARREST Use medication by any route, position, wound care, and other measures to relive pain and suffering. May use oxygen, suction and manual treatment of airway obstruction as needed for comfort.   Comments MOST form in chart      03/12/19 1420        Code Status History     Date Active Date Inactive Code Status Order ID Comments User Context   03/10/2019 2245 03/12/2019 1420 DNR [id number]  Lang Snow, NP ED   09/06/2016 1850 09/08/2016 2020 Partial Code [id number]  Demetrios Loll, MD Inpatient   Advance Care Planning Activity    Advance Directive Documentation     Most Recent Value  Type of Advance Directive  Living will, Healthcare Power of Attorney  Pre-existing out of facility DNR order (yellow form or pink MOST form)  -  "MOST" Form in Place?  -      TOTAL TIME TAKING CARE OF THIS PATIENT: 35 minutes.    [name] M.D on 03/18/2019 at 1:36 PM  Between 7am to 6pm - Pager - [phone]  After 6pm go to [email or web address] - password [email or web address] [facility]  Office  [phone]  CC: Primary care physician; Lenard Simmer, MD   Note: This dictation was prepared with Dragon dictation along with smaller phrase technology. Any transcriptional errors that result from this process are unintentional.

## 2019-03-18 NOTE — Progress Notes (Signed)
Follow up visit made to new hospice home referral. Patient seen lying in bed, awake, oriented to self. Explained to patient that a bed is available and she will be transferring today. She asked "what for", writer explained it was for her to be cared for. Report called to the hospice home, EMS notified for transport, hospital care team updated. HCPOA aware of transport and is at the hospice home.  Flo Shanks BSN, RN, [facility] [phone]

## 2019-03-18 NOTE — TOC Transition Note (Signed)
[facility] - CM/SW Discharge Note   Patient Details  Name: FUTURE HAUS MRN: [id number] Date of Birth: 11-Sep-1921  Transition [facility]) CM/SW Contact:  Su Hilt, RN Phone Number: 03/18/2019, 10:21 AM   Clinical Narrative:     The patient will Discharge to the [facility] today via EMS transport.   Spoke with Santiago Glad with [facility] to confirm, Santiago Glad will call report and call EMS for transport when ready, Papers are printed and on the chart for transport.  Santiago Glad to make Lelon Frohlich aware    Final next level of care: [place] Barriers to Discharge: Barriers Resolved   Patient Goals and CMS Choice Patient states their goals for this hospitalization and ongoing recovery are:: go to hospice CMS Medicare.gov Compare Post Acute Care list provided to:: Patient Choice offered to / list presented to : Patient, [facility] POA / [place]  Discharge Placement                       Discharge Plan and Services   Discharge Planning Services: CM Consult, Other - See comment([facility] home) Post Acute Care Choice: Hospice          DME Arranged: N/A         HH Arranged: NA          Social Determinants of Health (SDOH) Interventions     Readmission Risk Interventions Readmission Risk Prevention Plan 03/12/2019  Transportation Screening Complete  PCP or Specialist Appt within 3-5 Days Complete  HRI or [place] Complete  Social Work Consult for [place] Planning/Counseling Complete  Palliative Care Screening Complete  Medication Review [employer]) Referral to Pharmacy  Some recent data might be hidden

## 2019-03-18 NOTE — Progress Notes (Signed)
Daily Progress Note   Patient Name: Debra Butler       Date: 03/18/2019 DOB: May 10, 1922  Age: 85 y.o. MRN#: [id number] Attending Physician: [name], * Primary Care Physician: Lenard Simmer, MD Admit Date: 03/10/2019  Reason for Consultation/Follow-up: Terminal Care  Subjective: Patient is resting in bed with nursing at the bedside. She complains of back pain. Nursing providing medication for pain at this time. Awaiting transport to hospice facility.   Length of Stay: 8  Current Medications: Scheduled Meds:  . clorazepate  3.75 mg Oral Daily  . diltiazem  240 mg Oral Daily  . feeding supplement (ENSURE ENLIVE)  237 mL Oral BID BM  . levothyroxine  112 mcg Oral QAC breakfast  . pregabalin  75 mg Oral BID  . sodium chloride flush  3 mL Intravenous Q12H    Continuous Infusions:   PRN Meds: antiseptic oral rinse, glycopyrrolate **OR** glycopyrrolate **OR** glycopyrrolate, haloperidol **OR** haloperidol **OR** haloperidol lactate, LORazepam **OR** LORazepam **OR** LORazepam, metoprolol tartrate, morphine injection, nitroGLYCERIN, ondansetron **OR** ondansetron (ZOFRAN) IV, oxyCODONE, phenylephrine-shark liver oil-mineral oil-petrolatum, polyvinyl alcohol, sodium chloride flush  Physical Exam Constitutional:      Comments: Alert but lethargic.   Pulmonary:     Effort: Pulmonary effort is normal.             Vital Signs: BP 110/64   Pulse 88   Temp 98.3 F (36.8 C) (Axillary)   Resp 16   Ht 5\' 4"  (1.626 m)   Wt 88.3 kg   SpO2 100%   BMI 33.41 kg/m  SpO2: SpO2: 100 % O2 Device: O2 Device: Nasal Cannula O2 Flow Rate: O2 Flow Rate (L/min): 2 L/min  Intake/output summary:   Intake/Output Summary (Last 24 hours) at 03/18/2019 1045 Last data filed at  03/18/2019 0900 Gross per 24 hour  Intake 0 ml  Output 600 ml  Net -600 ml   LBM: Last BM Date: 03/16/19 Baseline Weight: Weight: 91.2 kg Most recent weight: Weight: 88.3 kg       Palliative Assessment/Data: 30%    Flowsheet Rows     Most Recent Value  Intake Tab  Referral Department  Hospitalist  Unit at Time of Referral  Med/Surg Unit  Palliative Care Primary Diagnosis  Other (Comment) [Sacral ulcer, with necrosis of muscle]  Date Notified  03/11/19  Palliative Care Type  New Palliative care  Reason for referral  Clarify Goals of Care  Date of Admission  03/10/19  Date first seen by Palliative Care  03/12/19  # of days Palliative referral response time  1 Day(s)  # of days IP prior to Palliative referral  1  Clinical Assessment  Psychosocial & Spiritual Assessment  Palliative Care Outcomes      Patient Active Problem List   Diagnosis Date Noted  . Sacral ulcer, with necrosis of muscle ([place]) 03/10/2019  . Venous ulcer of left leg ([place]) 07/09/2018  . Venous ulcer of right leg ([place]) 06/10/2018  . Lower extremity pain, bilateral 11/20/2017  . Blister 10/31/2017  . Chest pain 09/06/2016  . Lower extremity edema 07/31/2016  . Diabetes ([place]) 04/03/2016  . Lymphedema 04/03/2016  . Swelling of limb 04/03/2016  . Hypertension 05/29/1986    Palliative Care Assessment & Plan    Recommendations/Plan:  Comfort care. Waiting for hospice facility.    Code Status:    Code Status Orders  (From admission, onward)         Start     Ordered   03/12/19 1406  Do not attempt resuscitation (DNR)  Continuous    Question Answer Comment  In the event of cardiac or respiratory ARREST Do not call a "code blue"   In the event of cardiac or respiratory ARREST Do not perform Intubation, CPR, defibrillation or ACLS   In the event of cardiac or respiratory ARREST Use medication by any route, position, wound care, and other measures to relive pain and suffering. May use oxygen,  suction and manual treatment of airway obstruction as needed for comfort.   Comments MOST form in chart      03/12/19 1420        Code Status History    Date Active Date Inactive Code Status Order ID Comments User Context   03/10/2019 2245 03/12/2019 1420 DNR [id number]  Lang Snow, NP ED   09/06/2016 1850 09/08/2016 2020 Partial Code [id number]  Demetrios Loll, MD Inpatient   Advance Care Planning Activity    Advance Directive Documentation     Most Recent Value  Type of Advance Directive  Living will, Healthcare Power of Attorney  Pre-existing out of facility DNR order (yellow form or pink MOST form)  -  "MOST" Form in Place?  -       Prognosis:   < 2 weeks  Discharge Planning:  Hospice facility   Thank you for allowing the Palliative Medicine Team to assist in the care of this patient.   Total Time 15 min Prolonged Time Billed no      Greater than 50%  of this time was spent counseling and coordinating care related to the above assessment and plan.  Asencion Gowda, NP  Please contact Palliative Medicine Team phone at [phone] for questions and concerns.

## 2019-03-18 NOTE — Progress Notes (Signed)
Report was given to Hospice. EMS transported pt to hospice. Pt denture container and one pair of socks was given to EMS to take with pt to hospice

## 2019-03-30 DEATH — deceased
